# Patient Record
Sex: Female | Born: 1965 | Race: White | Hispanic: No | Smoking: Never smoker
Health system: Southern US, Community
[De-identification: ages and names within clinical notes are randomized; demographics above are authoritative.]

## PROBLEM LIST (undated history)

## (undated) DIAGNOSIS — I10 Essential (primary) hypertension: Secondary | ICD-10-CM

## (undated) DIAGNOSIS — K5010 Crohn's disease of large intestine without complications: Secondary | ICD-10-CM

## (undated) DIAGNOSIS — E66813 Obesity, class 3: Secondary | ICD-10-CM

## (undated) DIAGNOSIS — K5000 Crohn's disease of small intestine without complications: Secondary | ICD-10-CM

## (undated) DIAGNOSIS — E785 Hyperlipidemia, unspecified: Secondary | ICD-10-CM

## (undated) DIAGNOSIS — R0600 Dyspnea, unspecified: Secondary | ICD-10-CM

## (undated) DIAGNOSIS — K5790 Diverticulosis of intestine, part unspecified, without perforation or abscess without bleeding: Secondary | ICD-10-CM

## (undated) DIAGNOSIS — E119 Type 2 diabetes mellitus without complications: Secondary | ICD-10-CM

## (undated) HISTORY — DX: Essential (primary) hypertension: I10

## (undated) HISTORY — PX: COLONOSCOPY: SHX174

## (undated) HISTORY — DX: Hyperlipidemia, unspecified: E78.5

## (undated) HISTORY — PX: LAPAROSCOPIC GASTRIC BANDING: SHX1100

## (undated) HISTORY — DX: Crohn's disease of small intestine without complications: K50.00

## (undated) HISTORY — PX: UMBILICAL HERNIA REPAIR: SUR1181

## (undated) HISTORY — PX: ABDOMINAL EXPLORATION SURGERY: SHX538

## (undated) HISTORY — PX: UPPER GASTROINTESTINAL ENDOSCOPY: SHX188

## (undated) HISTORY — DX: Morbid (severe) obesity due to excess calories: E66.01

## (undated) HISTORY — DX: Type 2 diabetes mellitus without complications: E11.9

## (undated) HISTORY — DX: Obesity, class 3: E66.813

## (undated) HISTORY — PX: CHOLECYSTECTOMY: SHX55

## (undated) HISTORY — DX: Diverticulosis of intestine, part unspecified, without perforation or abscess without bleeding: K57.90

---

## 2007-05-17 ENCOUNTER — Ambulatory Visit: Payer: Self-pay | Admitting: Cardiology

## 2008-06-05 ENCOUNTER — Ambulatory Visit (HOSPITAL_COMMUNITY): Admission: RE | Admit: 2008-06-05 | Discharge: 2008-06-05 | Payer: Self-pay | Admitting: *Deleted

## 2008-06-26 ENCOUNTER — Encounter: Admission: RE | Admit: 2008-06-26 | Discharge: 2008-06-26 | Payer: Self-pay | Admitting: *Deleted

## 2008-06-26 ENCOUNTER — Ambulatory Visit (HOSPITAL_COMMUNITY): Admission: RE | Admit: 2008-06-26 | Discharge: 2008-06-26 | Payer: Self-pay | Admitting: *Deleted

## 2009-04-09 ENCOUNTER — Encounter: Admission: RE | Admit: 2009-04-09 | Discharge: 2009-07-08 | Payer: Self-pay | Admitting: Surgery

## 2009-04-26 ENCOUNTER — Emergency Department (HOSPITAL_COMMUNITY): Admission: EM | Admit: 2009-04-26 | Discharge: 2009-04-26 | Payer: Self-pay | Admitting: Emergency Medicine

## 2009-04-28 ENCOUNTER — Ambulatory Visit (HOSPITAL_COMMUNITY): Admission: RE | Admit: 2009-04-28 | Discharge: 2009-04-29 | Payer: Self-pay | Admitting: Surgery

## 2009-05-05 ENCOUNTER — Inpatient Hospital Stay (HOSPITAL_COMMUNITY): Admission: EM | Admit: 2009-05-05 | Discharge: 2009-05-08 | Payer: Self-pay | Admitting: Emergency Medicine

## 2009-06-19 ENCOUNTER — Emergency Department (HOSPITAL_COMMUNITY): Admission: EM | Admit: 2009-06-19 | Discharge: 2009-06-19 | Payer: Self-pay | Admitting: Emergency Medicine

## 2009-07-28 ENCOUNTER — Encounter: Admission: RE | Admit: 2009-07-28 | Discharge: 2009-09-16 | Payer: Self-pay | Admitting: Surgery

## 2009-08-05 ENCOUNTER — Encounter: Admission: RE | Admit: 2009-08-05 | Discharge: 2009-08-05 | Payer: Self-pay | Admitting: Gastroenterology

## 2010-03-28 ENCOUNTER — Emergency Department (HOSPITAL_COMMUNITY): Admission: EM | Admit: 2010-03-28 | Discharge: 2010-03-29 | Payer: Self-pay | Admitting: Emergency Medicine

## 2010-06-19 DIAGNOSIS — K5790 Diverticulosis of intestine, part unspecified, without perforation or abscess without bleeding: Secondary | ICD-10-CM

## 2010-06-19 HISTORY — DX: Diverticulosis of intestine, part unspecified, without perforation or abscess without bleeding: K57.90

## 2010-06-19 HISTORY — PX: COLONOSCOPY: SHX174

## 2010-06-23 ENCOUNTER — Inpatient Hospital Stay (HOSPITAL_COMMUNITY): Admission: EM | Admit: 2010-06-23 | Discharge: 2010-06-26 | Payer: Self-pay | Admitting: Emergency Medicine

## 2010-06-24 ENCOUNTER — Ambulatory Visit: Payer: Self-pay | Admitting: Gastroenterology

## 2010-06-24 ENCOUNTER — Encounter (INDEPENDENT_AMBULATORY_CARE_PROVIDER_SITE_OTHER): Payer: Self-pay

## 2010-06-24 ENCOUNTER — Ambulatory Visit: Payer: Self-pay | Admitting: Internal Medicine

## 2010-07-26 ENCOUNTER — Encounter (INDEPENDENT_AMBULATORY_CARE_PROVIDER_SITE_OTHER): Payer: Self-pay | Admitting: *Deleted

## 2010-10-19 NOTE — Miscellaneous (Signed)
Summary: CONSULTATION  Clinical Lists Changes  NAME:  [name] [name]                ACCOUNT NO.:  [id number]      MEDICAL RECORD NO.:  [id number]          PATIENT TYPE:  INP      LOCATION:  A334                          FACILITY:  [facility]      PHYSICIAN:  R. Roetta Sessions, M.D. DATE OF BIRTH:  17-Mar-1966      DATE OF CONSULTATION:  06/23/2010   DATE OF DISCHARGE:                                    CONSULTATION         REASON FOR CONSULTATION:  Ileitis.      PHYSICIAN REQUESTING CONSULTATION:  Elliot Cousin with [facility]   1 team.      PRIMARY CARE PHYSICIAN:  Dr. Sherryll Burger.      HISTORY OF PRESENT ILLNESS:  The patient is a 45 year old Caucasian   female with past medical history significant for laparoscopic gastric   band placement for weight loss back on April 28, 2009, status post   removal of incarcerated omentum stuck to the umbilical hernia on May 05, 2009, type 2 diabetes, hypertension, who presents the emergency   department with complaints of recurrent right-sided abdominal pain,   nausea and dry heaves, diarrhea.  The patient has had intermittent   episodes of this for over 1 year.  She tells me actually before her   gastric band placement that she had an exploratory laparoscopy by Dr.   Luretha Murphy for similar symptoms.  I do not have that report.  The   patient tells me that he found some inflamed of bowel which was felt to   be due to infection an at the time.  She has also had a total of four   CTs since August 2010 now, which demonstrate ileitis.  She had a   colonoscopy and EGD by Dr. Willis Modena back in December 2010 due to   these abnormal findings on the CT.  She found have diverticulosis of the   entire colon.  The report is somewhat hard to read but the distal   terminal ileum was evaluated appear to be normal.  Not sure for into the   distal terminal ileum was seen.  There was no findings to correlate with   the CT findings, however.  She had a  normal EGD to the third portion of   the duodenum.  The patient states that and 20 episode she does very well   without any abdominal pain or bowel issues.  She had normal.  She had   another episode back in July and she presented to the ED and an AP and   had another CT and again showed ileitis.  She believes she was treated   at one point with antibiotic therapy.      Basically she develops a right lower quadrant abdominal pain followed by   nausea and vomiting as well as diarrhea.  This time she has had dry   heaves has not been able to keep anything down or even attempt to take   p.o.'s.  She has had 2 to 3 loose stools daily.  Denies any blood in the   stool or melena.  On evaluation she had a CT of the head and pelvis with   contrast that showed inflammatory process at the terminal ileum with   bowel wall thickening, mesenteric edema, free intraperitoneal fluid, and   similar to prior study back in July.  Her white count slightly elevated   at 12,300.  Her hemoglobin was normal and lactic acid was 2.6 slightly   elevated.  Her glucose is 280.  Her LFTs were normal except albumin of   3.1, lipase normal.  HCG urine pregnancy test was negative.      MEDICATIONS AT HOME:   1. Lantus 26 units daily   2. Lisinopril 20 mg daily   3. Vitamin D 2000 units daily   4. Oral contraceptive pill daily.   5. Denies any NSAID or aspirin use.      ALLERGIES:  NO KNOWN DRUG ALLERGIES.      PAST MEDICAL HISTORY:   1. History of distal ileitis as outlined above.   2. Diabetes mellitus.   3. Hypertension.   4. Hyperlipidemia.   5. Vitamin D deficiency.   6. Again she reports an exploratory laparoscopy around April 26, 2009,       and reports having inflamed intestines at that time.  She had a lap       band placement on April 28, 2009.  On May 05, 2009, she had       open repair of an incarcerated umbilical hernia.  She had a remote       cholecystectomy in 1992.  EGD and colonoscopy in  December 2010 by       Dr. Dulce Sellar as outlined above.      FAMILY HISTORY:  Mother 66 and has a history diabetes, thyroid disease,   hypertension, hyperlipidemia and GERD.  Father is 50 and has history of   MI and hypertension.  No family history of colon cancer, IBD or liver   disease.      SOCIAL HISTORY:  She is married.  No children.  Denies tobacco, alcohol   or drug use.  She is employed with [employer].      REVIEW OF SYSTEMS:  As outlined above.  GENERAL:  She has lost about 85   pounds intentionally since a lap band placement.  CARDIOPULMONARY:   Denies chest pain, shortness of breath, palpitations or cough.   GENITOURINARY:  Denies any dysuria, hematuria.      PHYSICAL EXAM:  VITAL SIGNS:  Temperature 90.3, pulse 80, respirations   13, blood pressure 167/98.   GENERAL:  Pleasant obese Caucasian female in no acute distress.   SKIN:  Warm and dry.  No jaundice.   HEENT:  Sclerae nonicteric.  Oropharyngeal mucosa moist and pink.  No   lesions, erythema or exudate.  No lymphadenopathy, thyromegaly.  CHEST:   Lungs are clear to auscultation.   CARDIAC:  Exam reveals regular rate and rhythm.  No murmurs, rubs or   gallops.   ABDOMEN:  Obese, positive bowel sounds.  Lab band port notable above the   umbilicus and right of the midline.  She has tenderness throughout the   right mid to right lower quadrant.  No rebound or guarding.  No   organomegaly or masses.  No abdominal bruits or hernias.   LOWER EXTREMITIES:  No edema.      LABS:  As outlined above.  CT as outlined above.      IMPRESSION:  The patient is a 45 year old Caucasian female with history   of chronic ileitis seen on four CTs now since August of 2010 which   likely represents evolving Crohn disease.      RECOMMENDATIONS:  Colonoscopy with ileoscopy in the morning:  I like to   thank Dr. Sherrie Mustache for allowing me to take part in the care of this   patient.               Tana Coast, P.A.          ______________________________   R. Roetta Sessions, M.D.            LL/MEDQ  D:  06/23/2010  T:  06/23/2010  Job:  [id number]      cc:   Elliot Cousin, M.D.      Thornton Park Daphine Deutscher, MD   [address]   [place]   [place] [address]      Dr. Sherryll Burger      Electronically Signed by Tana Coast P.A. on 06/30/2010 08:05:58 AM   Electronically Signed by Lorrin Goodell M.D. on 07/26/2010 11:17:13 AM

## 2010-12-02 LAB — URINALYSIS, ROUTINE W REFLEX MICROSCOPIC
Ketones, ur: >80 mg/dL — AB
Nitrite: NEGATIVE
Protein, ur: NEGATIVE mg/dL
Urobilinogen, UA: 0.2 mg/dL (ref 0.0–1.0)

## 2010-12-02 LAB — CBC
Hemoglobin: 11 g/dL — ABNORMAL LOW (ref 12.0–15.0)
MCH: 28.5 pg (ref 26.0–34.0)
MCHC: 33.5 g/dL (ref 30.0–36.0)
MCHC: 33.7 g/dL (ref 30.0–36.0)
MCV: 84.4 fL (ref 78.0–100.0)
Platelets: 192 10*3/uL (ref 150–400)
RDW: 12.7 % (ref 11.5–15.5)
WBC: 8.9 10*3/uL (ref 4.0–10.5)

## 2010-12-02 LAB — GIARDIA/CRYPTOSPORIDIUM SCREEN(EIA)
Cryptosporidium Screen (EIA): NEGATIVE
Giardia Screen - EIA: NEGATIVE

## 2010-12-02 LAB — GLUCOSE, CAPILLARY
Glucose-Capillary: 122 mg/dL — ABNORMAL HIGH (ref 70–99)
Glucose-Capillary: 132 mg/dL — ABNORMAL HIGH (ref 70–99)
Glucose-Capillary: 155 mg/dL — ABNORMAL HIGH (ref 70–99)
Glucose-Capillary: 166 mg/dL — ABNORMAL HIGH (ref 70–99)
Glucose-Capillary: 205 mg/dL — ABNORMAL HIGH (ref 70–99)
Glucose-Capillary: 211 mg/dL — ABNORMAL HIGH (ref 70–99)
Glucose-Capillary: 235 mg/dL — ABNORMAL HIGH (ref 70–99)
Glucose-Capillary: 237 mg/dL — ABNORMAL HIGH (ref 70–99)
Glucose-Capillary: 263 mg/dL — ABNORMAL HIGH (ref 70–99)

## 2010-12-02 LAB — DIFFERENTIAL
Basophils Absolute: 0 10*3/uL (ref 0.0–0.1)
Basophils Relative: 0 % (ref 0–1)
Basophils Relative: 0 % (ref 0–1)
Eosinophils Absolute: 0.2 10*3/uL (ref 0.0–0.7)
Eosinophils Relative: 3 % (ref 0–5)
Lymphocytes Relative: 17 % (ref 12–46)
Lymphocytes Relative: 23 % (ref 12–46)
Monocytes Absolute: 0.4 10*3/uL (ref 0.1–1.0)
Monocytes Relative: 5 % (ref 3–12)
Monocytes Relative: 6 % (ref 3–12)
Neutro Abs: 6.2 10*3/uL (ref 1.7–7.7)
Neutro Abs: 9.4 10*3/uL — ABNORMAL HIGH (ref 1.7–7.7)
Neutrophils Relative %: 63 % (ref 43–77)
Neutrophils Relative %: 69 % (ref 43–77)
Neutrophils Relative %: 76 % (ref 43–77)

## 2010-12-02 LAB — BASIC METABOLIC PANEL
BUN: 10 mg/dL (ref 6–23)
BUN: 4 mg/dL — ABNORMAL LOW (ref 6–23)
BUN: 4 mg/dL — ABNORMAL LOW (ref 6–23)
CO2: 20 mEq/L (ref 19–32)
CO2: 25 mEq/L (ref 19–32)
Calcium: 8.2 mg/dL — ABNORMAL LOW (ref 8.4–10.5)
Calcium: 8.4 mg/dL (ref 8.4–10.5)
Chloride: 104 mEq/L (ref 96–112)
Creatinine, Ser: 0.63 mg/dL (ref 0.4–1.2)
Creatinine, Ser: 0.64 mg/dL (ref 0.4–1.2)
Creatinine, Ser: 0.75 mg/dL (ref 0.4–1.2)
GFR calc Af Amer: >60 mL/min (ref >60)
GFR calc non Af Amer: >60 mL/min (ref >60)
Glucose, Bld: 131 mg/dL — ABNORMAL HIGH (ref 70–99)
Potassium: 3.6 mEq/L (ref 3.5–5.1)
Sodium: 140 mEq/L (ref 135–145)

## 2010-12-02 LAB — HEPATIC FUNCTION PANEL
ALT: 10 U/L (ref 0–35)
AST: 17 U/L (ref 0–37)
Albumin: 3.1 g/dL — ABNORMAL LOW (ref 3.5–5.2)
Total Bilirubin: 0.7 mg/dL (ref 0.3–1.2)

## 2010-12-02 LAB — HEMOGLOBIN A1C: Mean Plasma Glucose: 209 mg/dL — ABNORMAL HIGH (ref <117)

## 2010-12-02 LAB — MAGNESIUM
Magnesium: 1.6 mg/dL (ref 1.5–2.5)
Magnesium: 1.9 mg/dL (ref 1.5–2.5)

## 2010-12-02 LAB — T4, FREE: Free T4: 1.33 ng/dL (ref 0.80–1.80)

## 2010-12-02 LAB — PREGNANCY, URINE: Preg Test, Ur: NEGATIVE

## 2010-12-05 LAB — CBC
HCT: 37.2 % (ref 36.0–46.0)
MCV: 83.5 fL (ref 78.0–100.0)
Platelets: 226 10*3/uL (ref 150–400)
RBC: 4.46 MIL/uL (ref 3.87–5.11)
WBC: 13.4 10*3/uL — ABNORMAL HIGH (ref 4.0–10.5)

## 2010-12-05 LAB — URINE MICROSCOPIC-ADD ON

## 2010-12-05 LAB — URINALYSIS, ROUTINE W REFLEX MICROSCOPIC
Bilirubin Urine: NEGATIVE
Glucose, UA: >1000 mg/dL — AB
Hgb urine dipstick: NEGATIVE
Protein, ur: NEGATIVE mg/dL
Urobilinogen, UA: 0.2 mg/dL (ref 0.0–1.0)

## 2010-12-05 LAB — COMPREHENSIVE METABOLIC PANEL
ALT: 9 U/L (ref 0–35)
Albumin: 2.8 g/dL — ABNORMAL LOW (ref 3.5–5.2)
Alkaline Phosphatase: 63 U/L (ref 39–117)
BUN: 8 mg/dL (ref 6–23)
Chloride: 101 mEq/L (ref 96–112)
Potassium: 3.8 mEq/L (ref 3.5–5.1)
Sodium: 132 mEq/L — ABNORMAL LOW (ref 135–145)
Total Bilirubin: 0.8 mg/dL (ref 0.3–1.2)
Total Protein: 6 g/dL (ref 6.0–8.3)

## 2010-12-05 LAB — DIFFERENTIAL
Basophils Absolute: 0 10*3/uL (ref 0.0–0.1)
Basophils Relative: 0 % (ref 0–1)
Eosinophils Absolute: 0 10*3/uL (ref 0.0–0.7)
Eosinophils Relative: 0 % (ref 0–5)
Monocytes Absolute: 0.4 10*3/uL (ref 0.1–1.0)
Monocytes Relative: 3 % (ref 3–12)
Neutro Abs: 11.6 10*3/uL — ABNORMAL HIGH (ref 1.7–7.7)

## 2010-12-23 LAB — CBC
HCT: 39.7 % (ref 36.0–46.0)
MCHC: 33.5 g/dL (ref 30.0–36.0)
MCV: 87.3 fL (ref 78.0–100.0)
Platelets: 231 10*3/uL (ref 150–400)
RDW: 12.8 % (ref 11.5–15.5)
WBC: 14.8 10*3/uL — ABNORMAL HIGH (ref 4.0–10.5)

## 2010-12-23 LAB — URINALYSIS, ROUTINE W REFLEX MICROSCOPIC
Bilirubin Urine: NEGATIVE
Hgb urine dipstick: NEGATIVE
Nitrite: NEGATIVE
Protein, ur: NEGATIVE mg/dL
Urobilinogen, UA: 0.2 mg/dL (ref 0.0–1.0)

## 2010-12-23 LAB — GLUCOSE, CAPILLARY: Glucose-Capillary: 229 mg/dL — ABNORMAL HIGH (ref 70–99)

## 2010-12-23 LAB — COMPREHENSIVE METABOLIC PANEL
AST: 18 U/L (ref 0–37)
Albumin: 3 g/dL — ABNORMAL LOW (ref 3.5–5.2)
BUN: 9 mg/dL (ref 6–23)
Calcium: 8.6 mg/dL (ref 8.4–10.5)
Chloride: 106 mEq/L (ref 96–112)
Creatinine, Ser: 0.67 mg/dL (ref 0.4–1.2)
GFR calc Af Amer: >60 mL/min (ref >60)
Total Bilirubin: 0.6 mg/dL (ref 0.3–1.2)

## 2010-12-23 LAB — DIFFERENTIAL
Basophils Absolute: 0 10*3/uL (ref 0.0–0.1)
Eosinophils Relative: 0 % (ref 0–5)
Lymphocytes Relative: 9 % — ABNORMAL LOW (ref 12–46)
Lymphs Abs: 1.3 10*3/uL (ref 0.7–4.0)
Monocytes Absolute: 0.3 10*3/uL (ref 0.1–1.0)
Neutro Abs: 13.2 10*3/uL — ABNORMAL HIGH (ref 1.7–7.7)

## 2010-12-23 LAB — LIPASE, BLOOD: Lipase: 16 U/L (ref 11–59)

## 2010-12-23 LAB — PREGNANCY, URINE: Preg Test, Ur: NEGATIVE

## 2010-12-25 LAB — GLUCOSE, CAPILLARY
Glucose-Capillary: 143 mg/dL — ABNORMAL HIGH (ref 70–99)
Glucose-Capillary: 153 mg/dL — ABNORMAL HIGH (ref 70–99)
Glucose-Capillary: 156 mg/dL — ABNORMAL HIGH (ref 70–99)
Glucose-Capillary: 157 mg/dL — ABNORMAL HIGH (ref 70–99)
Glucose-Capillary: 160 mg/dL — ABNORMAL HIGH (ref 70–99)
Glucose-Capillary: 162 mg/dL — ABNORMAL HIGH (ref 70–99)
Glucose-Capillary: 163 mg/dL — ABNORMAL HIGH (ref 70–99)
Glucose-Capillary: 172 mg/dL — ABNORMAL HIGH (ref 70–99)
Glucose-Capillary: 174 mg/dL — ABNORMAL HIGH (ref 70–99)
Glucose-Capillary: 180 mg/dL — ABNORMAL HIGH (ref 70–99)
Glucose-Capillary: 214 mg/dL — ABNORMAL HIGH (ref 70–99)
Glucose-Capillary: 142 mg/dL — ABNORMAL HIGH (ref 70–99)
Glucose-Capillary: 172 mg/dL — ABNORMAL HIGH (ref 70–99)
Glucose-Capillary: 178 mg/dL — ABNORMAL HIGH (ref 70–99)
Glucose-Capillary: 199 mg/dL — ABNORMAL HIGH (ref 70–99)
Glucose-Capillary: 233 mg/dL — ABNORMAL HIGH (ref 70–99)

## 2010-12-25 LAB — CBC
HCT: 31 % — ABNORMAL LOW (ref 36.0–46.0)
HCT: 32.8 % — ABNORMAL LOW (ref 36.0–46.0)
HCT: 31.5 % — ABNORMAL LOW (ref 36.0–46.0)
HCT: 36.2 % (ref 36.0–46.0)
Hemoglobin: 10.6 g/dL — ABNORMAL LOW (ref 12.0–15.0)
Hemoglobin: 11.1 g/dL — ABNORMAL LOW (ref 12.0–15.0)
Hemoglobin: 11.1 g/dL — ABNORMAL LOW (ref 12.0–15.0)
Hemoglobin: 15 g/dL (ref 12.0–15.0)
Hemoglobin: 10.7 g/dL — ABNORMAL LOW (ref 12.0–15.0)
Hemoglobin: 12.1 g/dL (ref 12.0–15.0)
MCHC: 33.7 g/dL (ref 30.0–36.0)
MCHC: 33.9 g/dL (ref 30.0–36.0)
MCHC: 33.9 g/dL (ref 30.0–36.0)
MCV: 87.6 fL (ref 78.0–100.0)
Platelets: 206 10*3/uL (ref 150–400)
Platelets: 213 10*3/uL (ref 150–400)
Platelets: 209 10*3/uL (ref 150–400)
RBC: 3.48 MIL/uL — ABNORMAL LOW (ref 3.87–5.11)
RBC: 5.24 MIL/uL — ABNORMAL HIGH (ref 3.87–5.11)
RDW: 13.7 % (ref 11.5–15.5)
RDW: 14.3 % (ref 11.5–15.5)
RDW: 13.8 % (ref 11.5–15.5)
WBC: 19.1 10*3/uL — ABNORMAL HIGH (ref 4.0–10.5)
WBC: 11.4 10*3/uL — ABNORMAL HIGH (ref 4.0–10.5)
WBC: 9.6 10*3/uL (ref 4.0–10.5)

## 2010-12-25 LAB — URINALYSIS, ROUTINE W REFLEX MICROSCOPIC
Ketones, ur: >80 mg/dL — AB
Nitrite: NEGATIVE
pH: 5.5 (ref 5.0–8.0)

## 2010-12-25 LAB — DIFFERENTIAL
Basophils Absolute: 0 10*3/uL (ref 0.0–0.1)
Basophils Absolute: 0 10*3/uL (ref 0.0–0.1)
Basophils Absolute: 0 10*3/uL (ref 0.0–0.1)
Basophils Relative: 0 % (ref 0–1)
Basophils Relative: 0 % (ref 0–1)
Eosinophils Absolute: 0.1 10*3/uL (ref 0.0–0.7)
Eosinophils Relative: 0 % (ref 0–5)
Eosinophils Relative: 0 % (ref 0–5)
Eosinophils Relative: 1 % (ref 0–5)
Eosinophils Relative: 1 % (ref 0–5)
Lymphocytes Relative: 15 % (ref 12–46)
Lymphocytes Relative: 21 % (ref 12–46)
Lymphocytes Relative: 26 % (ref 12–46)
Lymphs Abs: 1.3 10*3/uL (ref 0.7–4.0)
Lymphs Abs: 2.4 10*3/uL (ref 0.7–4.0)
Lymphs Abs: 2.5 10*3/uL (ref 0.7–4.0)
Monocytes Absolute: 0.6 10*3/uL (ref 0.1–1.0)
Monocytes Absolute: 0.6 10*3/uL (ref 0.1–1.0)
Monocytes Absolute: 0.6 10*3/uL (ref 0.1–1.0)
Monocytes Relative: 5 % (ref 3–12)
Monocytes Relative: 6 % (ref 3–12)
Neutro Abs: 3.8 10*3/uL (ref 1.7–7.7)
Neutrophils Relative %: 58 % (ref 43–77)

## 2010-12-25 LAB — COMPREHENSIVE METABOLIC PANEL
ALT: 34 U/L (ref 0–35)
AST: 24 U/L (ref 0–37)
AST: 38 U/L — ABNORMAL HIGH (ref 0–37)
Alkaline Phosphatase: 86 U/L (ref 39–117)
CO2: 17 mEq/L — ABNORMAL LOW (ref 19–32)
CO2: 24 mEq/L (ref 19–32)
Calcium: 8.8 mg/dL (ref 8.4–10.5)
Chloride: 101 mEq/L (ref 96–112)
Creatinine, Ser: 0.59 mg/dL (ref 0.4–1.2)
GFR calc Af Amer: >60 mL/min (ref >60)
GFR calc Af Amer: >60 mL/min (ref >60)
GFR calc non Af Amer: >60 mL/min (ref >60)
GFR calc non Af Amer: >60 mL/min (ref >60)
Potassium: 3.9 mEq/L (ref 3.5–5.1)
Sodium: 138 mEq/L (ref 135–145)
Sodium: 136 mEq/L (ref 135–145)
Total Bilirubin: 1.4 mg/dL — ABNORMAL HIGH (ref 0.3–1.2)
Total Protein: 6.4 g/dL (ref 6.0–8.3)

## 2010-12-25 LAB — BASIC METABOLIC PANEL
BUN: 2 mg/dL — ABNORMAL LOW (ref 6–23)
BUN: <1 mg/dL — ABNORMAL LOW (ref 6–23)
CO2: 26 mEq/L (ref 19–32)
CO2: 26 mEq/L (ref 19–32)
Calcium: 7.6 mg/dL — ABNORMAL LOW (ref 8.4–10.5)
Calcium: 7.8 mg/dL — ABNORMAL LOW (ref 8.4–10.5)
Calcium: 7.8 mg/dL — ABNORMAL LOW (ref 8.4–10.5)
Chloride: 105 mEq/L (ref 96–112)
Creatinine, Ser: 0.52 mg/dL (ref 0.4–1.2)
GFR calc Af Amer: >60 mL/min (ref >60)
GFR calc non Af Amer: >60 mL/min (ref >60)
GFR calc non Af Amer: >60 mL/min (ref >60)
GFR calc non Af Amer: >60 mL/min (ref >60)
Glucose, Bld: 154 mg/dL — ABNORMAL HIGH (ref 70–99)
Glucose, Bld: 178 mg/dL — ABNORMAL HIGH (ref 70–99)
Potassium: 3.5 mEq/L (ref 3.5–5.1)
Potassium: 3.2 mEq/L — ABNORMAL LOW (ref 3.5–5.1)
Sodium: 138 mEq/L (ref 135–145)
Sodium: 142 mEq/L (ref 135–145)
Sodium: 136 mEq/L (ref 135–145)

## 2010-12-25 LAB — LIPASE, BLOOD: Lipase: 11 U/L (ref 11–59)

## 2010-12-25 LAB — URINE MICROSCOPIC-ADD ON

## 2010-12-25 LAB — PREGNANCY, URINE: Preg Test, Ur: NEGATIVE

## 2011-02-01 NOTE — Discharge Summary (Signed)
NAME:  [name], [name]                ACCOUNT NO.:  [id number]   MEDICAL RECORD NO.:  [id number]          PATIENT TYPE:  INP   LOCATION:  1529                         FACILITY:  [facility]   PHYSICIAN:  Thornton Park. Daphine Deutscher, MD  DATE OF BIRTH:  01-Oct-1965   DATE OF ADMISSION:  05/05/2009  DATE OF DISCHARGE:  05/08/2009                               DISCHARGE SUMMARY   ADMITTING DIAGNOSIS:  Distal ileitis.   DISCHARGE DIAGNOSIS:  Probable gastroenteritis involving the terminal  ileum.   HISTORY OF PRESENT ILLNESS:  [name] [name] is a 45 year old lady who is  one week status post a lap band APL and had uneventful surgery.  She  came back to the emergency room with nausea, vomiting, diarrhea similar  to events that she had 4 days prior to her surgery.  CT scan showed  thickened small bowel loops in the right lower quadrant with probable  enteritis, but there was some question there might be an internal  hernia.  She was seen in the ED, and we felt that we would go ahead and  proceed to the OR for laparoscopy.   COURSE IN THE HOSPITAL:  She went to the OR, and we performed  laparoscopy, found these dilated loops and  thickened areas of the  terminal ileum consistent with gastroenteritis.  In addition, I repaired  an incarcerated umbilical hernia that was incarcerated with omentum.  Postoperatively, she did well and was kept on liquids.  She was ready  for discharge on May 08, 2009, to take Cipro and Flagyl at least for  a week and follow up in the office.   CONDITION:  Improved.      Thornton Park Daphine Deutscher, MD  Electronically Signed     MBM/MEDQ  D:  05/19/2009  T:  05/19/2009  Job:  [id number]

## 2011-02-01 NOTE — Op Note (Signed)
NAME:  [name], [name]                ACCOUNT NO.:  [id number]   MEDICAL RECORD NO.:  [id number]          PATIENT TYPE:  INP   LOCATION:  1529                         FACILITY:  [facility]   PHYSICIAN:  Thornton Park. Daphine Deutscher, MD  DATE OF BIRTH:  April 13, 1966   DATE OF PROCEDURE:  05/05/2009  DATE OF DISCHARGE:                               OPERATIVE REPORT   PREOPERATIVE DIAGNOSIS:  This is a 45 year old white female who is about  a week out from laparoscopic adjustable gastric banding APL system.  Before her original surgery, she had some episodes of lower abdominal  pain on the right with some diarrhea that was seen in the ED and worked  up and with a negative workup and it apparently had gotten better.  When  I saw her preoperatively she did not have any complaints that I was made  aware of, but was ready for surgery.  __________ was done without  difficulty.  At the time of the surgery I noted she had an incarcerated  umbilical hernia containing only omentum and it was not obstructing.  I  therefore left it in place at that time.   She said when she came in today to the emergency room she had a  recurrence of this lower abdominal pain with diarrhea and some nausea  and vomiting that occurred and said it was very much like the pain she  had preop.  CT scan showed some thickened loops of small bowel in the  terminal ileum and there was a question of whether she had an internal  hernia with obstruction.  The other thought was this was some kind of a  gastroenteritis.  Informed consent was obtained regarding laparoscopy,  laparotomy, possible repair of hernia.   SURGEON:  Luretha Murphy, MD.   ANESTHESIA:  General endotracheal.   The patient was taken to room 1, given general anesthesia.  The abdomen  was prepped with a Techni-Care equivalent and draped sterilely.  I  entered the abdomen using an Optiview through the right upper quadrant  without difficulty and insufflated.  I then placed 2  trocars over on the  right side.  I moved the patient into the head down position and also  rotated her to the left.  She did shift a bit on the table, which we had  to reposition intraoperatively without difficulty.  I was able to get to  the terminal ileum and saw the inflamed loops of bowel.  This did not  appear to be Crohn disease.  In order to make sure I had complete  visualization, I went ahead and used a Harmonic scalpel and took down  the incarcerated omentum stuck up in this umbilical hernia.  When that  was taken down, I rotated the omentum in a cephalad position over the  liver and then looked at the terminal ileum and ran the ileum from the  ileocecal valve back through the area of inflammation, taking some  pictures of that, and to an area of just normal decompressed-appearing  bowel.  The bowel itself appeared to be inflamed with a  serositis and  there was fluid in that area that was consistent with serositis fluid  but no focal areas suggesting a bowel obstruction; only possibly  gastroenteritis.  After evacuating  this fluid, I went ahead and  irrigated and then surveyed the abdomen, looking up her band.   Because of the umbilical failed hernia from her previous cholecystectomy  site and because one of her symptoms had been nausea and vomiting, I  elected to go and repair the hernia at this time.  I made an  infraumbilical incision, cut down on it and dissected out the sac.  Once  entered, I brought out a wad of omentum.  It was about the size of a  handball, and I removed that completely.  The actual defect was only  about the size of a half dollar to a quarter, and I repaired it  primarily with 5 sutures of #1 Novofil, tying these down.  I then put  the scope back in, reinflated,  looked around the abdomen.  The hernia repair appeared intact and  everything appeared as it had been before.  I injected the wounds with  0.5% Marcaine and closed them with 4-0 Vicryl with  benzoin and Steri-  Strips.  The patient tolerated the procedure well and was taken to the  recovery room in satisfactory condition.      Thornton Park Daphine Deutscher, MD  Electronically Signed     MBM/MEDQ  D:  05/05/2009  T:  05/05/2009  Job:  [id number]

## 2011-02-01 NOTE — Discharge Summary (Signed)
NAME:  [name], [name]                ACCOUNT NO.:  [id number]   MEDICAL RECORD NO.:  [id number]          PATIENT TYPE:  INP   LOCATION:  1529                         FACILITY:  [facility]   PHYSICIAN:  Thornton Park. Daphine Deutscher, MD  DATE OF BIRTH:  February 28, 1966   DATE OF ADMISSION:  05/05/2009  DATE OF DISCHARGE:  05/08/2009                               DISCHARGE SUMMARY   ADMISSION DIAGNOSIS:  Ileal gastroenteritis, rule out internal hernia on  CT scan for abdominal pain, nausea, vomiting.   DISCHARGE DIAGNOSIS:  Ileal enteritis, etiology uncertain.   PROCEDURE:  Laparoscopy, open repair of incarcerated umbilical hernia.   SURGEON:  Thornton Park. Daphine Deutscher, MD   COURSE IN THE HOSPITAL:  [name] came back 1 week after her lap  band.  Prior to her lap band, she had actually had some intermittent  episodes of nausea, vomiting, diarrhea.  She stated that this was  something that had come back and was worse than it was preoperative,  although very similar.  CT scan showed some thickened loops of small  bowel on the right lower quadrant and some question of an internal  hernia.   She was taken to the operating room where thickened loops of bowel were  found, but  there was no evidence of obstruction.  It looked like a  primary inflammatory condition with serositis.  We previously had  omentum incarcerated up into an umbilical hernia which had been repaired  in the past and had recurred.  We had elected to leave the omentum as a  plug, but at this point, I had resected the omentum, divided it and left  the plug in there, but I decided because of her condition, that it would  be best to go ahead and at least do a primary repair at this time.  I  went ahead and did an infraumbilical incision, cut down on this, removed  the sack and removed a large incarcerated wad of omentum.  I then  repaired this transverse with multiple number 1 Novofil sutures.   Postoperatively, she felt better, but still with a  little nausea  initially.  I put her on Cipro 400 b.i.d. IV.  Then as she was able to  take liquids, I offered her Flagyl 250 t.i.d. orally.  She was taking  liquids.  I noted her K was down at the time of discharge.  We are going  to give her 10 mEq of potassium chloride to take twice a day and to take  her Cipro and Flagyl daily.  I will see her back in the office in 1  week.  She seemed to be much more comfortable and doing well.  Vital  signs had remained stable.  No evidence of fevers.  Her blood sugars ran  a little bit high, but were acceptable.   PLAN:  Return to the office in 1 week.      Thornton Park Daphine Deutscher, MD  Electronically Signed     MBM/MEDQ  D:  05/08/2009  T:  05/08/2009  Job:  [id number]

## 2011-02-01 NOTE — Op Note (Signed)
NAME:  [name], [name]                ACCOUNT NO.:  [id number]   MEDICAL RECORD NO.:  [id number]          PATIENT TYPE:  AMB   LOCATION:  DAY                          FACILITY:  [facility]   PHYSICIAN:  Thornton Park. Daphine Deutscher, MD  DATE OF BIRTH:  [birth year]   DATE OF PROCEDURE:  DATE OF DISCHARGE:                               OPERATIVE REPORT   PREOPERATIVE DIAGNOSIS:  Morbid obesity BMI of 50 with no  gastroesophageal reflux disease, insulin dependent diabetes,  hypertension, depression.   PROCEDURE:  Laparoscopic adjustable gastric band (Allergan APL).   POSTOPERATIVE DIAGNOSES:  Morbid obesity BMI 50, status post a lap band  APL with incarcerated umbilical hernia.   ASSISTANT:  Ovidio Kin, MD   ANESTHESIA:  General endotracheal.   DESCRIPTION OF PROCEDURE:  [name] is a 45 year old white female  taken to room 1 on April 28, 2009, given general anesthesia.  The  abdomen was prepped with a Techni-Care equivalent and draped sterilely.  Access to the abdomen was achieved through the left upper quadrant using  a 0-degree 12-mm Optiview without difficulty.  The abdomen was  insufflated and then standard trocar placements were done.  Following  insufflation I noticed she had a ventral incisional hernia, probably  from her previous lap cholecystectomy, around the umbilicus.  It had  incarcerated omentum within it that was completely occluding this  defect.  I elected not to take that down at the present time.  I was  able to easily work around this.   The Nathanson retractor was placed and liver was retracted.  The EG  junction was identified and dissected.  She had no hiatal hernia and  there was no evidence of one.  I went ahead and found a spot on the  right crus, brought the band passer around without difficulty.  Because  of her size and the foregut fat we put in an APL band.  This was snapped  in place and plicated with three sutures of Surgidac held in place with  tie knots.   The tubing was then brought out through the right lower  incision and placed in the subcutaneous pocket with Marlex mesh on the  back.  The wounds  were irrigated, closed 4-0 Vicryl, with Benzoin Steri-Strips.  The  patient tolerated procedure well, was taken to recovery room in  satisfactory condition.   FINAL DIAGNOSIS:  Status post laparoscopic APL band placement.      Thornton Park Daphine Deutscher, MD  Electronically Signed     MBM/MEDQ  D:  04/28/2009  T:  04/28/2009  Job:  [id number]   cc:   Kirstie Peri, MD  Fax: [phone]

## 2011-02-06 ENCOUNTER — Emergency Department (HOSPITAL_COMMUNITY): Payer: BC Managed Care – PPO

## 2011-02-06 ENCOUNTER — Inpatient Hospital Stay (HOSPITAL_COMMUNITY)
Admission: AD | Admit: 2011-02-06 | Discharge: 2011-02-09 | DRG: 813 | Disposition: A | Discharge status: Home / Self Care | Payer: BC Managed Care – PPO | Source: Ambulatory Visit | Attending: Internal Medicine | Admitting: Internal Medicine

## 2011-02-06 DIAGNOSIS — R3129 Other microscopic hematuria: Secondary | ICD-10-CM | POA: Diagnosis present

## 2011-02-06 DIAGNOSIS — K5289 Other specified noninfective gastroenteritis and colitis: Principal | ICD-10-CM | POA: Diagnosis present

## 2011-02-06 DIAGNOSIS — D500 Iron deficiency anemia secondary to blood loss (chronic): Secondary | ICD-10-CM | POA: Diagnosis present

## 2011-02-06 DIAGNOSIS — E871 Hypo-osmolality and hyponatremia: Secondary | ICD-10-CM | POA: Diagnosis present

## 2011-02-06 DIAGNOSIS — I10 Essential (primary) hypertension: Secondary | ICD-10-CM | POA: Diagnosis present

## 2011-02-06 LAB — COMPREHENSIVE METABOLIC PANEL
BUN: 8 mg/dL (ref 6–23)
Calcium: 10.4 mg/dL (ref 8.4–10.5)
Creatinine, Ser: 0.69 mg/dL (ref 0.4–1.2)
Glucose, Bld: 262 mg/dL — ABNORMAL HIGH (ref 70–99)
Total Protein: 7.4 g/dL (ref 6.0–8.3)

## 2011-02-06 LAB — DIFFERENTIAL
Basophils Absolute: 0 10*3/uL (ref 0.0–0.1)
Eosinophils Relative: 1 % (ref 0–5)
Lymphocytes Relative: 17 % (ref 12–46)
Monocytes Absolute: 0.8 10*3/uL (ref 0.1–1.0)

## 2011-02-06 LAB — CBC
HCT: 41.3 % (ref 36.0–46.0)
MCH: 28.5 pg (ref 26.0–34.0)
MCHC: 34.4 g/dL (ref 30.0–36.0)
MCV: 82.9 fL (ref 78.0–100.0)
RDW: 12.7 % (ref 11.5–15.5)

## 2011-02-07 ENCOUNTER — Emergency Department (HOSPITAL_COMMUNITY): Payer: BC Managed Care – PPO

## 2011-02-07 LAB — GLUCOSE, CAPILLARY
Glucose-Capillary: 277 mg/dL — ABNORMAL HIGH (ref 70–99)
Glucose-Capillary: 112 mg/dL — ABNORMAL HIGH (ref 70–99)

## 2011-02-07 LAB — URINALYSIS, ROUTINE W REFLEX MICROSCOPIC
Glucose, UA: >1000 mg/dL — AB
Ketones, ur: >80 mg/dL — AB
Leukocytes, UA: NEGATIVE
Nitrite: POSITIVE — AB
Protein, ur: 100 mg/dL — AB
pH: 5 (ref 5.0–8.0)

## 2011-02-07 LAB — PREGNANCY, URINE: Preg Test, Ur: NEGATIVE

## 2011-02-07 LAB — BASIC METABOLIC PANEL
BUN: 9 mg/dL (ref 6–23)
CO2: 23 mEq/L (ref 19–32)
Chloride: 97 mEq/L (ref 96–112)
GFR calc non Af Amer: >60 mL/min (ref >60)
Glucose, Bld: 363 mg/dL — ABNORMAL HIGH (ref 70–99)
Potassium: 4.2 mEq/L (ref 3.5–5.1)
Sodium: 131 mEq/L — ABNORMAL LOW (ref 135–145)

## 2011-02-07 LAB — HEMOGLOBIN A1C
Hgb A1c MFr Bld: 10.1 % — ABNORMAL HIGH (ref <5.7)
Mean Plasma Glucose: 243 mg/dL — ABNORMAL HIGH (ref <117)

## 2011-02-07 LAB — DIFFERENTIAL
Basophils Absolute: 0 10*3/uL (ref 0.0–0.1)
Eosinophils Relative: 0 % (ref 0–5)
Lymphocytes Relative: 12 % (ref 12–46)
Lymphs Abs: 1.3 10*3/uL (ref 0.7–4.0)
Neutro Abs: 9.3 10*3/uL — ABNORMAL HIGH (ref 1.7–7.7)

## 2011-02-07 LAB — URINE MICROSCOPIC-ADD ON

## 2011-02-07 LAB — LIPID PANEL
HDL: 42 mg/dL (ref >39)
LDL Cholesterol: 84 mg/dL (ref 0–99)

## 2011-02-07 LAB — LIPASE, BLOOD: Lipase: 24 U/L (ref 11–59)

## 2011-02-07 LAB — CBC
HCT: 39.3 % (ref 36.0–46.0)
Hemoglobin: 12.7 g/dL (ref 12.0–15.0)
MCV: 84.9 fL (ref 78.0–100.0)
WBC: 11.1 10*3/uL — ABNORMAL HIGH (ref 4.0–10.5)

## 2011-02-07 MED ORDER — IOHEXOL 300 MG/ML  SOLN
100.0000 mL | Freq: Once | INTRAMUSCULAR | Status: AC | PRN
  Administered 2011-02-07: 100 mL via INTRAVENOUS

## 2011-02-08 LAB — GLUCOSE, CAPILLARY: Glucose-Capillary: 238 mg/dL — ABNORMAL HIGH (ref 70–99)

## 2011-02-09 LAB — DIFFERENTIAL
Basophils Absolute: 0 10*3/uL (ref 0.0–0.1)
Basophils Relative: 0 % (ref 0–1)
Eosinophils Relative: 4 % (ref 0–5)
Monocytes Absolute: 0.5 10*3/uL (ref 0.1–1.0)
Neutro Abs: 3 10*3/uL (ref 1.7–7.7)

## 2011-02-09 LAB — COMPREHENSIVE METABOLIC PANEL
AST: 10 U/L (ref 0–37)
Albumin: 2.4 g/dL — ABNORMAL LOW (ref 3.5–5.2)
Alkaline Phosphatase: 53 U/L (ref 39–117)
BUN: 5 mg/dL — ABNORMAL LOW (ref 6–23)
CO2: 28 mEq/L (ref 19–32)
Chloride: 109 mEq/L (ref 96–112)
GFR calc non Af Amer: >60 mL/min (ref >60)
Potassium: 3.7 mEq/L (ref 3.5–5.1)
Total Bilirubin: 0.1 mg/dL — ABNORMAL LOW (ref 0.3–1.2)

## 2011-02-09 LAB — CBC
MCHC: 32.1 g/dL (ref 30.0–36.0)
RDW: 12.9 % (ref 11.5–15.5)
WBC: 6 10*3/uL (ref 4.0–10.5)

## 2011-02-09 LAB — GLUCOSE, CAPILLARY
Glucose-Capillary: 149 mg/dL — ABNORMAL HIGH (ref 70–99)
Glucose-Capillary: 203 mg/dL — ABNORMAL HIGH (ref 70–99)

## 2011-02-09 NOTE — H&P (Signed)
NAME:  [name] [name], [name] [name]                ACCOUNT NO.:  [id number]  MEDICAL RECORD NO.:  [id number]           PATIENT TYPE:  I  LOCATION:  A305                          FACILITY:  [facility]  PHYSICIAN:  Vania Rea, M.D. DATE OF BIRTH:  1966/02/18  DATE OF ADMISSION:  02/07/2011 DATE OF DISCHARGE:  LH                             HISTORY & PHYSICAL   PRIMARY CARE PHYSICIAN:  Kirstie Peri, MD, in [place].  BARIATRIC SURGEON:  Thornton Park. Daphine Deutscher, MD  CHIEF COMPLAINT:  Abdominal pain for the past 12 hours.  HISTORY OF PRESENT ILLNESS:  This is a morbidly obese Caucasian lady status post lap band surgery in August 2010 who has been having intermittent episodes of abdominal pain and nausea which had onset a little more than onset about 2-3 years ago.  The patient has been repeatedly investigated both at [facility] as well as in [place] with Dr. Dulce Sellar to rule out Crohn disease and she reports that Crohn disease has been ruled out.  However, she continues to have these episodes.  The patient started having colicky abdominal pain about 4 p.m. yesterday, came to our emergency room, arrived about 10:30 and began to have episodes of vomiting.  Vomitus contains no blood.  She has been having no diarrhea nor black stool.  She reports that her abdomen did feel full, but it feels better now.  In the emergency room, the patient initially had x-ray which suggested small bowel obstruction.  She subsequently had a CT scan of the abdomen done, had no problem keeping down the contrast and the CT scan showed resolution of the small bowel obstruction.  However, CT scan also showed fecalization of the ileum and a region of the enteritis.  Hospitalist Service was called to assist with management.  The patient denies fever, cough, or cold.  She denies chest pain, dizziness, or syncope.  PAST MEDICAL HISTORY: 1. Hypertension. 2. Diabetes. 3. Hiatal hernia. 4. Kidney stones. 5. Migraine headaches. 6.  Obesity. 7. Diverticulosis. 8. Recurrent enteritis.  MEDICATIONS: 1. Levemir by FlexPen 50 units each morning. 2. Recently started a new diabetic tablet, does not know the name. 3. Lisinopril 40 mg daily which she has been taking many, many years. 4. She takes an over-the-counter contraceptive pills daily.  ALLERGIES:  No known drug allergies.  SOCIAL HISTORY:  Last normal menstrual period was June 2011.  She denies tobacco, alcohol, or illicit drug use.  She works as a Corporate treasurer at [employer].  FAMILY HISTORY:  Positive for coronary artery disease, diabetes, and hypertension.  REVIEW OF SYSTEMS:  Other than noted above unremarkable.  PHYSICAL EXAMINATION:  GENERAL:  Obese, but very pleasant and comfortable middle-aged Caucasian lady reclining in the stretcher VITAL SIGNS:  Temperature is 97.5, respirations 20, pulse 105, and blood pressure 158/84.  She is saturating at 96% on 2 liters. HEENT:  Pupils are round and equal.  Mucous membranes are pink. Moderately dehydrated.  She is anicteric.  No cervical lymphadenopathy, no thyromegaly, no carotid bruit. CHEST:  Clear to auscultation bilaterally. CARDIOVASCULAR:  Regular rhythm.  No murmur. ABDOMEN:  Obese and soft.  She has a moderate-sized pannus.  There is no intertriginous candida.  She has squirting bowel sounds in the right upper region of the abdomen and in the left diagonal half bowel sounds are more low pitched and intermittent. EXTREMITIES:  Without edema.  She has 2+ dorsalis pedis pulses bilaterally. SKIN:  Warm and dry.  No ulcerations. CENTRAL NERVOUS SYSTEM:  Cranial nerves II-XII are grossly intact.  She has no focal neurologic deficit.  LABORATORY DATA:  Her white count is elevated at 14.7, hemoglobin 14.2, platelets 204, and absolute neutrophil count 11.2.  Her sodium is 131, potassium 3.7, chloride 94, CO2 of 25, glucose elevated at 262, BUN is 8, and creatinine 0.69.  Her albumin is 3.3, but her  liver functions are otherwise unremarkable.  Her calcium is elevated at 10.0.  Her lipase is normal at 24.  Her pregnancy test is negative.  Urinalysis shows hazy urine, greater than 1000 glucose, greater than 80 ketones, large amount of blood, 100 protein, nitrite positive, leukocyte esterase negative. Urine microscopy shows 0-2 white cells with red cells too numerous to count.  CT scan of abdomen and pelvis compared to the x-ray done 3 hours prior to the CT, small bowel dilation resolved.  No evidence of obstruction.  Fecalization of the segment of mid ileum with a long segment of wall thickening due to fecalization and small to moderate amount of free fluid tracking into the pelvis.  Findings compatible with infectious or inflammatory process.  Trace fluid around the liver and spleen.  A 6-mm stone near the lower pole of left kidney.  ASSESSMENT: 1. Acute flare of regional enteritis. 2. Hypertension, uncontrolled. 3. Diabetes type 2, uncontrolled. 4. Hematuria, likely secondary to kidney stones. 5. History of migraine headache. 6. Morbid obesity. 7. Hyponatremia due to dehydration.  PLAN:  We will admit this lady for hydration.  We will keep her n.p.o. She has already had some NG tube suction.  We will attempt to start clear liquids later this morning.  We will treat empirically with Cipro and Flagyl.  Other plans as per orders.     Vania Rea, M.D.     LC/MEDQ  D:  02/07/2011  T:  02/07/2011  Job:  [id number]  cc:   Kirstie Peri, MD Fax: [phone]  Thornton Park. Daphine Deutscher, MD [address] [place] [address]  Electronically Signed by Vania Rea M.D. on 02/09/2011 10:37:42 PM

## 2011-02-13 NOTE — Discharge Summary (Signed)
NAME:  [name], [name]                ACCOUNT NO.:  [id number]  MEDICAL RECORD NO.:  [id number]           PATIENT TYPE:  I  LOCATION:  A305                          FACILITY:  [facility]  PHYSICIAN:  Elliot Cousin, M.D.    DATE OF BIRTH:  1966-07-31  DATE OF ADMISSION:  02/06/2011 DATE OF DISCHARGE:  05/23/2012LH                              DISCHARGE SUMMARY   DISCHARGE DIAGNOSES: 1. Recurrent abdominal pain, nausea, and vomiting likely secondary to     recurrent distal ileitis. 2. Type 2 diabetes mellitus.  The patient's hemoglobin A1c was 10.1. 3. Hypertension, controlled. 4. Microhematuria secondary to menstrual bleeding. 5. Anemia, secondary to menstrual bleeding.  The patient's hemoglobin     was 10.6 prior to discharge. 6. Hyponatremia, secondary to volume depletion, resolved.  DISCHARGE MEDICATIONS: 1. Cipro 500 mg b.i.d. for 10 more days. 2. Glipizide 5 mg b.i.d. (new medication). 3. Flagyl 500 mg every 8 hours for 10 more days.4. Levemir FlexPen.  The dose was increased from 50-60 subcu each     morning. 5. Lisinopril 40 mg daily. 6. Sprintec birth control tablet once daily. 7. Tylenol 500 mg 1 capsule every 6 hours as needed for pain. 8. Vitamin D 2000 international units once daily.  DISCHARGE DISPOSITION:  The patient was discharged to home in improved and stable condition on Feb 09, 2011.  She will follow up with Dr. Sherryll Burger as scheduled in June.  An appointment will be made for her to follow up with gastroenterologist Dr. Jonette Eva in 2 to 4 weeks.  CONSULTATIONS:  None.  PROCEDURES PERFORMED:  CT scan of the abdomen and pelvis with contrast on Feb 07, 2011.  The results revealed small bowel dilatation has resolved.  No evidence of bowel obstruction.  Fecalization of a segment of mid ileum with a long segment wall thickening distal to the fecalization and a small to moderate amount of free fluid tracking into the pelvis.  Findings compatible with an infectious or  inflammatory process.  Trace of free fluid surrounding the liver and spleen.  A 6-mm stone near the lower pole of the left kidney.  HISTORY OF PRESENT ILLNESS:  The patient is a 45 year old woman with a past medical history significant for recurrent ileitis, type 2 diabetes mellitus, nephrolithiasis, and hypertension.  She presented to the emergency department on Feb 06, 2011, with a chief complaint of abdominal pain, nausea, and vomiting.  In the emergency department, she was noted to be hemodynamically stable although hypertensive and afebrile.  Her acute abdominal series revealed distention of small bowel loops 4-6 cm in maximal diameter with a relative paucity of bowel gas in the colon, gastric band remains in the expected alignment, and no acute cardiopulmonary process seen.  Her WBC was elevated at 14.7.  Her serum sodium was low at 131.  Her venous glucose was 262.  Her blood lipase was 24.  Her urine HCG was negative.  Her urinalysis revealed greater than 1000 glucose, small bilirubin, greater than 80 ketones, large blood, positive nitrites, negative leukocytes.  Her micro urine revealed 0 to 2 wbc's and too numerous to count  rbc's.  She was admitted for further evaluation and management.  HOSPITAL COURSE:  The patient was started empirically on Cipro and Flagyl intravenously.  Because of the question of small bowel obstruction an NG tube was placed in the emergency department. Subsequently, a CT scan of her abdomen and pelvis was ordered.  The results were dictated above.  Most notably, there was no evidence of bowel obstruction on the CT.  There was fecalization of the segment of mid ileum with a long segment of wall thickening distal to the fecalization.  The patient has a history of recurrent ileitis.  Dr. Darrick Penna performed an ileocolonoscopy in October 2011 and it revealed distal terminal ileitis.  Biopsies were taken at that time.  The pathology report revealed benign  small bowel mucosa and benign colonic mucosa with no active inflammation, microscopic colitis, or granulomas.  The patient's abdominal pain, nausea, and vomiting resolved. The NG tube was discontinued.  Her diet was advanced from clear liquids to full liquids and then to a carbohydrate modified diet.  She tolerated the advancement well.  Cipro and Flagyl were changed to p.o.  She was discharged to home on 10 more days of both.  The patient will follow up with Dr. Darrick Penna for further evaluation and management in the outpatient setting.  The patient's other medical conditions remained stable more or less. Her diabetes has been historically uncontrolled.  Her hemoglobin A1c was consistent with poor outpatient control.  It was 10.1.  She was treated with sliding scale NovoLog and Lantus.  Upon discharge, she was advised to increase the Levemir insulin to 60 units daily.  She was also started on glipizide 5 mg b.i.d. upon discharge.  She had tried metformin in the past but she could not tolerate it.  Her primary care physician had recently prescribed Tradjenta, however because of cost, she could not afford it.  I informed her that she may require short-acting insulin, but it will be reasonable to try glipizide first.  She was in agreement. Her fasting lipid profile was assessed.  Her total cholesterol was 155, triglycerides 145, HDL 42, and LDL 84.  The patient was noted to have a large amount of blood on the micro urinalysis.  She was also noted to be anemic.  She reported that she was on her menstrual period.  This would account for both.  Also, the patient was hyponatremic on admission secondary to volume depletion.  Following IV fluids, her serum sodium improved to 141.     Elliot Cousin, M.D.     DF/MEDQ  D:  02/09/2011  T:  02/10/2011  Job:  [id number]  cc:   Kirstie Peri, MD Fax: [phone]  Jonette Eva, M.D. [address] [place] , [place] [address]  Electronically Signed by  Elliot Cousin M.D. on 02/13/2011 05:10:04 PM

## 2011-02-23 ENCOUNTER — Encounter: Payer: Self-pay | Admitting: Gastroenterology

## 2011-02-23 ENCOUNTER — Ambulatory Visit (INDEPENDENT_AMBULATORY_CARE_PROVIDER_SITE_OTHER): Payer: BC Managed Care – PPO | Admitting: Gastroenterology

## 2011-02-23 VITALS — BP 166/95 | HR 77 | Temp 97.6°F | Ht 66.0 in | Wt 263.8 lb

## 2011-02-23 DIAGNOSIS — K5000 Crohn's disease of small intestine without complications: Secondary | ICD-10-CM

## 2011-02-23 NOTE — Progress Notes (Signed)
  Subjective:    Patient ID: [name] [name], female    DOB: 05/20/1966, 45 y.o.   MRN: [id number]  PCP: [facility]  1o [facility]  HPI Pt having recurrent episode of distal ileitis. 2 episode in 2010, 1 episode in 2011, 1 episode in 2012. Doing fine and then band like pain in lower abd pain. SO sever she will start vomtiing. Usu. Has diarrhea, no blood, or blk tarry stools. Had ex lap in 2010 which showed edematous erythematous bowel. TCS 2010: nl, Ileo TCS/Bx TI/Colon-nl. Last CT MAY 2012-THICKENED TI. Lost 75 lbs since 2010 2o to lap band surgery.  Past Medical History  Diagnosis Date  . Ileitis, terminal    History reviewed. No pertinent past surgical history.  No Known Allergies  Current Outpatient Prescriptions  Medication Sig Dispense Refill  . Cholecalciferol (VITAMIN D) 2000 UNITS tablet Take 2,000 Units by mouth daily.        . ciprofloxacin (CIPRO) 500 MG tablet       . insulin detemir (LEVEMIR) 100 UNIT/ML injection Inject 60 Units into the skin daily after breakfast.        . lisinopril (PRINIVIL,ZESTRIL) 40 MG tablet       . metroNIDAZOLE (FLAGYL) 500 MG tablet       . MONONESSA 0.25-35 MG-MCG per tablet         No family history on file.   Review of Systems  All other systems reviewed and are negative.       Objective:   Physical Exam  Vitals reviewed. Constitutional: She is oriented to person, place, and time. She appears well-developed and well-nourished. No distress.  HENT:  Head: Normocephalic and atraumatic.  Mouth/Throat: Oropharynx is clear and moist. No oropharyngeal exudate.  Eyes: Pupils are equal, round, and reactive to light.  Neck: Normal range of motion.  Cardiovascular: Normal rate.   Pulmonary/Chest: Effort normal.  Abdominal: Soft. Bowel sounds are normal. She exhibits no distension. There is no tenderness.  Musculoskeletal: She exhibits no edema.  Lymphadenopathy:    She has no cervical adenopathy.  Neurological: She is alert and oriented to  person, place, and time.  Psychiatric: She has a normal mood and affect.          Assessment & Plan:

## 2011-02-23 NOTE — Assessment & Plan Note (Addendum)
Persistent finding on CT since APR 2010-? Crohn's disease. I personally reviewed CT with Dr. Tyron Russell.  Agile capsule study and if capsule can pass, then CE. If lesion in the TI, needs IBD panel & will start Entocort x8 weeks & mesalamine. Consider DBE. OPV in 3 mos.

## 2011-02-24 ENCOUNTER — Encounter: Payer: Self-pay | Admitting: Gastroenterology

## 2011-02-24 NOTE — Progress Notes (Signed)
Cc to PCP 

## 2011-02-24 NOTE — Progress Notes (Signed)
Spoke w/ Crystal.  Pt scheduled for AGILE Tues 6/12 earliest available due to her work schedule.

## 2011-02-24 NOTE — Progress Notes (Signed)
Pt is aware of OV on 9/5 @ 330 with SF. SF is the primary GI doctor not RMR per [facility]

## 2011-03-01 ENCOUNTER — Ambulatory Visit (HOSPITAL_COMMUNITY): Payer: BC Managed Care – PPO

## 2011-03-01 ENCOUNTER — Ambulatory Visit (HOSPITAL_COMMUNITY)
Admission: RE | Admit: 2011-03-01 | Discharge: 2011-03-01 | Disposition: A | Discharge status: Home / Self Care | Payer: BC Managed Care – PPO | Source: Ambulatory Visit | Attending: Gastroenterology | Admitting: Gastroenterology

## 2011-03-01 DIAGNOSIS — Z8719 Personal history of other diseases of the digestive system: Secondary | ICD-10-CM | POA: Insufficient documentation

## 2011-03-01 DIAGNOSIS — Z9884 Bariatric surgery status: Secondary | ICD-10-CM | POA: Insufficient documentation

## 2011-03-01 DIAGNOSIS — E119 Type 2 diabetes mellitus without complications: Secondary | ICD-10-CM | POA: Insufficient documentation

## 2011-03-01 DIAGNOSIS — R109 Unspecified abdominal pain: Secondary | ICD-10-CM | POA: Insufficient documentation

## 2011-03-01 DIAGNOSIS — I10 Essential (primary) hypertension: Secondary | ICD-10-CM | POA: Insufficient documentation

## 2011-03-02 ENCOUNTER — Encounter: Payer: BC Managed Care – PPO | Admitting: Gastroenterology

## 2011-03-02 ENCOUNTER — Ambulatory Visit (HOSPITAL_COMMUNITY): Payer: BC Managed Care – PPO

## 2011-03-04 ENCOUNTER — Telehealth: Payer: Self-pay | Admitting: Gastroenterology

## 2011-03-04 NOTE — Telephone Encounter (Signed)
Called pt's cell to discuss Agile study. LVM-call [phone] to discuss.

## 2011-03-08 ENCOUNTER — Telehealth: Payer: Self-pay

## 2011-03-08 NOTE — Telephone Encounter (Signed)
Pt called and said she missed Dr. Evelina Dun call on Friday. She would like a call today. Work [phone] or cell [phone].

## 2011-03-08 NOTE — Telephone Encounter (Signed)
PT is scheduled for GIVENS on 03/15/11- ENDO aware if pt unable to swallow we will proceed with EGD w/ Capsule deployment. LMOVM

## 2011-03-08 NOTE — Telephone Encounter (Signed)
I personally reviewed Agile study with Dr. Tyron Russell. Capsule passes to w/i 13 cm of the IC valave. Called pt at work-LVM-call me in endo [phone] or [phone]. Called pt's cell-not having any severe pain since last visit. Still has mild , intermittent pain.  Please call pt at work-[phone] to schedule CE,  Tuesday, June 26, Dx: terminal ileitis, evaluate for Crohn's. She had difficulty swallowing the Agile pill. Pt will try to swallow capsule on June 26. If she cannot she will need an EGD for capsule placement. PRECERT FOR EGD W/ CAPSULE PLACEMENT, dx: dysphagia.

## 2011-03-15 ENCOUNTER — Ambulatory Visit (HOSPITAL_COMMUNITY)
Admission: RE | Admit: 2011-03-15 | Discharge: 2011-03-15 | Disposition: A | Discharge status: Home / Self Care | Payer: BC Managed Care – PPO | Source: Ambulatory Visit | Attending: Gastroenterology | Admitting: Gastroenterology

## 2011-03-15 ENCOUNTER — Encounter: Payer: BC Managed Care – PPO | Admitting: Gastroenterology

## 2011-03-15 DIAGNOSIS — E119 Type 2 diabetes mellitus without complications: Secondary | ICD-10-CM | POA: Insufficient documentation

## 2011-03-15 DIAGNOSIS — Z79899 Other long term (current) drug therapy: Secondary | ICD-10-CM | POA: Insufficient documentation

## 2011-03-15 DIAGNOSIS — K5289 Other specified noninfective gastroenteritis and colitis: Secondary | ICD-10-CM | POA: Insufficient documentation

## 2011-03-15 DIAGNOSIS — Z794 Long term (current) use of insulin: Secondary | ICD-10-CM | POA: Insufficient documentation

## 2011-03-15 NOTE — Telephone Encounter (Signed)
Spoke with pt. Capsule 6/26.

## 2011-03-16 ENCOUNTER — Telehealth: Payer: Self-pay | Admitting: Gastroenterology

## 2011-03-16 DIAGNOSIS — R1110 Vomiting, unspecified: Secondary | ICD-10-CM

## 2011-03-16 NOTE — Telephone Encounter (Signed)
Called [name] [name] to discuss capsule. Capsule remained in her gastric pouch for the entire exam. [name] [name] stated she vomited 8 times yesterday but did not see the capsule. Last BM was today-nl. She did not see the capsule. [name] [name] needs plain film of the abd to assess location of the capsule. [name] [name] will need capsule placed via endoscopy. [name] [name] had successful EGD in 2010. [name] [name] asked to follow a soft diet today for lunch. She will go to radiology ~0800 on 6/28 due to work schedule. [name] [name] asked to call me if vomiting develops after eating and we will perform EGD today and capsule placement.

## 2011-03-17 ENCOUNTER — Other Ambulatory Visit: Payer: Self-pay | Admitting: Gastroenterology

## 2011-03-17 ENCOUNTER — Encounter: Payer: Self-pay | Admitting: General Practice

## 2011-03-17 ENCOUNTER — Ambulatory Visit (HOSPITAL_COMMUNITY)
Admission: RE | Admit: 2011-03-17 | Discharge: 2011-03-17 | Disposition: A | Discharge status: Home / Self Care | Payer: BC Managed Care – PPO | Source: Ambulatory Visit | Attending: Gastroenterology | Admitting: Gastroenterology

## 2011-03-17 DIAGNOSIS — R1110 Vomiting, unspecified: Secondary | ICD-10-CM | POA: Insufficient documentation

## 2011-03-17 DIAGNOSIS — N200 Calculus of kidney: Secondary | ICD-10-CM | POA: Insufficient documentation

## 2011-03-17 DIAGNOSIS — Z9884 Bariatric surgery status: Secondary | ICD-10-CM | POA: Insufficient documentation

## 2011-03-24 ENCOUNTER — Ambulatory Visit (HOSPITAL_COMMUNITY)
Admission: RE | Admit: 2011-03-24 | Discharge: 2011-03-24 | Disposition: A | Discharge status: Home / Self Care | Payer: BC Managed Care – PPO | Source: Ambulatory Visit | Attending: Gastroenterology | Admitting: Gastroenterology

## 2011-03-24 ENCOUNTER — Ambulatory Visit (HOSPITAL_COMMUNITY): Payer: BC Managed Care – PPO | Admitting: Gastroenterology

## 2011-03-24 ENCOUNTER — Encounter: Payer: BC Managed Care – PPO | Admitting: Gastroenterology

## 2011-03-24 ENCOUNTER — Other Ambulatory Visit: Payer: Self-pay | Admitting: Gastroenterology

## 2011-03-24 DIAGNOSIS — I10 Essential (primary) hypertension: Secondary | ICD-10-CM | POA: Insufficient documentation

## 2011-03-24 DIAGNOSIS — K2970 Gastritis, unspecified, without bleeding: Secondary | ICD-10-CM

## 2011-03-24 DIAGNOSIS — K209 Esophagitis, unspecified: Secondary | ICD-10-CM

## 2011-03-24 DIAGNOSIS — K2940 Chronic atrophic gastritis without bleeding: Secondary | ICD-10-CM | POA: Insufficient documentation

## 2011-03-24 DIAGNOSIS — K2980 Duodenitis without bleeding: Secondary | ICD-10-CM | POA: Insufficient documentation

## 2011-03-24 DIAGNOSIS — Z79899 Other long term (current) drug therapy: Secondary | ICD-10-CM | POA: Insufficient documentation

## 2011-03-24 DIAGNOSIS — E119 Type 2 diabetes mellitus without complications: Secondary | ICD-10-CM | POA: Insufficient documentation

## 2011-03-24 DIAGNOSIS — K208 Other esophagitis without bleeding: Secondary | ICD-10-CM | POA: Insufficient documentation

## 2011-03-24 DIAGNOSIS — K2990 Gastroduodenitis, unspecified, without bleeding: Secondary | ICD-10-CM

## 2011-03-24 DIAGNOSIS — K633 Ulcer of intestine: Secondary | ICD-10-CM

## 2011-03-25 ENCOUNTER — Telehealth: Payer: Self-pay | Admitting: Gastroenterology

## 2011-03-25 DIAGNOSIS — K5000 Crohn's disease of small intestine without complications: Secondary | ICD-10-CM

## 2011-03-25 NOTE — Telephone Encounter (Signed)
Called pt at work to discuss CE. Placed on hold & no response. LVM on cell-call [phone].

## 2011-03-28 MED ORDER — MESALAMINE ER 500 MG PO CPCR: 1000.0000 mg | ORAL_CAPSULE | Freq: Three times a day (TID) | ORAL | Status: DC

## 2011-03-28 NOTE — Telephone Encounter (Signed)
Pt called. She is coming by tomorrow for the lab order. At front for pt.

## 2011-03-28 NOTE — Telephone Encounter (Addendum)
Spoke with pt. Explained Givens results and discussed management options. Pt would like to take Pentasa and if she has a flare agreed to go to [facility] for DBE. She needs [facility] IBD panel. FAX ORDER TO LAB.OPV IN 3 MOS W/ SLF, Dx: ileitis.

## 2011-03-28 NOTE — Progress Notes (Signed)
Cc path report to Dr. Sherryll Burger

## 2011-04-13 NOTE — Op Note (Signed)
NAME:  [name], [name]                ACCOUNT NO.:  [id number]  MEDICAL RECORD NO.:  [id number]  LOCATION:  DAYP                          FACILITY:  [facility]  PHYSICIAN:  Jonette Eva, M.D.     DATE OF BIRTH:  1966/08/23  DATE OF PROCEDURE:  03/24/2011 DATE OF DISCHARGE:  03/24/2011                              OPERATIVE REPORT   REFERRING PHYSICIAN:  Kirstie Peri, MD  PROCEDURE:  Givens capsule endoscopy.  INDICATION FOR EXAM:  Ms. [name] [name] is a 45 year old female who presented with acute onset of abdominal pain in August 2010 prior to her gastric band placement.  She had a CT scan done of the abdomen and pelvis with IV contrast for nausea, vomiting and diarrhea.  It revealed distal bowel wall thickening involving the terminal ileum and extending into the ileocecal junction.  The differential diagnosis include inflammatory bowel disease, infection or ischemia.  Nine days after the CT scan and her gastric bypass surgery, she presented with lower abdominal pain and diarrhea.  She had a known history of an incarcerated umbilical hernia which was not intervened on during her gastric bypass.  She had an exploratory laparotomy and her umbilical hernia was repaired.  The terminal ileum was inflamed "it did not appear to be Crohn's disease." The bowel appeared to be inflamed with a serositis, but she had no focal area suggestion of bowel obstruction so she WAS DIAGNOSED WITH infectious gastroileitis.    [facility] 10[facility]10 abdominal pain similar to her episode in August 2010.  She had previously undergone a colonoscopy and EGD by Dr. Dulce Sellar the year before.  She had a repeat ileocolonoscopy in October 2011 with biopsies of the terminal ileum.  Biopsy showed benign small bowel and no acute inflammation or granulomas.  Biopsies of her colon showed benign colonic mucosa.    She did well until May 2012 when she presented again with abdominal pain.  CT scan  suggested small bowel obstruction.  Again a CT scan of the abdomen and pelvis was obtained with IV contrast and she had no evidence of bowel obstruction, but had a long segment of wall thickening in the distal TERMINAL ILEUM.  The findings were compatible with an infectious or inflammatory process.  She also was noted to have a 6 mm kidney stone on the left.  She was discharged to home after 3 days in the hospital on Cipro and Flagyl.  She has not had any recurrent episodes.  She had Givens capsule placement today to evaluate her terminal ileum.  PATIENT DATA:  Weight 259 pounds, height 66 inches, waist 45 inches, build stocky.  Gastric passage time 0.  Small bowel passage time 2 hours and 29 minutes.  FINDINGS:  At 1 hour and 34 minutes a small ulcer was noted.  The ulcerations continued until 1 hour and 46 minutes.  The findings occurred approximately in the last third of the small intestines.  The first ulcer was noted at 68% of the small bowel viewing time.  No old blood or fresh blood was seen in the small intestines.  Limited view of the gastric mucosa and colonic mucosa.  No old blood or fresh blood was seen in the colon.  DIAGNOSES:  Terminal ileal ulcers, etiology unclear.  The patient does not use antiinflammatory drugs.  RECOMMENDATIONS: 1. We will call [name] to discuss her options.  Her options include     a second opinion at [facility] and/or double-     balloon enteroscopy for a tissue diagnosis or an other option would     be empiric treatment with Pentasa in an attempt to treat presumed     Crohn disease and a PROMETHEUS IBD panel. 2. We will wait phone call for [name].  ADDENDUM 72512-PT ELECTED TO USE PENTASA AND HER IBD PANEL: ASCA 4.3( nl < 8.5), pANCA 7.4 (NL < 19.8)   Jonette Eva, M.D.     SF/MEDQ  D:  03/25/2011  T:  03/26/2011  Job:  [id number]  cc:   Kirstie Peri, MD Fax: [phone]  Electronically Signed by Jonette Eva M.D. on  04/13/2011 01:25:49 PM

## 2011-04-13 NOTE — Telephone Encounter (Signed)
Pt is aware of OV in Sept

## 2011-04-13 NOTE — Op Note (Signed)
  NAME:  [name] [name]                ACCOUNT NO.:  [id number]  MEDICAL RECORD NO.:  [id number]  LOCATION:  DAYP                          FACILITY:  [facility]  PHYSICIAN:  Jonette Eva, M.D.     DATE OF BIRTH:  June 22, 1966  DATE OF PROCEDURE:  03/24/2011 DATE OF DISCHARGE:                              OPERATIVE REPORT   REFERRING PROVIDER:  Kirstie Peri, MD  PROCEDURE:  Esophagogastroduodenoscopy with Givens capsule placement and cold forceps biopsy gastric mucosa.  INDICATION FOR EXAM:  [name] [name] is a 45 year old female who has persistent ileitis.  She had AGILE studY which showed the capsule will pass through her small bowel and through her gastric pouch. However, when she swallowed the Givens capsule, it remained in her gastric pouch.  The upper endoscopy is being performed to place the Givens capsule into the small bowel.  FINDINGS: 1. Small gastric pouch approximately 2-3 cm.  The diagnostic     gastroscope passed through easily.  Patchy erythema in the antrum     without erosion or ulceration.  Biopsies obtained via cold forceps     to evaluate for H. pylori gastritis.  The Givens capsule was     released into the duodenal bulb. 2. Normal esophagus without evidence of small linear erosions at the     GE junction.  Otherwise no evidence of Barrett's mass or     ulceration. 3. Mild patchy erythema in the duodenum.  Normal ampulla and second     portion of the duodenum with moderate bile staining.  DIAGNOSES: 1. Mild esophagitis. 2. Mild gastritis. 3. Mild duodenitis.  RECOMMENDATIONS: 1. We will await biopsies. 2. We will call with results of her Givens capsule placement. 3. She was given a handout on gastritis. 4. She should continue to follow up post gastric bypass diet. 5. We will schedule followup after the Givens capsule was read.  MEDICATIONS: 1. Demerol 100 mg IV. 2. Versed 5 mg IV.  PROCEDURE TECHNIQUE:  Physical exam was performed.  Informed consent  was obtained from the patient after explaining the benefits, risks and alternatives to the procedure.  The patient was connected to the monitor and placed in left lateral position.  Continuous oxygen was provided by nasal cannula and IV medicine administered through an indwelling cannula.  After administration of sedation, the patient's esophagus was intubated and the scope was advanced under direct visualization to the second portion of the duodenum.  The scope was removed slowly by carefully examining the color, texture, anatomy and integrity of the mucosa on the way out.  The Givens capsule was attached, and the capsule was advanced under limited visualization to the duodenal bulb.  The capsule was released.  The scope was removed.  The patient was recovered in endoscopy and discharged home in satisfactory condition.  PATH: MILD GASTRITIS   Jonette Eva, M.D.     SF/MEDQ  D:  03/24/2011  T:  03/25/2011  Job:  [id number]  Electronically Signed by Jonette Eva M.D. on 04/13/2011 01:18:11 PM

## 2011-04-27 ENCOUNTER — Telehealth: Payer: Self-pay | Admitting: Gastroenterology

## 2011-04-27 NOTE — Telephone Encounter (Addendum)
Pt clincally stable. Discussed NEG IBD panel. eXPLAINED SOME PEOPLE HAVE NL ibd PANEL BUT STILL HAVE CROHN'S.  Pt taking Pentasa for approximately one month. Repeat CT Scan the first week in SEP and if wall thickening persist, pt needs DBE at [facility]. OPV in [facility] 2012.

## 2011-05-06 ENCOUNTER — Encounter: Payer: Self-pay | Admitting: Gastroenterology

## 2011-05-25 ENCOUNTER — Ambulatory Visit: Payer: BC Managed Care – PPO | Admitting: Gastroenterology

## 2011-05-25 NOTE — Telephone Encounter (Signed)
Pt has OV on 05/31/11 @ 3pm with SF

## 2011-05-31 ENCOUNTER — Ambulatory Visit (INDEPENDENT_AMBULATORY_CARE_PROVIDER_SITE_OTHER): Payer: BC Managed Care – PPO | Admitting: Gastroenterology

## 2011-05-31 ENCOUNTER — Encounter: Payer: Self-pay | Admitting: Gastroenterology

## 2011-05-31 VITALS — BP 140/100 | HR 75 | Temp 97.4°F | Ht 66.0 in | Wt 264.6 lb

## 2011-05-31 DIAGNOSIS — K5000 Crohn's disease of small intestine without complications: Secondary | ICD-10-CM

## 2011-05-31 NOTE — Progress Notes (Signed)
  Subjective:    Patient ID: [name] [name], female    DOB: 08/21/1966, 45 y.o.   MRN: [id number]  PCP: [facility]  HPI NO EPISODES OF ABD PAIN. APPETITE: OK. BMS: GOOD. Taking 2 TID. No pain and no diarrhea. At first a little nausea, now in system no problems. LOTS OF JOB STRESS.  Past Medical History  Diagnosis Date  . 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 DM (diabetes mellitus)   . HTN (hypertension)   . Hyperlipemia   . Vitamin D deficiency   . Diverticulosis OCT 2011     PAN-COLONIC  . Obesity, Class III, BMI 40-49.9 (morbid obesity) AUG 2010 305 LBS    MAY 2012 267   Past Surgical History  Procedure Date  . Upper gastrointestinal endoscopy MAY 2012 GIVENS CAPSULE PLACEMENT    GASTRITIS  . Laparoscopic gastric banding AUG 2010 BMI 50    GASTRIC POUCH 2-3 CM  . Umbilical hernia repair AUG 2010 MATTHEW MARTIN    INCARCERATED OMENTUM  . Cholecystectomy   . Abdominal exploration surgery May 05, 2009 MM    INFLAMMED TI (SEROSITIS), "DIDN'T LOOK LIKE CROHN'S"  . Colonoscopy DEC 2010 WO  . Colonoscopy OCT 2011    MILD ERYTHEMA IN TI (30-40 CM), NL SB/COLON Bx    No Known Allergies  Current Outpatient Prescriptions  Medication Sig Dispense Refill  . Cholecalciferol (VITAMIN D) 2000 UNITS tablet Take 2,000 Units by mouth daily.        . insulin detemir (LEVEMIR) 100 UNIT/ML injection Inject 60 Units into the skin daily after breakfast.        . lisinopril (PRINIVIL,ZESTRIL) 40 MG tablet       . mesalamine (PENTASA) 500 MG CR capsule Take 2 capsules (1,000 mg total) by mouth 3 (three) times daily.    Marland Kitchen MONONESSA 0.25-35 MG-MCG per tablet             Review of Systems     Objective:   Physical Exam  Constitutional: She is oriented to person, place, and time. She appears well-nourished. No distress.  HENT:  Head: Normocephalic and atraumatic.  Mouth/Throat: Oropharynx is clear and moist. No oropharyngeal exudate.  Eyes: Pupils are equal,  round, and reactive to light. No scleral icterus.  Neck: Normal range of motion. Neck supple.  Cardiovascular: Normal rate and regular rhythm.   Pulmonary/Chest: Effort normal and breath sounds normal.  Abdominal: Soft. Bowel sounds are normal. She exhibits no distension. There is no tenderness.       OBESE  Neurological: She is alert and oriented to person, place, and time.       NO FOCAL DEFICITS          Assessment & Plan:

## 2011-06-09 ENCOUNTER — Encounter: Payer: Self-pay | Admitting: Gastroenterology

## 2011-06-09 NOTE — Assessment & Plan Note (Signed)
Pt currently Asx.  Continue Pentasa. Will need DBE if Sx return. OPV in 6 mos. Consider repeat imaging.

## 2011-06-09 NOTE — Progress Notes (Signed)
Cc to PCP 

## 2011-06-09 NOTE — Progress Notes (Signed)
Reminder in epic to follow up in 6 months °

## 2011-09-15 ENCOUNTER — Other Ambulatory Visit: Payer: Self-pay

## 2011-09-15 DIAGNOSIS — K5000 Crohn's disease of small intestine without complications: Secondary | ICD-10-CM

## 2011-09-15 MED ORDER — MESALAMINE ER 500 MG PO CPCR: 1000.0000 mg | ORAL_CAPSULE | Freq: Three times a day (TID) | ORAL | Status: DC

## 2011-09-15 NOTE — Telephone Encounter (Signed)
Pentasa 500 mg capsules Take two by mouth 3 times daily.

## 2011-09-15 NOTE — Telephone Encounter (Signed)
Please clarify dose. Dose entered is not typical.

## 2011-12-08 ENCOUNTER — Encounter: Payer: Self-pay | Admitting: Gastroenterology

## 2012-11-15 ENCOUNTER — Telehealth (INDEPENDENT_AMBULATORY_CARE_PROVIDER_SITE_OTHER): Payer: Self-pay | Admitting: Surgery

## 2012-11-15 NOTE — Telephone Encounter (Signed)
11/15/12 lm and mailed recall letter for pt to call and schedule a bariatric follow-up appt. Pt had lap band surgery 05/05/09. (lss)

## 2012-12-06 IMAGING — CR DG ABDOMEN ACUTE W/ 1V CHEST
4 series · 4 of 4 positions shown · non-contrast
Comparison: CT of the abdomen and pelvis performed 06/23/2010

CLINICAL DATA: Lower periumbilical abdominal pain, nausea and
vomiting.  Diarrhea.  History of diabetes and hypertension.

ACUTE ABDOMEN SERIES (ABDOMEN 2 VIEW & CHEST 1 VIEW)

[view not recorded (1 of 4)]
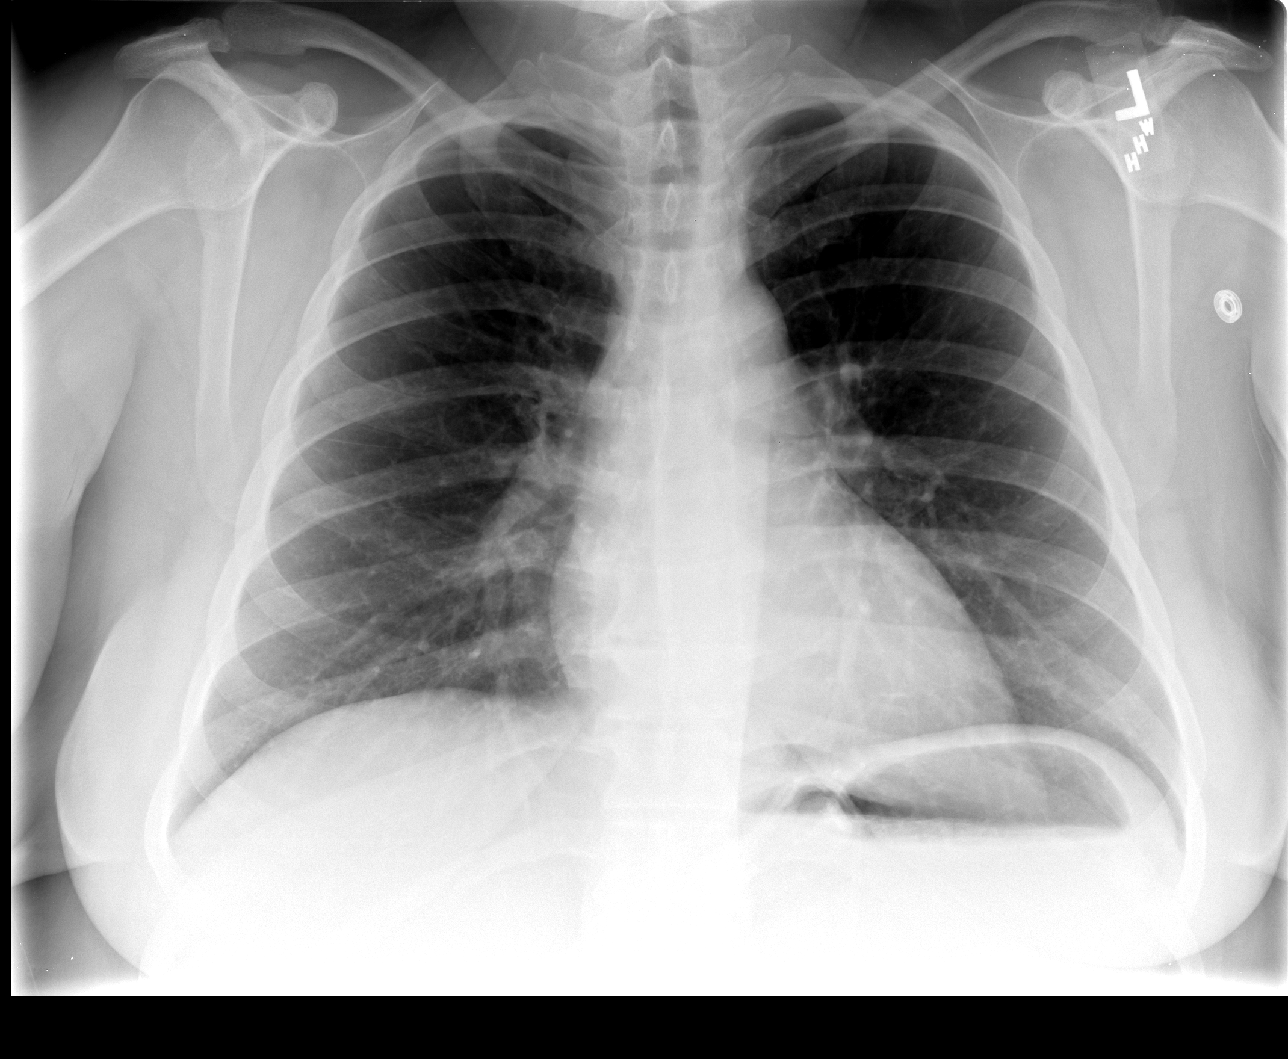

[view not recorded (2 of 4)]
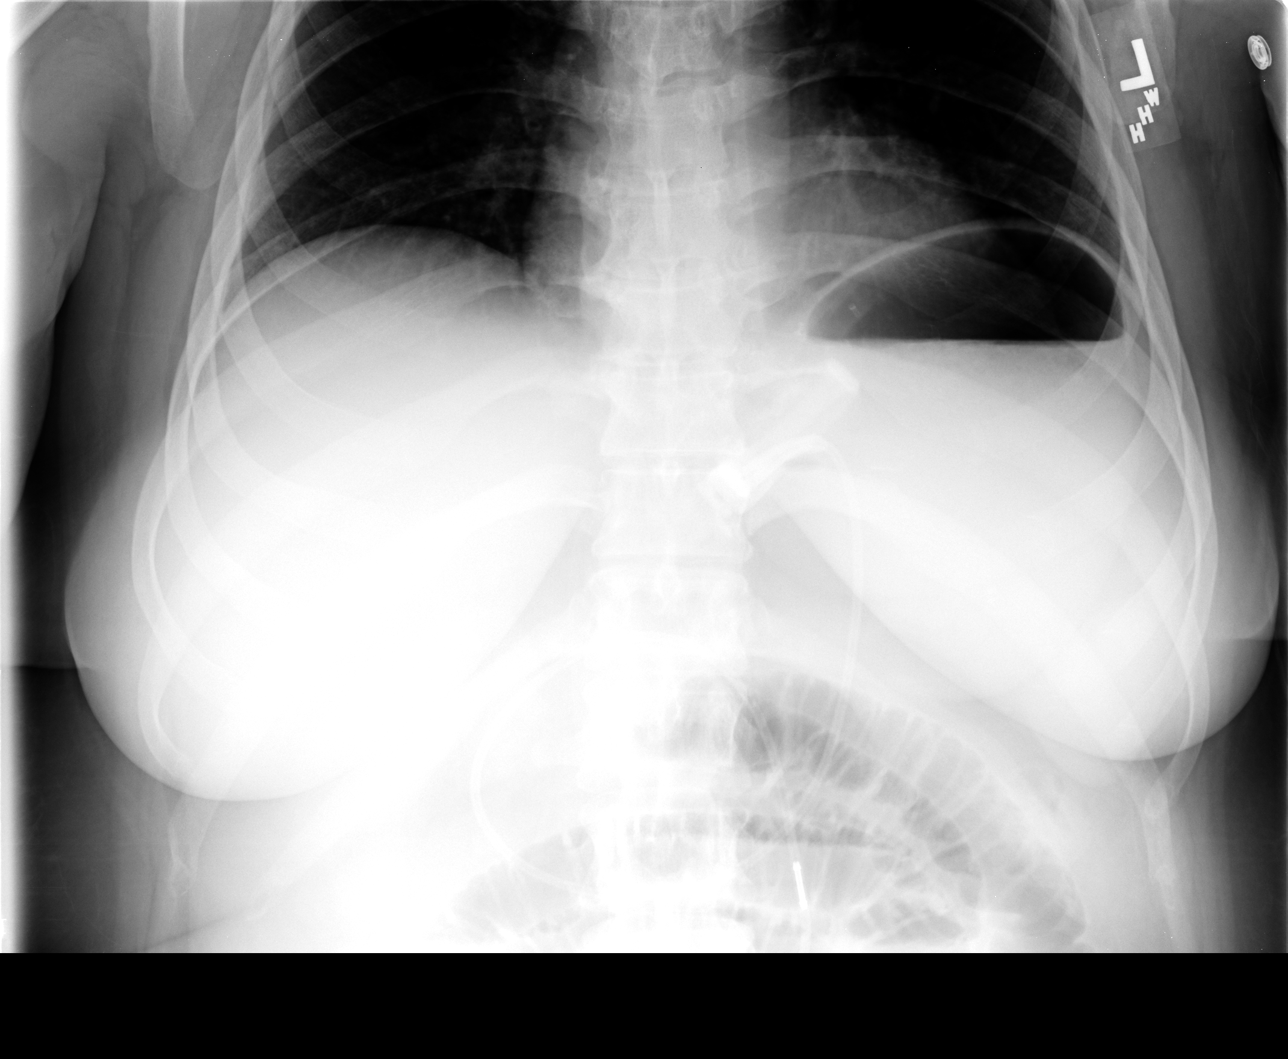

[view not recorded (3 of 4)]
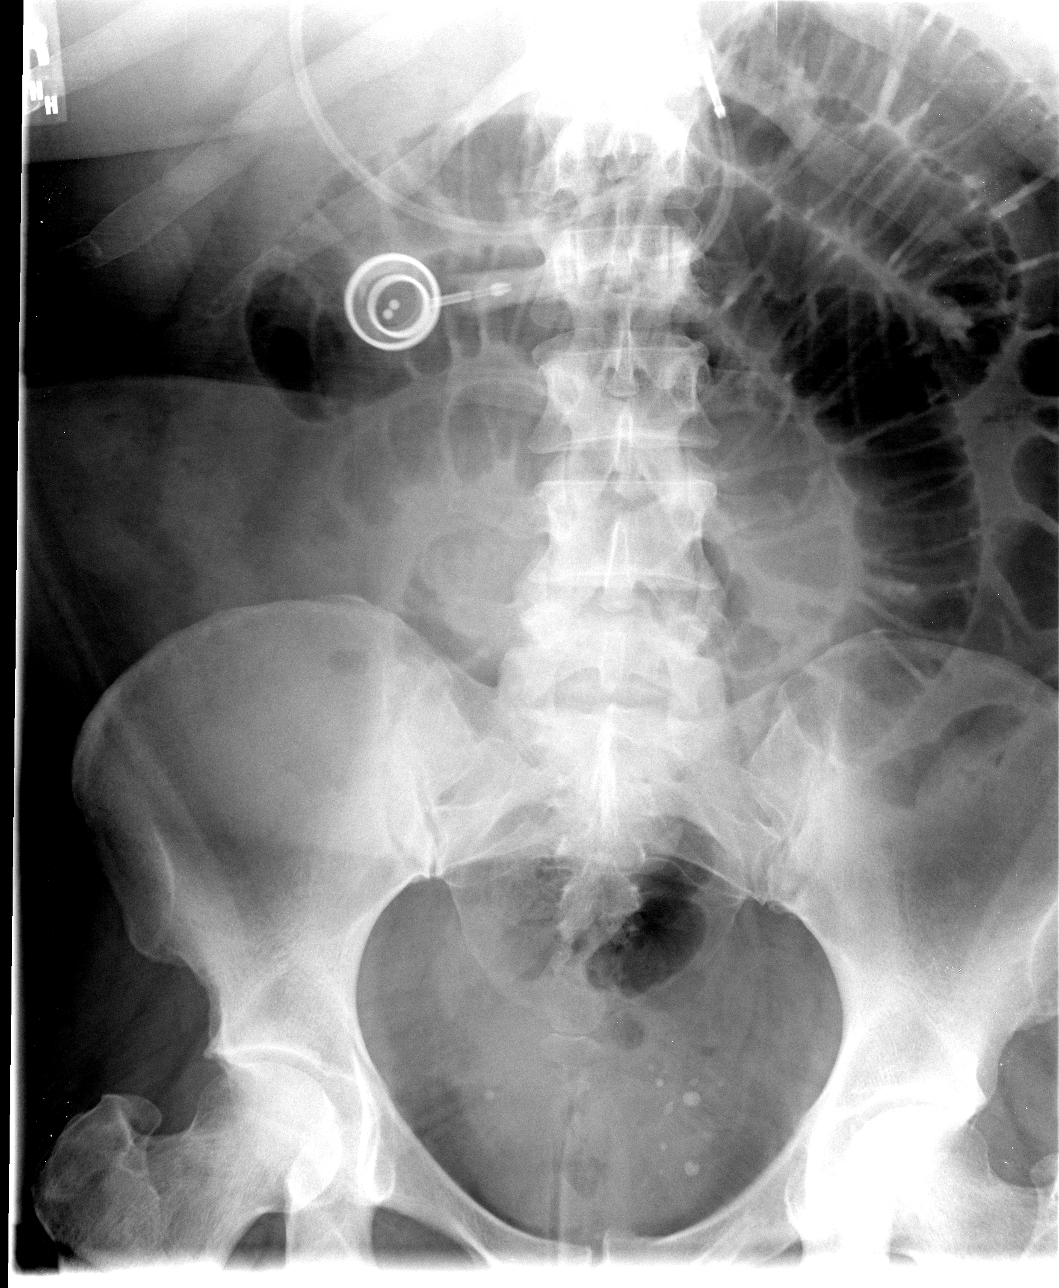

[view not recorded (4 of 4)]
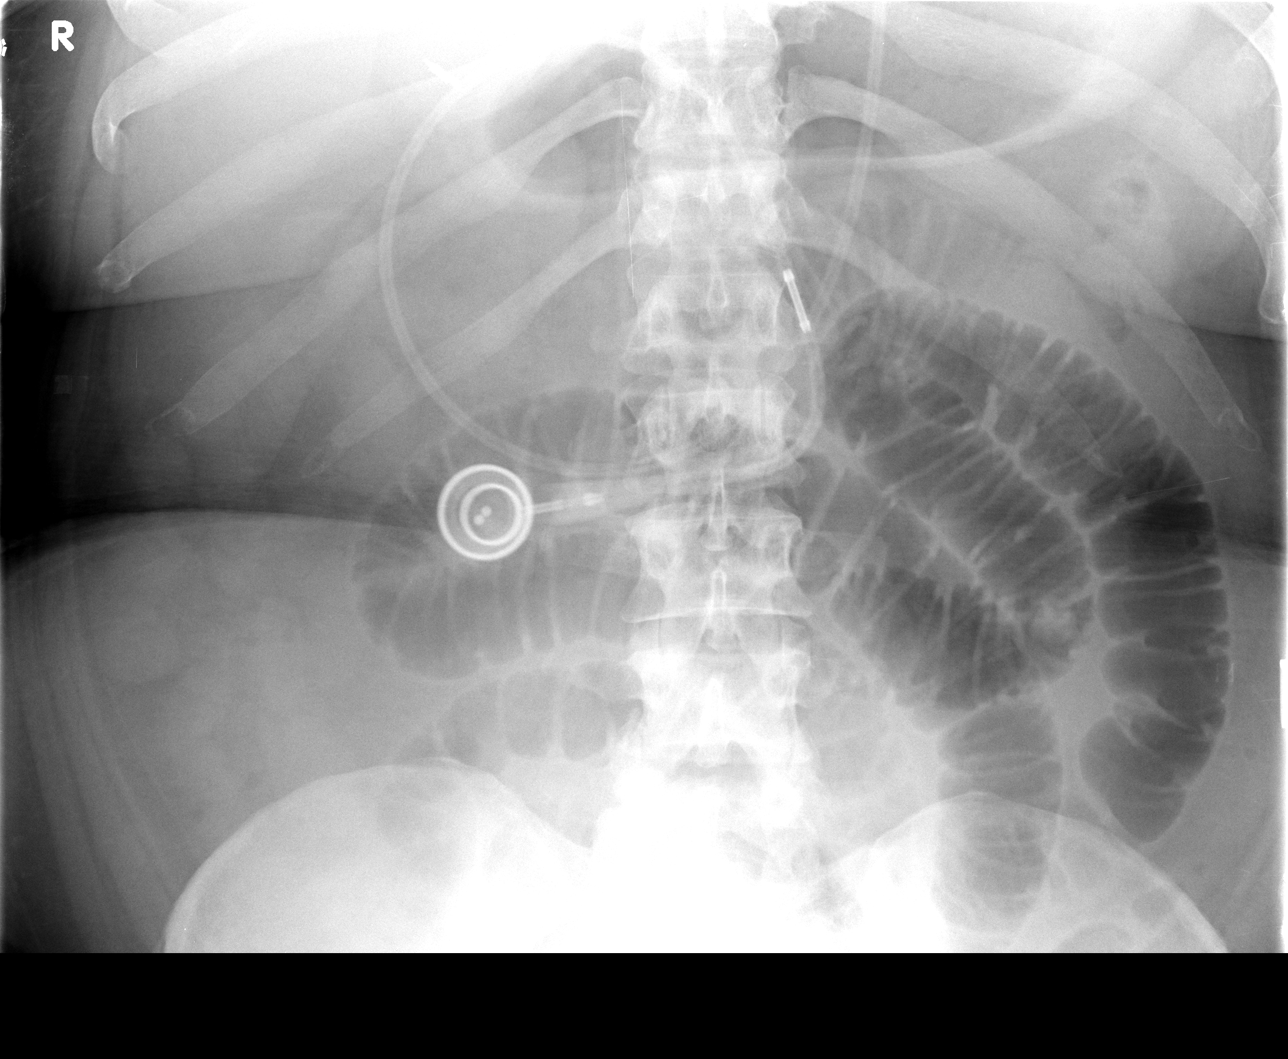

[4 of 4 positions shown; findings below may reference images not displayed]

FINDINGS: The lungs are well-aerated and clear.  There is no
evidence of focal opacification, pleural effusion or pneumothorax.
The cardiomediastinal silhouette is within normal limits.

There is distension of small bowel loops to 4.6 cm in maximal
diameter, with a relative paucity of bowel gas in the colon; this
raises question for high-grade small bowel obstruction, possibly
due to adhesions given the patient's surgical history. Stool and
air are noted throughout the colon; there is no evidence of small
bowel dilatation to suggest obstruction.  No free intra-abdominal
air is identified on the provided upright view.

The patient's gastric band remains in expected alignment.  Clips
are noted within the right upper quadrant, reflecting prior
cholecystectomy.

No acute osseous abnormalities are seen; the sacroiliac joints are
unremarkable in appearance.
IMPRESSION: Distension of small bowel loops to 4.6 cm in maximal diameter, with
a relative paucity of bowel gas in the colon; this raises question
for high-grade small bowel obstruction, possibly due to adhesions.
2.  Gastric band remains in expected alignment.
3.  No acute cardiopulmonary process seen.

Findings were discussed with Dr. Inmer Mc at [DATE] a.m. on
02/06/2011.

## 2013-07-17 ENCOUNTER — Emergency Department (HOSPITAL_COMMUNITY): Payer: BC Managed Care – PPO

## 2013-07-17 ENCOUNTER — Inpatient Hospital Stay (HOSPITAL_COMMUNITY)
Admission: EM | Admit: 2013-07-17 | Discharge: 2013-07-18 | DRG: 392 | Disposition: A | Discharge status: Home / Self Care | Payer: BC Managed Care – PPO | Attending: Family Medicine | Admitting: Family Medicine

## 2013-07-17 ENCOUNTER — Encounter (HOSPITAL_COMMUNITY): Payer: Self-pay | Admitting: Emergency Medicine

## 2013-07-17 DIAGNOSIS — Z9884 Bariatric surgery status: Secondary | ICD-10-CM

## 2013-07-17 DIAGNOSIS — R739 Hyperglycemia, unspecified: Secondary | ICD-10-CM | POA: Diagnosis present

## 2013-07-17 DIAGNOSIS — D72829 Elevated white blood cell count, unspecified: Secondary | ICD-10-CM | POA: Diagnosis present

## 2013-07-17 DIAGNOSIS — R109 Unspecified abdominal pain: Secondary | ICD-10-CM

## 2013-07-17 DIAGNOSIS — R000 Tachycardia, unspecified: Secondary | ICD-10-CM | POA: Diagnosis present

## 2013-07-17 DIAGNOSIS — Z8249 Family history of ischemic heart disease and other diseases of the circulatory system: Secondary | ICD-10-CM

## 2013-07-17 DIAGNOSIS — K7689 Other specified diseases of liver: Secondary | ICD-10-CM | POA: Diagnosis present

## 2013-07-17 DIAGNOSIS — K529 Noninfective gastroenteritis and colitis, unspecified: Secondary | ICD-10-CM | POA: Diagnosis present

## 2013-07-17 DIAGNOSIS — K5289 Other specified noninfective gastroenteritis and colitis: Principal | ICD-10-CM | POA: Diagnosis present

## 2013-07-17 DIAGNOSIS — Z833 Family history of diabetes mellitus: Secondary | ICD-10-CM

## 2013-07-17 DIAGNOSIS — E559 Vitamin D deficiency, unspecified: Secondary | ICD-10-CM | POA: Diagnosis present

## 2013-07-17 DIAGNOSIS — E785 Hyperlipidemia, unspecified: Secondary | ICD-10-CM | POA: Diagnosis present

## 2013-07-17 DIAGNOSIS — I10 Essential (primary) hypertension: Secondary | ICD-10-CM | POA: Diagnosis present

## 2013-07-17 DIAGNOSIS — E871 Hypo-osmolality and hyponatremia: Secondary | ICD-10-CM | POA: Diagnosis present

## 2013-07-17 DIAGNOSIS — Z6837 Body mass index (BMI) 37.0-37.9, adult: Secondary | ICD-10-CM

## 2013-07-17 DIAGNOSIS — R630 Anorexia: Secondary | ICD-10-CM | POA: Diagnosis present

## 2013-07-17 DIAGNOSIS — R112 Nausea with vomiting, unspecified: Secondary | ICD-10-CM

## 2013-07-17 DIAGNOSIS — E876 Hypokalemia: Secondary | ICD-10-CM | POA: Diagnosis not present

## 2013-07-17 DIAGNOSIS — Z794 Long term (current) use of insulin: Secondary | ICD-10-CM

## 2013-07-17 DIAGNOSIS — K5000 Crohn's disease of small intestine without complications: Secondary | ICD-10-CM

## 2013-07-17 DIAGNOSIS — E119 Type 2 diabetes mellitus without complications: Secondary | ICD-10-CM | POA: Diagnosis present

## 2013-07-17 DIAGNOSIS — R197 Diarrhea, unspecified: Secondary | ICD-10-CM

## 2013-07-17 DIAGNOSIS — E669 Obesity, unspecified: Secondary | ICD-10-CM | POA: Diagnosis present

## 2013-07-17 LAB — CBC WITH DIFFERENTIAL/PLATELET
Basophils Relative: 0 % (ref 0–1)
Eosinophils Absolute: 0.1 10*3/uL (ref 0.0–0.7)
Eosinophils Relative: 1 % (ref 0–5)
Lymphs Abs: 1.7 10*3/uL (ref 0.7–4.0)
MCH: 28.3 pg (ref 26.0–34.0)
MCHC: 33.3 g/dL (ref 30.0–36.0)
MCV: 85 fL (ref 78.0–100.0)
Neutrophils Relative %: 83 % — ABNORMAL HIGH (ref 43–77)
Platelets: 259 10*3/uL (ref 150–400)
RBC: 5.01 MIL/uL (ref 3.87–5.11)

## 2013-07-17 LAB — COMPREHENSIVE METABOLIC PANEL
ALT: 8 U/L (ref 0–35)
Albumin: 3.1 g/dL — ABNORMAL LOW (ref 3.5–5.2)
BUN: 8 mg/dL (ref 6–23)
Calcium: 9 mg/dL (ref 8.4–10.5)
GFR calc Af Amer: >90 mL/min (ref >90)
Glucose, Bld: 341 mg/dL — ABNORMAL HIGH (ref 70–99)
Potassium: 3.7 mEq/L (ref 3.5–5.1)
Sodium: 134 mEq/L — ABNORMAL LOW (ref 135–145)
Total Protein: 7.1 g/dL (ref 6.0–8.3)

## 2013-07-17 LAB — URINALYSIS, ROUTINE W REFLEX MICROSCOPIC
Nitrite: NEGATIVE
Specific Gravity, Urine: 1.02 (ref 1.005–1.030)
Urobilinogen, UA: 0.2 mg/dL (ref 0.0–1.0)
pH: 5.5 (ref 5.0–8.0)

## 2013-07-17 LAB — LIPID PANEL
Cholesterol: 158 mg/dL (ref 0–200)
HDL: 46 mg/dL (ref >39)
LDL Cholesterol: 68 mg/dL (ref 0–99)
Total CHOL/HDL Ratio: 3.4 RATIO

## 2013-07-17 LAB — LACTIC ACID, PLASMA: Lactic Acid, Venous: 3.1 mmol/L — ABNORMAL HIGH (ref 0.5–2.2)

## 2013-07-17 LAB — POCT PREGNANCY, URINE: Preg Test, Ur: NEGATIVE

## 2013-07-17 LAB — GLUCOSE, CAPILLARY: Glucose-Capillary: 319 mg/dL — ABNORMAL HIGH (ref 70–99)

## 2013-07-17 LAB — LIPASE, BLOOD: Lipase: 24 U/L (ref 11–59)

## 2013-07-17 LAB — SEDIMENTATION RATE: Sed Rate: 10 mm/hr (ref 0–22)

## 2013-07-17 MED ORDER — IOHEXOL 300 MG/ML  SOLN
100.0000 mL | Freq: Once | INTRAMUSCULAR | Status: AC | PRN
  Administered 2013-07-17: 100 mL via INTRAVENOUS

## 2013-07-17 MED ORDER — IOHEXOL 300 MG/ML  SOLN
50.0000 mL | Freq: Once | INTRAMUSCULAR | Status: AC | PRN
  Administered 2013-07-17: 50 mL via ORAL

## 2013-07-17 MED ORDER — CIPROFLOXACIN IN D5W 400 MG/200ML IV SOLN
400.0000 mg | Freq: Once | INTRAVENOUS | Status: AC
  Administered 2013-07-17: 400 mg via INTRAVENOUS
  Filled 2013-07-17: qty 200

## 2013-07-17 MED ORDER — SODIUM CHLORIDE 0.9 % IV BOLUS (SEPSIS)
1000.0000 mL | Freq: Once | INTRAVENOUS | Status: AC
  Administered 2013-07-17: 1000 mL via INTRAVENOUS

## 2013-07-17 MED ORDER — AMLODIPINE BESYLATE 5 MG PO TABS
5.0000 mg | ORAL_TABLET | Freq: Every day | ORAL | Status: DC
  Administered 2013-07-17 – 2013-07-18 (×2): 5 mg via ORAL
  Filled 2013-07-17 (×2): qty 1

## 2013-07-17 MED ORDER — ONDANSETRON HCL 4 MG/2ML IJ SOLN
4.0000 mg | Freq: Four times a day (QID) | INTRAMUSCULAR | Status: DC | PRN
  Administered 2013-07-17: 4 mg via INTRAVENOUS
  Filled 2013-07-17: qty 2

## 2013-07-17 MED ORDER — ONDANSETRON HCL 4 MG/2ML IJ SOLN
4.0000 mg | Freq: Once | INTRAMUSCULAR | Status: AC
  Administered 2013-07-17: 4 mg via INTRAVENOUS
  Filled 2013-07-17: qty 2

## 2013-07-17 MED ORDER — INSULIN DETEMIR 100 UNIT/ML ~~LOC~~ SOLN
30.0000 [IU] | Freq: Every day | SUBCUTANEOUS | Status: DC
  Administered 2013-07-17 – 2013-07-18 (×2): 30 [IU] via SUBCUTANEOUS
  Filled 2013-07-17 (×3): qty 0.3

## 2013-07-17 MED ORDER — SODIUM CHLORIDE 0.9 % IV SOLN
Freq: Once | INTRAVENOUS | Status: AC
  Administered 2013-07-17: 07:00:00 via INTRAVENOUS

## 2013-07-17 MED ORDER — METRONIDAZOLE IN NACL 5-0.79 MG/ML-% IV SOLN
500.0000 mg | Freq: Three times a day (TID) | INTRAVENOUS | Status: DC
  Administered 2013-07-17 – 2013-07-18 (×2): 500 mg via INTRAVENOUS
  Filled 2013-07-17 (×3): qty 100

## 2013-07-17 MED ORDER — ENOXAPARIN SODIUM 40 MG/0.4ML ~~LOC~~ SOLN
40.0000 mg | SUBCUTANEOUS | Status: DC
  Administered 2013-07-17 – 2013-07-18 (×2): 40 mg via SUBCUTANEOUS
  Filled 2013-07-17 (×2): qty 0.4

## 2013-07-17 MED ORDER — ACETAMINOPHEN 650 MG RE SUPP: 650.0000 mg | Freq: Four times a day (QID) | RECTAL | Status: DC | PRN

## 2013-07-17 MED ORDER — INSULIN ASPART 100 UNIT/ML ~~LOC~~ SOLN: 0.0000 [IU] | Freq: Every day | SUBCUTANEOUS | Status: DC

## 2013-07-17 MED ORDER — METOPROLOL TARTRATE 1 MG/ML IV SOLN
2.5000 mg | Freq: Three times a day (TID) | INTRAVENOUS | Status: DC
  Administered 2013-07-17: 2.5 mg via INTRAVENOUS
  Filled 2013-07-17: qty 5

## 2013-07-17 MED ORDER — ACETAMINOPHEN 325 MG PO TABS
650.0000 mg | ORAL_TABLET | Freq: Four times a day (QID) | ORAL | Status: DC | PRN
  Administered 2013-07-18: 650 mg via ORAL
  Filled 2013-07-17: qty 2

## 2013-07-17 MED ORDER — INSULIN ASPART 100 UNIT/ML ~~LOC~~ SOLN
0.0000 [IU] | Freq: Three times a day (TID) | SUBCUTANEOUS | Status: DC
  Administered 2013-07-17: 5 [IU] via SUBCUTANEOUS
  Administered 2013-07-18 (×2): 3 [IU] via SUBCUTANEOUS

## 2013-07-17 MED ORDER — METRONIDAZOLE IN NACL 5-0.79 MG/ML-% IV SOLN
500.0000 mg | Freq: Once | INTRAVENOUS | Status: DC
  Administered 2013-07-17: 500 mg via INTRAVENOUS
  Filled 2013-07-17: qty 100

## 2013-07-17 MED ORDER — LISINOPRIL 10 MG PO TABS
40.0000 mg | ORAL_TABLET | Freq: Every day | ORAL | Status: DC
  Administered 2013-07-17 – 2013-07-18 (×2): 40 mg via ORAL
  Filled 2013-07-17 (×2): qty 4

## 2013-07-17 MED ORDER — SODIUM CHLORIDE 0.9 % IV SOLN
INTRAVENOUS | Status: DC
  Administered 2013-07-17 – 2013-07-18 (×4): via INTRAVENOUS

## 2013-07-17 MED ORDER — AMITRIPTYLINE HCL 10 MG PO TABS
10.0000 mg | ORAL_TABLET | Freq: Every day | ORAL | Status: DC
  Administered 2013-07-17 – 2013-07-18 (×2): 10 mg via ORAL
  Filled 2013-07-17 (×2): qty 1

## 2013-07-17 MED ORDER — CIPROFLOXACIN IN D5W 400 MG/200ML IV SOLN
400.0000 mg | Freq: Two times a day (BID) | INTRAVENOUS | Status: DC
  Administered 2013-07-18: 400 mg via INTRAVENOUS
  Filled 2013-07-17 (×2): qty 200

## 2013-07-17 MED ORDER — INSULIN ASPART 100 UNIT/ML ~~LOC~~ SOLN: 0.0000 [IU] | Freq: Three times a day (TID) | SUBCUTANEOUS | Status: DC

## 2013-07-17 MED ORDER — HYDROMORPHONE HCL PF 1 MG/ML IJ SOLN
0.5000 mg | INTRAMUSCULAR | Status: DC | PRN
  Administered 2013-07-17 – 2013-07-18 (×3): 0.5 mg via INTRAVENOUS
  Filled 2013-07-17 (×3): qty 1

## 2013-07-17 MED ORDER — ONDANSETRON HCL 4 MG PO TABS: 4.0000 mg | ORAL_TABLET | Freq: Four times a day (QID) | ORAL | Status: DC | PRN

## 2013-07-17 MED ORDER — KETOROLAC TROMETHAMINE 30 MG/ML IJ SOLN
30.0000 mg | Freq: Once | INTRAMUSCULAR | Status: AC
  Administered 2013-07-17: 30 mg via INTRAVENOUS
  Filled 2013-07-17: qty 1

## 2013-07-17 NOTE — ED Notes (Signed)
Pt states these symptoms are the same as when she was diagnosed with an ulcer in her lower colon by Dr. Darrick Penna.  States pain/nausea/diarrhea started 2 days ago. Pt also stated she has not taken her insulin today and she was expecting a high number on her CBG, pt's CBG was 319.

## 2013-07-17 NOTE — ED Notes (Signed)
Beeped Dr. Darrick Penna through Office.

## 2013-07-17 NOTE — ED Provider Notes (Signed)
CSN: [id number]     Arrival date & time 07/17/13  6295 History  This chart was scribed for Benny Lennert, MD by Quintella Reichert, ED scribe.  This patient was seen in room APA04/APA04 and the patient's care was started at 7:08 AM.   Chief Complaint  Patient presents with  . Nausea  . Emesis  . Abdominal Pain    Patient is a 47 y.o. female presenting with abdominal pain. The history is provided by the patient. No language interpreter was used.  Abdominal Pain Pain location:  Generalized Pain severity:  Severe Duration: 2 nights ago. Progression:  Worsening Chronicity:  Recurrent (pt states similar pain in association with prior diagnosis of ulcers) Associated symptoms: diarrhea, nausea and vomiting   Associated symptoms: no chest pain, no cough, no fatigue and no hematuria   Risk factors: multiple surgeries     HPI Comments: Otila Starn Stump is a 47 y.o. female who presents to the Emergency Department complaining of one day of gradual-onset, gradually-worsening nausea, vomiting, diarrhea and abdominal pain.  Nausea began 2 days ago and her other symptoms began that night.  Stool is described as watery and thin in episodes occurring 4-5 times per day.  Pain is severe.  Pt has h/o ulcer in lower intestine and had similar symptoms prior to being diagnosed with that over a year ago.  She states she was otherwise doing well before  2 days ago.   Past Medical History  Diagnosis Date  . 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 DM (diabetes mellitus)   . HTN (hypertension)   . Hyperlipemia   . Vitamin D deficiency   . Diverticulosis OCT 2011     PAN-COLONIC  . Obesity, Class III, BMI 40-49.9 (morbid obesity) AUG 2010 305 LBS    MAY 2012 267    Past Surgical History  Procedure Laterality Date  . Upper gastrointestinal endoscopy  MAY 2012 GIVENS CAPSULE PLACEMENT    GASTRITIS  . Laparoscopic gastric banding  AUG 2010 BMI 50    GASTRIC POUCH 2-3 CM  .  Umbilical hernia repair  AUG 2010 MATTHEW MARTIN    INCARCERATED OMENTUM  . Cholecystectomy    . Abdominal exploration surgery  May 05, 2009 MM    INFLAMMED TI (SEROSITIS), "DIDN'T LOOK LIKE CROHN'S"  . Colonoscopy  DEC 2010 WO  . Colonoscopy  OCT 2011    MILD ERYTHEMA IN TI (30-40 CM), NL SB/COLON Bx    Family History  Problem Relation Age of Onset  . Colon cancer Neg Hx   . Colon polyps Neg Hx     History  Substance Use Topics  . Smoking status: Never Smoker   . Smokeless tobacco: Not on file  . Alcohol Use: No    OB History   Grav Para Term Preterm Abortions TAB SAB Ect Mult Living                  Review of Systems  Constitutional: Negative for appetite change and fatigue.  HENT: Negative for congestion, ear discharge and sinus pressure.   Eyes: Negative for discharge.  Respiratory: Negative for cough.   Cardiovascular: Negative for chest pain.  Gastrointestinal: Positive for nausea, vomiting, abdominal pain and diarrhea.  Genitourinary: Negative for frequency and hematuria.  Musculoskeletal: Negative for back pain.  Skin: Negative for rash.  Neurological: Negative for seizures and headaches.  Psychiatric/Behavioral: Negative for hallucinations.     Allergies  Review of patient's allergies indicates no known allergies.  Home Medications   Current Outpatient Rx  Name  Route  Sig  Dispense  Refill  . amLODipine (NORVASC) 5 MG tablet   Oral   Take 5 mg by mouth daily.         . insulin detemir (LEVEMIR) 100 UNIT/ML injection   Subcutaneous   Inject 60 Units into the skin daily after breakfast.           . lisinopril (PRINIVIL,ZESTRIL) 40 MG tablet               . Cholecalciferol (VITAMIN D) 2000 UNITS tablet   Oral   Take 2,000 Units by mouth daily.           Marland Kitchen MONONESSA 0.25-35 MG-MCG per tablet                BP 193/92  Pulse 95  Temp(Src) 97.9 F (36.6 C) (Oral)  Resp 40  Ht 5\' 6"  (1.676 m)  Wt 230 lb (104.327 kg)  BMI 37.14  kg/m2  SpO2 98%  LMP 06/16/2013  Physical Exam  Constitutional: She is oriented to person, place, and time. She appears well-developed.  HENT:  Head: Normocephalic.  Mouth/Throat: Mucous membranes are dry.  MMS dry  Eyes: Conjunctivae and EOM are normal. No scleral icterus.  Neck: Neck supple. No thyromegaly present.  Cardiovascular: Normal rate and regular rhythm.  Exam reveals no gallop and no friction rub.   No murmur heard. Pulmonary/Chest: No stridor. She has no wheezes. She has no rales. She exhibits no tenderness.  Abdominal: She exhibits distension. There is tenderness. There is no rebound.  Mildly distended abdomen. Moderately tender throughout.  Musculoskeletal: Normal range of motion. She exhibits no edema.  Lymphadenopathy:    She has no cervical adenopathy.  Neurological: She is oriented to person, place, and time. She exhibits normal muscle tone. Coordination normal.  Skin: No rash noted. No erythema.  Psychiatric: She has a normal mood and affect. Her behavior is normal.    ED Course  Procedures (including critical care time)  DIAGNOSTIC STUDIES: Oxygen Saturation is 98% on room air, normal by my interpretation.    COORDINATION OF CARE: 7:12 AM-Discussed treatment plan which includes pain medication, anti-emetics and labs with pt at bedside and pt agreed to plan.    Labs Review Labs Reviewed  CBC WITH DIFFERENTIAL - Abnormal; Notable for the following:    WBC 13.9 (*)    Neutrophils Relative % 83 (*)    Neutro Abs 11.6 (*)    All other components within normal limits  COMPREHENSIVE METABOLIC PANEL  LIPASE, BLOOD  URINALYSIS, ROUTINE W REFLEX MICROSCOPIC  LACTIC ACID, PLASMA   Imaging Review No results found.  EKG Interpretation   None       MDM  No diagnosis found.  The chart was scribed for me under my direct supervision.  I personally performed the history, physical, and medical decision making and all procedures in the evaluation of this  patient.Benny Lennert, MD 07/17/13 [phone]

## 2013-07-17 NOTE — Consult Note (Signed)
Referring Provider: No ref. provider found Primary Care Physician:  Kirstie Peri, MD Primary Gastroenterologist:  Dr. Darrick Penna.  Reason for Consultation:  Recurrent ileitis.  HPI:  Patient is 47 year old Caucasian female who has history of ileitis reviewed under past medical history was in usual state of health until 2 days ago when she developed nausea and bloating after lunch. She did not experience fever or chills. Later that evening she had 3 loose watery stools and developed severe vomiting across her mid abdomen. With the onset of this pain she began to heave but did not vomit. Yesterday she stayed on clear liquids and felt much better. However she woke up 3 AM this morning with excruciating cramps across her upper abdomen and heaving. Once again she did not experience fever chills sweating or skin rash. She came to the emergency room for evaluation. Abdominal pelvic CT with contrast was obtained and showed wall thickening 2 segments of ileum with skip lesion pattern. She also had scant amount of free fluid in her abdomen and pelvis as well as fatty liver and evidence of gastric lap banding. Patient was admitted to Dr. Rene Kocher service and begun on IV fluids antibiotics analgesics and antibiotics. This afternoon she feels much better. She now complains of sore abdomen and soreness in walls lower half of the abdomen. Review of the systems is negative for rectal bleeding, skin rash or joint pains as well as anorexia or weight loss. She does not take OTC NSAIDs and rarely takes Tylenol for headache. There is no history of recent antibiotic use. She states she has been on lisinopril for at least 10 years. She states that she has not experienced similar symptoms for about 2 years. She was on Pentasa for about 10 months which she discontinued because of cost. She states since stopping Pentasa she had no symptoms until now(or 2 years). Patient states that she lost 120 pounds following laparoscopic lap band  placement He does not smoke cigarettes or drink alcohol. She is married and does not have any children. She works at [employer]. She has one brother with hypertension and hyperlipidemia. Prior GI history is as follows; Patient began to have more or less similar symptoms in 2009. She underwent laparoscopic LAP-BAND in 04/28/2009 and one week later she had diagnostic laparoscopy when she presented with abdominal pain and thickening small bowel. Dr. Luretha Murphy noted she had inflammatory changes to her distal small bowel but not typical of Crohn's disease. She was subsequently evaluated by Dr. Willis Modena and had following studies.   --Normal upper GI and small bowel follow-through in      November 2010.   --Normal esophagogastroduodenoscopy and colonoscopy     revealing pancolonic diverticulosis. Terminal ileum was     reportedly normal.    She was admitted to [facility] in October 2011 was seen by Dr. Jena Gauss and Dr. Darrick Penna and had following workup. --- Colonoscopy revealed pancolonic diverticulosis. She was able to examine TI for 30-40 cm and noted mild ileitis. However ileal and colonic biopsies did not reveal any abnormality. She underwent small bowel given capsule study in July 2012 but given capsule stayed in gastric pouch. She therefore had endoscopic placement of given capsule. EGD did show esophagitis and gastritis but biopsies are negative for H. pylori infection. Given capsule study reveals ulcers involving terminal ileum IBD panel in July 2012 was negative. Patient was begun on Pentasa in July 2012 with resolution of her abdominal pain. She stayed on Pentasa for 10 months and had  no symptoms whatsoever. As noted above she stopped Pentasa because of cost. She also did not return for followup visit with Dr. Darrick Penna   Past Medical History  Diagnosis Date  . 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 DM (diabetes mellitus)   . HTN (hypertension)   . Hyperlipemia    . Vitamin D deficiency   . Diverticulosis OCT 2011     PAN-COLONIC  . Obesity, Class III, BMI 40-49.9 (morbid obesity) AUG 2010 305 LBS    MAY 2012 267    Past Surgical History  Procedure Laterality Date  . Upper gastrointestinal endoscopy  MAY 2012 GIVENS CAPSULE PLACEMENT    GASTRITIS  . Laparoscopic gastric banding  AUG 2010 BMI 50    GASTRIC POUCH 2-3 CM  . Umbilical hernia repair  AUG 2010 MATTHEW MARTIN    INCARCERATED OMENTUM  . Cholecystectomy    . Abdominal exploration surgery  May 05, 2009 MM    INFLAMMED TI (SEROSITIS), "DIDN'T LOOK LIKE CROHN'S"  . Colonoscopy  DEC 2010 WO  . Colonoscopy  OCT 2011    MILD ERYTHEMA IN TI (30-40 CM), NL SB/COLON Bx    Prior to Admission medications   Medication Sig Start Date End Date Taking? Authorizing Provider  amitriptyline (ELAVIL) 10 MG tablet Take 1 tablet by mouth daily. 06/02/13  Yes Historical Provider, MD  amLODipine (NORVASC) 5 MG tablet Take 5 mg by mouth daily.   Yes Historical Provider, MD  insulin aspart (NOVOLOG) 100 UNIT/ML injection Inject 2-10 Units into the skin See admin instructions. Sliding Scale: 0-200 = 2 units, 201-300 = 4 units, 301-350 = 6 units, 351-400 = 8 units, >401=10 units.   Yes Historical Provider, MD  insulin detemir (LEVEMIR) 100 UNIT/ML injection Inject 60 Units into the skin daily after breakfast.     Yes Historical Provider, MD  lisinopril (PRINIVIL,ZESTRIL) 40 MG tablet Take 40 mg by mouth daily.  02/12/11  Yes Historical Provider, MD  norgestimate-ethinyl estradiol (SPRINTEC 28) 0.25-35 MG-MCG tablet Take 1 tablet by mouth daily.   Yes Historical Provider, MD    Current Facility-Administered Medications  Medication Dose Route Frequency Provider Last Rate Last Dose  . 0.9 %  sodium chloride infusion   Intravenous Continuous Gwenyth Bender, NP 125 mL/hr at 07/17/13 1451    . acetaminophen (TYLENOL) tablet 650 mg  650 mg Oral Q6H PRN Gwenyth Bender, NP       Or  . acetaminophen (TYLENOL)  suppository 650 mg  650 mg Rectal Q6H PRN Gwenyth Bender, NP      . Melene Muller ON 07/18/2013] ciprofloxacin (CIPRO) IVPB 400 mg  400 mg Intravenous Q12H Lesle Chris Black, NP      . enoxaparin (LOVENOX) injection 40 mg  40 mg Subcutaneous Q24H Gwenyth Bender, NP   40 mg at 07/17/13 1602  . HYDROmorphone (DILAUDID) injection 0.5 mg  0.5 mg Intravenous Q2H PRN Lesle Chris Black, NP      . insulin aspart (novoLOG) injection 0-15 Units  0-15 Units Subcutaneous TID WC Standley Brooking, MD      . insulin aspart (novoLOG) injection 0-5 Units  0-5 Units Subcutaneous QHS Lesle Chris Black, NP      . insulin detemir (LEVEMIR) injection 30 Units  30 Units Subcutaneous Q breakfast Lesle Chris Black, NP      . metoprolol (LOPRESSOR) injection 2.5 mg  2.5 mg Intravenous Q8H Gwenyth Bender, NP  2.5 mg at 07/17/13 1557  . metroNIDAZOLE (FLAGYL) IVPB 500 mg  500 mg Intravenous Q8H Lesle Chris Black, NP      . ondansetron [facility]) tablet 4 mg  4 mg Oral Q6H PRN Gwenyth Bender, NP       Or  . ondansetron [facility]) injection 4 mg  4 mg Intravenous Q6H PRN Gwenyth Bender, NP        Allergies as of 07/17/2013  . (No Known Allergies)    Family History  Problem Relation Age of Onset  . Colon cancer Neg Hx   . Colon polyps Neg Hx     History   Social History  . Marital Status: Married    Spouse Name: N/A    Number of Children: N/A  . Years of Education: N/A   Occupational History  . Not on file.   Social History Main Topics  . Smoking status: Never Smoker   . Smokeless tobacco: Not on file  . Alcohol Use: No  . Drug Use: No  . Sexual Activity: Not on file   Other Topics Concern  . Not on file   Social History Narrative   MARRIED, NO KIDS    Review of Systems: See HPI, otherwise normal ROS  Physical Exam: Temp:  [97.9 F (36.6 C)-98.2 F (36.8 C)] 98.2 F (36.8 C) (10/29 1438) Pulse Rate:  [67-95] 67 (10/29 1556) Resp:  [20-40] 20 (10/29 1438) BP: (161-193)/(82-101) 161/101 mmHg (10/29 1438) SpO2:  [98 %-100  %] 100 % (10/29 1438) Weight:  [230 lb (104.327 kg)] 230 lb (104.327 kg) (10/29 2956) Pleasant well-developed obese Caucasian female who is in no acute distress. Conjunctiva is pink. Sclerae nonicteric. Oropharyngeal mucosa is normal. No neck masses or thyromegaly noted. Cardiac exam with a regular rhythm normal S1 and S2. No murmur or gallop noted. Lungs are clear to auscultation. Abdomen is full. She has ecchymosis in pigmentation to skin of lower abdomen from insulin injections. Bowel sounds are normal. No bruits noted. Abdomen is soft with mild tenderness involving the lower half of the abdomen. No guarding or rebound noted. No organomegaly or masses. No LE edema or skin rash noted.  Lab Results:  Recent Labs  07/17/13 0701  WBC 13.9*  HGB 14.2  HCT 42.6  PLT 259   BMET  Recent Labs  07/17/13 0701  NA 134*  K 3.7  CL 98  CO2 21  GLUCOSE 341*  BUN 8  CREATININE 0.67  CALCIUM 9.0   LFT  Recent Labs  07/17/13 0701  PROT 7.1  ALBUMIN 3.1*  AST 12  ALT 8  ALKPHOS 74  BILITOT 0.4   PT/INR No results found for this basename: LABPROT, INR,  in the last 72 hours Hepatitis Panel No results found for this basename: HEPBSAG, HCVAB, HEPAIGM, HEPBIGM,  in the last 72 hours  Studies/Results: Ct Abdomen Pelvis W Contrast  07/17/2013   CLINICAL DATA:  Abdominal pain. History of gastric lap band.  EXAM: CT ABDOMEN AND PELVIS WITH CONTRAST  TECHNIQUE: Multidetector CT imaging of the abdomen and pelvis was performed using the standard protocol following bolus administration of intravenous contrast.  CONTRAST:  OMNIPAQUE IOHEXOL 300 MG/ML  SOLN  COMPARISON:  02/07/2011.  FINDINGS: The lung bases are clear.  There is diffuse fatty infiltration of the liver but no focal hepatic lesions or intrahepatic biliary dilatation. The gallbladder is surgically absent. No common bile duct dilatation. The pancreas is normal except for mild atrophy. The spleen is  normal in size. No focal  lesions. Adrenal glands and kidneys are unremarkable.  The gastric lap band is well positioned and in normal orientation. No complicating features. The stomach, duodenum bulb and C-loop are normal. The proximal loops of small bowel are unremarkable. There are thickened loops of small bowel in the right abdomen extending all the way to the distal ileum. The actual terminal ileum is unremarkable. Findings most consistent with a severe enteritis. Crohn's disease is possible. There is associated free abdominal and pelvic fluid but no discrete rim enhancing abscess. No small bowel obstruction. There is mild inflammation of the cecum also. The appendix is normal. No mesenteric or retroperitoneal mass or adenopathy. Small scattered lymph nodes are noted. The aorta is normal in caliber. The major branch vessels are patent.  The uterus and ovaries are unremarkable. Small to moderate amount of free pelvic fluid. No pelvic mass or adenopathy. No inguinal mass or adenopathy.  The bony structures are unremarkable.  IMPRESSION: Significant inflammatory or infectious process involving the small bowel. No findings for functional obstruction, perforation or abscess.  Free abdominal and pelvic fluid due to be above.  No abdominal/pelvic mass or adenopathy.  Gastric lap band is well positioned without complicating features.   Electronically Signed   By: Loralie Champagne M.D.   On: 07/17/2013 12:44   I have reviewed current abdominopelvic CT as well as prior CT(she has had 5 CTs prior to this one).  Assessment; Patient is a pleasant 47 year old Caucasian female with history of diabetes mellitus who presents with acute onset of abdominal pain nausea vomiting and diarrhea and noted to have thickening to distal small bowel with skip pattern. She also has small amount of ascites. More or less similar changes are seen on prior CTs dating back to August 2010. Patient has had similar episodes in the past and documented have ulcers  involving terminal ileum and she was to Pentasa for 10 months without relapse of her symptoms. Suspect we are dealing with small bowel Crohn's disease. Since diagnosis has not been well established(negative terminal ileal biopsies and IBD panel) it would be reasonable to consider other possibilities. I doubt that she has a recurrent ischemic small bowel injury. She could have autoimmune ileitis as well as injury secondary to lisinopril. She has been on lisinopril for 10 years and therefore it's less likely. We also need to rule out selective IgA deficiency. Patient with this disorder can have recurrent GI infections.  Recommendations; Agree with empiric antibiotic therapy. Will start patient  on clear liquids. Check sedimentation rate, CRP, ANA, quantitative immunoglobulins. GI pathogen panel.  Dr. Darrick Penna will start seeing the patient as of tomorrow morning.   LOS: 0 days   Milderd Manocchio U  07/17/2013, 5:00 PM

## 2013-07-17 NOTE — H&P (Signed)
Patient seen, independently examined and chart reviewed. I agree with exam, assessment and plan discussed with [name] Smothers, NP.  There is a 47 year old woman presented with relatively acute onset of abdominal pain with nausea vomiting and diarrhea. She has had several episodes of similar symptoms, last reported 2012.  Currently she feels control. Still has some nausea. She is afebrile stable vital signs. She appears comfortable exam is benign.   Admitted for treatment of ileitis: First antibiotics. Pain control. Discussed with Dr. Stephanie Coup of Crohn's is favored based on multiple abnormal CT results from the past. Further recommendations per GI. Although her lactic acid was elevated she has no signs or symptoms to suggest sepsis. I suspect this reflects dehydration   [name] Sacks, MD [facility] [phone]

## 2013-07-17 NOTE — ED Notes (Signed)
Pt reports abdominal pain, n/v/d that started Monday. Had episode 2 years ago & dx w/ ulcers.

## 2013-07-17 NOTE — ED Notes (Signed)
Pt updated on wait status 

## 2013-07-17 NOTE — H&P (Signed)
[facility] History and Physical  [name] [name] ZOX:[id number] DOB: [birth year] DOA: 07/17/2013  Referring physician:  PCP: Kirstie Peri, MD  Specialists:   Chief Complaint: Abdominal pain/nausea/vomiting/diarrhea  HPI: [name] [name] is a very pleasant 47 y.o. female with a past medical history that includes diabetes, hypertension, hyperlipidemia, remote gastric band placement, ileitis, presents to the emergency room today with the chief complaint of persistent abdominal pain/nausea/vomiting/diarrhea. Information is obtained from the patient. She indicates that 3 days ago she developed abdominal pain. She reports the pain is located mid abdomen left and right. She describes the pain as constant with intermittent variation of intensity, sharp and cramp-like. Its worst she rates the pain a 10 out of 10 and describes this is usually when the nausea and vomiting occurs. In addition she developed diarrhea. She describes her stool as watery and slightly yellow. She denies any recent antibiotic use. She indicates that she has been having 3-4 episodes of emesis for the last 3 days and 7 episodes of diarrhea during the same timeframe. She indicates that standing up sometimes gave her relief from the abdominal pain but other than that nothing made the pain better or worse. Associated symptoms include anorexia. She denies any dysuria hematuria frequency or urgency. She denies any melena or bright red blood per rectum. She denies fever or chills headache. She indicates that she usually checks her blood sugar 2-3 times a day but frequently does not have time while at work. Over the last several days her blood sugar has been greater than 300 in spite of the fact that she's been unable to eat. Initial evaluation in the emergency department significant for white count of 13.9, sodium of 134 and glucose of 341 and lactic acid of 3.1 and lipase within the limits of normal. Urinalysis with rated a 1000 glucose 40  ketones few bacteria and few squama cell. CT of the abdomen yields significant inflammatory or infectious process involving the small bowel. Findings for functional obstruction, perforation or abscess. Small amount of free abdominal and pelvic fluid. Gastric lap band is well-positioned without complicating features. Vital signs significant for a blood pressure of 178/88 and a heart rate of 110. In the emergency department patient was given 1 L of normal saline, 400 mg of Cipro and 500 mg of Flagyl. In addition the emergency room physician contacted GI.   Review of Systems: 10 point review of systems completed and all systems are negative except as indicated in the history of present illness  Past Medical History  Diagnosis Date  . 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 DM (diabetes mellitus)   . HTN (hypertension)   . Hyperlipemia   . Vitamin D deficiency   . Diverticulosis OCT 2011     PAN-COLONIC  . Obesity, Class III, BMI 40-49.9 (morbid obesity) AUG 2010 305 LBS    MAY 2012 267   Past Surgical History  Procedure Laterality Date  . Upper gastrointestinal endoscopy  MAY 2012 GIVENS CAPSULE PLACEMENT    GASTRITIS  . Laparoscopic gastric banding  AUG 2010 BMI 50    GASTRIC POUCH 2-3 CM  . Umbilical hernia repair  AUG 2010 MATTHEW MARTIN    INCARCERATED OMENTUM  . Cholecystectomy    . Abdominal exploration surgery  May 05, 2009 MM    INFLAMMED TI (SEROSITIS), "DIDN'T LOOK LIKE CROHN'S"  . Colonoscopy  DEC 2010 WO  . Colonoscopy  OCT 2011    MILD  ERYTHEMA IN TI (30-40 CM), NL SB/COLON Bx   Social History:  reports that she has never smoked. She does not have any smokeless tobacco history on file. She reports that she does not drink alcohol or use illicit drugs. Patient is married and lives with her husband. She is a Haematologist at [employer]. She denies EtOH and tobacco or illicit drug use. She is independent with ADLs No Known Allergies  Family  History  Problem Relation Age of Onset  . Colon cancer Neg Hx   . Colon polyps Neg Hx    father is alive and in his 68s with a history of CAD status post stents, hypertension, diabetes. Mother is alive in her mid 64s medical history positive for hypertension, heart failure. She has one brother who has high blood pressure  Prior to Admission medications   Medication Sig Start Date End Date Taking? Authorizing Provider  amitriptyline (ELAVIL) 10 MG tablet Take 1 tablet by mouth daily. 06/02/13  Yes Historical Provider, MD  amLODipine (NORVASC) 5 MG tablet Take 5 mg by mouth daily.   Yes Historical Provider, MD  insulin aspart (NOVOLOG) 100 UNIT/ML injection Inject 2-10 Units into the skin See admin instructions. Sliding Scale: 0-200 = 2 units, 201-300 = 4 units, 301-350 = 6 units, 351-400 = 8 units, >401=10 units.   Yes Historical Provider, MD  insulin detemir (LEVEMIR) 100 UNIT/ML injection Inject 60 Units into the skin daily after breakfast.     Yes Historical Provider, MD  lisinopril (PRINIVIL,ZESTRIL) 40 MG tablet Take 40 mg by mouth daily.  02/12/11  Yes Historical Provider, MD  norgestimate-ethinyl estradiol (SPRINTEC 28) 0.25-35 MG-MCG tablet Take 1 tablet by mouth daily.   Yes Historical Provider, MD   Physical Exam: Filed Vitals:   07/17/13 1420  BP: 178/88  Pulse: 71  Temp:   Resp:      General:  Obese no acute distress  Eyes: PERRLA, EOMI, no scleral icterus  ENT: Ears clear nose without drainage oropharynx without erythema or exudate. Mucous membranes of her mouth are pink but dry  Neck: Supple no JVD full range of motion no lymphadenopathy  Cardiovascular: Regular rate and rhythm. No murmur no gallop no rub. Trace lower extremity edema  Respiratory: Normal effort breath sounds are clear bilaterally no rhonchi no wheeze  Abdomen: Obese soft very sluggish bowel sounds moderate tenderness to palpation mid abdomen left to right. Nontender in the upper and lower quadrants. No  rebound  Skin: Warm and dry. No rash or lesions  Musculoskeletal: Joints without erythema or swelling. Nontender to palpation. Full range of motion  Psychiatric: Calm cooperative  Neurologic: Cranial nerves II through XII grossly intact. Speech clear facial symmetry  Labs on Admission:  Basic Metabolic Panel:  Recent Labs Lab 07/17/13 0701  NA 134*  K 3.7  CL 98  CO2 21  GLUCOSE 341*  BUN 8  CREATININE 0.67  CALCIUM 9.0   Liver Function Tests:  Recent Labs Lab 07/17/13 0701  AST 12  ALT 8  ALKPHOS 74  BILITOT 0.4  PROT 7.1  ALBUMIN 3.1*    Recent Labs Lab 07/17/13 0701  LIPASE 24   No results found for this basename: AMMONIA,  in the last 168 hours CBC:  Recent Labs Lab 07/17/13 0701  WBC 13.9*  NEUTROABS 11.6*  HGB 14.2  HCT 42.6  MCV 85.0  PLT 259   Cardiac Enzymes: No results found for this basename: CKTOTAL, CKMB, CKMBINDEX, TROPONINI,  in the last  168 hours  BNP (last 3 results) No results found for this basename: PROBNP,  in the last 8760 hours CBG:  Recent Labs Lab 07/17/13 0722  GLUCAP 319*    Radiological Exams on Admission: Ct Abdomen Pelvis W Contrast  07/17/2013   CLINICAL DATA:  Abdominal pain. History of gastric lap band.  EXAM: CT ABDOMEN AND PELVIS WITH CONTRAST  TECHNIQUE: Multidetector CT imaging of the abdomen and pelvis was performed using the standard protocol following bolus administration of intravenous contrast.  CONTRAST:  OMNIPAQUE IOHEXOL 300 MG/ML  SOLN  COMPARISON:  02/07/2011.  FINDINGS: The lung bases are clear.  There is diffuse fatty infiltration of the liver but no focal hepatic lesions or intrahepatic biliary dilatation. The gallbladder is surgically absent. No common bile duct dilatation. The pancreas is normal except for mild atrophy. The spleen is normal in size. No focal lesions. Adrenal glands and kidneys are unremarkable.  The gastric lap band is well positioned and in normal orientation. No  complicating features. The stomach, duodenum bulb and C-loop are normal. The proximal loops of small bowel are unremarkable. There are thickened loops of small bowel in the right abdomen extending all the way to the distal ileum. The actual terminal ileum is unremarkable. Findings most consistent with a severe enteritis. Crohn's disease is possible. There is associated free abdominal and pelvic fluid but no discrete rim enhancing abscess. No small bowel obstruction. There is mild inflammation of the cecum also. The appendix is normal. No mesenteric or retroperitoneal mass or adenopathy. Small scattered lymph nodes are noted. The aorta is normal in caliber. The major branch vessels are patent.  The uterus and ovaries are unremarkable. Small to moderate amount of free pelvic fluid. No pelvic mass or adenopathy. No inguinal mass or adenopathy.  The bony structures are unremarkable.  IMPRESSION: Significant inflammatory or infectious process involving the small bowel. No findings for functional obstruction, perforation or abscess.  Free abdominal and pelvic fluid due to be above.  No abdominal/pelvic mass or adenopathy.  Gastric lap band is well positioned without complicating features.   Electronically Signed   By: Loralie Champagne M.D.   On: 07/17/2013 12:44    EKG:   Assessment/Plan Principal Problem:   Enteritis: Patient with history of same 2 years ago. Will admit to medical floor. Will keep n.p.o. until GI evaluates. Will provide Cipro and Flagyl intravenously. Will hydrate with IV fluids at 125 miles an hour. Provide pain medicine and anti-emetic as needed. Will check stool for C. difficile. Request GI consult  Active Problems:   Nausea vomiting and diarrhea: Secondary to #1. Will provide anti-nausea medicine as well as check her stool for C. difficile. Patient denies any recent antibiotic use. Keep n.p.o. until GI evaluates.    Abdominal pain: Related to #1. Will provide pain medicine as needed.    Hyperglycemia: Patient unclear if she's taken her insulin today. States her blood sugar has been running high over the last couple of days. Not always compliant with CBG checks during the day and use of sliding scale. Glucose greater than 300 on admission. An ion gap 15. No ketoacidosis. Will provide Levemir at half her home dose as well as sliding scale insulin for optimal glycemic control. Will check a hemoglobin A1c. Keep patient n.p.o. for now do to number 1.    Tachycardia: Mild. Likely related to #1 and mild dehydration secondary to number 4. Patient given 1 L of normal saline in the emergency department. Will continue IV  fluids at 125 mils per hour. Will check vital signs every 4x3  Hyponatremia: Mild. Likely related to decreased by mouth intake and GI losses as well as hyperglycemia. We'll provide IV fluids. Will recheck in the a.m.    Leukocytosis: Likely related to #1. Urinalysis unremarkable. Patient is afebrile and nontoxic appearing on my exam. Lactic acid was 3.1. Patient receiving Cipro and Flagyl. Will recheck in the a.m.   HTN (hypertension): Systolic blood pressure range in the emergency department 170-190. Heart rate 70-95. I medications include Norvasc and lisinopril daily. Will provide Lopressor IV q. 8 with parameters. Will also have when necessary hydralazine available while patient n.p.o.     Hyperlipemia: Will check lipid panel.    Obesity, Class III, BMI 40-49.9 (morbid obesity): Current BMI 37.2. Patient is status post lap band surgery in 2012.    DM (diabetes mellitus): See #4.      ED MD paged Dr Darrick Penna. Requested IP consult on admssion  Code Status: full Family Communication: mother and father at bedside Disposition Plan: home when ready  Time spent: 89 minutes  Gwenyth Bender [facility] Pager [phone]  If 7PM-7AM, please contact night-coverage [email or web address] Password [facility] 07/17/2013, 2:31 PM

## 2013-07-18 ENCOUNTER — Encounter (HOSPITAL_COMMUNITY): Payer: Self-pay | Admitting: Emergency Medicine

## 2013-07-18 ENCOUNTER — Emergency Department (HOSPITAL_COMMUNITY): Payer: BC Managed Care – PPO

## 2013-07-18 ENCOUNTER — Emergency Department (HOSPITAL_COMMUNITY)
Admission: EM | Admit: 2013-07-18 | Discharge: 2013-07-19 | Disposition: A | Discharge status: Home / Self Care | Payer: BC Managed Care – PPO | Attending: Emergency Medicine | Admitting: Emergency Medicine

## 2013-07-18 ENCOUNTER — Telehealth: Payer: Self-pay | Admitting: Gastroenterology

## 2013-07-18 DIAGNOSIS — R109 Unspecified abdominal pain: Secondary | ICD-10-CM

## 2013-07-18 DIAGNOSIS — E785 Hyperlipidemia, unspecified: Secondary | ICD-10-CM | POA: Insufficient documentation

## 2013-07-18 DIAGNOSIS — R1013 Epigastric pain: Secondary | ICD-10-CM | POA: Insufficient documentation

## 2013-07-18 DIAGNOSIS — K5090 Crohn's disease, unspecified, without complications: Secondary | ICD-10-CM | POA: Insufficient documentation

## 2013-07-18 DIAGNOSIS — E876 Hypokalemia: Secondary | ICD-10-CM | POA: Diagnosis not present

## 2013-07-18 DIAGNOSIS — K5289 Other specified noninfective gastroenteritis and colitis: Secondary | ICD-10-CM

## 2013-07-18 DIAGNOSIS — Z79899 Other long term (current) drug therapy: Secondary | ICD-10-CM | POA: Insufficient documentation

## 2013-07-18 DIAGNOSIS — R1012 Left upper quadrant pain: Secondary | ICD-10-CM | POA: Insufficient documentation

## 2013-07-18 DIAGNOSIS — R110 Nausea: Secondary | ICD-10-CM

## 2013-07-18 DIAGNOSIS — Z6841 Body Mass Index (BMI) 40.0 and over, adult: Secondary | ICD-10-CM | POA: Insufficient documentation

## 2013-07-18 DIAGNOSIS — Z792 Long term (current) use of antibiotics: Secondary | ICD-10-CM | POA: Insufficient documentation

## 2013-07-18 DIAGNOSIS — E119 Type 2 diabetes mellitus without complications: Secondary | ICD-10-CM | POA: Insufficient documentation

## 2013-07-18 DIAGNOSIS — Z794 Long term (current) use of insulin: Secondary | ICD-10-CM | POA: Insufficient documentation

## 2013-07-18 DIAGNOSIS — R197 Diarrhea, unspecified: Secondary | ICD-10-CM

## 2013-07-18 LAB — CBC WITH DIFFERENTIAL/PLATELET
Basophils Relative: 0 % (ref 0–1)
Eosinophils Absolute: 0.1 10*3/uL (ref 0.0–0.7)
Eosinophils Relative: 1 % (ref 0–5)
Hemoglobin: 13.1 g/dL (ref 12.0–15.0)
MCH: 28.1 pg (ref 26.0–34.0)
MCHC: 33.1 g/dL (ref 30.0–36.0)
MCV: 85 fL (ref 78.0–100.0)
Monocytes Absolute: 0.4 10*3/uL (ref 0.1–1.0)
Monocytes Relative: 4 % (ref 3–12)
Neutrophils Relative %: 83 % — ABNORMAL HIGH (ref 43–77)
RBC: 4.66 MIL/uL (ref 3.87–5.11)

## 2013-07-18 LAB — BASIC METABOLIC PANEL
BUN: 5 mg/dL — ABNORMAL LOW (ref 6–23)
BUN: 3 mg/dL — ABNORMAL LOW (ref 6–23)
CO2: 23 mEq/L (ref 19–32)
CO2: 20 mEq/L (ref 19–32)
Chloride: 102 mEq/L (ref 96–112)
Creatinine, Ser: 0.55 mg/dL (ref 0.50–1.10)
Creatinine, Ser: 0.57 mg/dL (ref 0.50–1.10)
GFR calc Af Amer: >90 mL/min (ref >90)
GFR calc Af Amer: >90 mL/min (ref >90)
GFR calc non Af Amer: >90 mL/min (ref >90)
Glucose, Bld: 180 mg/dL — ABNORMAL HIGH (ref 70–99)
Glucose, Bld: 175 mg/dL — ABNORMAL HIGH (ref 70–99)
Potassium: 3.3 mEq/L — ABNORMAL LOW (ref 3.5–5.1)

## 2013-07-18 LAB — GLUCOSE, CAPILLARY: Glucose-Capillary: 186 mg/dL — ABNORMAL HIGH (ref 70–99)

## 2013-07-18 LAB — GI PATHOGEN PANEL BY PCR, STOOL
E coli (ETEC) LT/ST: NEGATIVE
E coli (STEC): NEGATIVE
E coli 0157 by PCR: NEGATIVE
G lamblia by PCR: NEGATIVE
Norovirus GI/GII: NEGATIVE
Salmonella by PCR: NEGATIVE
Shigella by PCR: NEGATIVE

## 2013-07-18 LAB — CBC
HCT: 36 % (ref 36.0–46.0)
MCH: 27.7 pg (ref 26.0–34.0)
MCHC: 32.2 g/dL (ref 30.0–36.0)
MCV: 85.9 fL (ref 78.0–100.0)
RDW: 13.1 % (ref 11.5–15.5)

## 2013-07-18 LAB — ANA: Anti Nuclear Antibody(ANA): NEGATIVE

## 2013-07-18 LAB — CLOSTRIDIUM DIFFICILE BY PCR: Toxigenic C. Difficile by PCR: NEGATIVE

## 2013-07-18 LAB — C-REACTIVE PROTEIN: CRP: 1 mg/dL — ABNORMAL HIGH (ref <0.60)

## 2013-07-18 MED ORDER — LIVING WELL WITH DIABETES BOOK
Freq: Once | Status: AC
  Administered 2013-07-18: 13:00:00
  Filled 2013-07-18 (×2): qty 1

## 2013-07-18 MED ORDER — ONDANSETRON HCL 4 MG/2ML IJ SOLN
4.0000 mg | Freq: Once | INTRAMUSCULAR | Status: AC
  Administered 2013-07-18: 4 mg via INTRAVENOUS
  Filled 2013-07-18: qty 2

## 2013-07-18 MED ORDER — MESALAMINE ER 250 MG PO CPCR: 1000.0000 mg | ORAL_CAPSULE | Freq: Three times a day (TID) | ORAL | Status: DC

## 2013-07-18 MED ORDER — SODIUM CHLORIDE 0.9 % IV SOLN: 1000.0000 mL | INTRAVENOUS | Status: DC

## 2013-07-18 MED ORDER — POTASSIUM CHLORIDE CRYS ER 20 MEQ PO TBCR
40.0000 meq | EXTENDED_RELEASE_TABLET | ORAL | Status: AC
  Administered 2013-07-18 (×2): 40 meq via ORAL
  Filled 2013-07-18 (×2): qty 2

## 2013-07-18 MED ORDER — METRONIDAZOLE 500 MG PO TABS: 500.0000 mg | ORAL_TABLET | Freq: Three times a day (TID) | ORAL | Status: DC

## 2013-07-18 MED ORDER — MESALAMINE ER 250 MG PO CPCR
1000.0000 mg | ORAL_CAPSULE | Freq: Three times a day (TID) | ORAL | Status: DC
Start: 2013-07-18 — End: 2013-07-18
  Administered 2013-07-18: 1000 mg via ORAL
  Filled 2013-07-18 (×3): qty 4

## 2013-07-18 MED ORDER — CIPROFLOXACIN HCL 250 MG PO TABS
500.0000 mg | ORAL_TABLET | Freq: Two times a day (BID) | ORAL | Status: DC
  Administered 2013-07-18: 500 mg via ORAL
  Filled 2013-07-18: qty 2

## 2013-07-18 MED ORDER — SODIUM CHLORIDE 0.9 % IV SOLN
1000.0000 mL | Freq: Once | INTRAVENOUS | Status: AC
  Administered 2013-07-18: 1000 mL via INTRAVENOUS

## 2013-07-18 MED ORDER — HYDROMORPHONE HCL PF 1 MG/ML IJ SOLN
1.0000 mg | Freq: Once | INTRAMUSCULAR | Status: AC
  Administered 2013-07-18: 1 mg via INTRAVENOUS
  Filled 2013-07-18: qty 1

## 2013-07-18 MED ORDER — LIVING WELL WITH DIABETES BOOK: 1.0000 | Freq: Once | Status: DC

## 2013-07-18 MED ORDER — CIPROFLOXACIN HCL 500 MG PO TABS: 500.0000 mg | ORAL_TABLET | Freq: Two times a day (BID) | ORAL | Status: AC

## 2013-07-18 MED ORDER — METOCLOPRAMIDE HCL 5 MG/ML IJ SOLN
10.0000 mg | Freq: Once | INTRAMUSCULAR | Status: AC
  Administered 2013-07-18: 10 mg via INTRAVENOUS
  Filled 2013-07-18: qty 2

## 2013-07-18 MED ORDER — DIPHENHYDRAMINE HCL 50 MG/ML IJ SOLN
25.0000 mg | Freq: Once | INTRAMUSCULAR | Status: AC
  Administered 2013-07-18: 25 mg via INTRAVENOUS
  Filled 2013-07-18: qty 1

## 2013-07-18 MED ORDER — METRONIDAZOLE 500 MG PO TABS
500.0000 mg | ORAL_TABLET | Freq: Three times a day (TID) | ORAL | Status: DC
  Administered 2013-07-18: 500 mg via ORAL
  Filled 2013-07-18: qty 1

## 2013-07-18 NOTE — Progress Notes (Signed)
Patient seen, independently examined and chart reviewed. I agree with exam, assessment and plan discussed with Toya Smothers, NP.  She feels much better today. She has no abdominal pain. She would like to advance her diet.  Afebrile, vital signs stable. She appears calm and comfortable. Exam is benign.  Continue empiric treatment for enteritis, empiric antibiotics, further recommendations per GI, advance to full liquids diet for now.  Could potentially go home later today versus the morning depending on GI recommendations.  Brendia Sacks, MD [facility] [phone]

## 2013-07-18 NOTE — Telephone Encounter (Signed)
SF next available will be 12/4. Will that be OK?

## 2013-07-18 NOTE — Progress Notes (Addendum)
Subjective:  Patient feels much better. BM 5-6 watery in the last 24 hours. No vomiting. Abdominal pain less. Wants to eat.   Objective: Vital signs in last 24 hours: Temp:  [98 F (36.7 C)-98.4 F (36.9 C)] 98 F (36.7 C) (10/30 0600) Pulse Rate:  [66-73] 73 (10/30 0921) Resp:  [20] 20 (10/30 0600) BP: (110-178)/(57-101) 152/82 mmHg (10/30 0921) SpO2:  [98 %-100 %] 98 % (10/30 0921) Last BM Date: 07/18/13 General:   Alert,  Well-developed, well-nourished, pleasant and cooperative in NAD Head:  Normocephalic and atraumatic. Eyes:  Sclera clear, no icterus.  Abdomen:  Soft, nontender and nondistended.  Normal bowel sounds, without guarding, and without rebound.   Extremities:  Without clubbing, deformity or edema. Neurologic:  Alert and  oriented x4;  grossly normal neurologically. Skin:  Intact without significant lesions or rashes. Psych:  Alert and cooperative. Normal mood and affect.  Intake/Output from previous day: 10/29 0701 - 10/30 0700 In: 2283.3 [I.V.:1583.3; IV Piggyback:700] Out: 152 [Urine:151; Stool:1] Intake/Output this shift:    Lab Results: CBC  Recent Labs  07/17/13 0701 07/18/13 0554  WBC 13.9* 8.9  HGB 14.2 11.6*  HCT 42.6 36.0  MCV 85.0 85.9  PLT 259 218   BMET  Recent Labs  07/17/13 0701 07/18/13 0554  NA 134* 135  K 3.7 3.0*  CL 98 102  CO2 21 23  GLUCOSE 341* 180*  BUN 8 5*  CREATININE 0.67 0.55  CALCIUM 9.0 7.8*   LFTs  Recent Labs  07/17/13 0701  BILITOT 0.4  ALKPHOS 74  AST 12  ALT 8  PROT 7.1  ALBUMIN 3.1*    Recent Labs  07/17/13 0701  LIPASE 24   PT/INR No results found for this basename: LABPROT, INR,  in the last 72 hours    C. difficile PCR negative Sedimentation rate 10. ANA negative.  Imaging Studies: Ct Abdomen Pelvis W Contrast  07/17/2013   CLINICAL DATA:  Abdominal pain. History of gastric lap band.  EXAM: CT ABDOMEN AND PELVIS WITH CONTRAST  TECHNIQUE: Multidetector CT imaging of the abdomen  and pelvis was performed using the standard protocol following bolus administration of intravenous contrast.  CONTRAST:  OMNIPAQUE IOHEXOL 300 MG/ML  SOLN  COMPARISON:  02/07/2011.  FINDINGS: The lung bases are clear.  There is diffuse fatty infiltration of the liver but no focal hepatic lesions or intrahepatic biliary dilatation. The gallbladder is surgically absent. No common bile duct dilatation. The pancreas is normal except for mild atrophy. The spleen is normal in size. No focal lesions. Adrenal glands and kidneys are unremarkable.  The gastric lap band is well positioned and in normal orientation. No complicating features. The stomach, duodenum bulb and C-loop are normal. The proximal loops of small bowel are unremarkable. There are thickened loops of small bowel in the right abdomen extending all the way to the distal ileum. The actual terminal ileum is unremarkable. Findings most consistent with a severe enteritis. Crohn's disease is possible. There is associated free abdominal and pelvic fluid but no discrete rim enhancing abscess. No small bowel obstruction. There is mild inflammation of the cecum also. The appendix is normal. No mesenteric or retroperitoneal mass or adenopathy. Small scattered lymph nodes are noted. The aorta is normal in caliber. The major branch vessels are patent.  The uterus and ovaries are unremarkable. Small to moderate amount of free pelvic fluid. No pelvic mass or adenopathy. No inguinal mass or adenopathy.  The bony structures are unremarkable.  IMPRESSION:  Significant inflammatory or infectious process involving the small bowel. No findings for functional obstruction, perforation or abscess.  Free abdominal and pelvic fluid due to be above.  No abdominal/pelvic mass or adenopathy.  Gastric lap band is well positioned without complicating features.   Electronically Signed   By: Loralie Champagne M.D.   On: 07/17/2013 12:44  [2 weeks]   Assessment: Acute onset abdominal  pain, nausea and vomiting, diarrhea with chronic thickening to the distal small bowel as noted on CT. Previous workup well outlined in Dr. Patty Sermons note from yesterday. Suspect we are dealing with small bowel Crohn's disease although the diagnosis has not been well established given negative biopsies previously and negative IBD panel. She did very well on Pentasa before but stopped after 10 months due to medication expense.  Patient is feeling better today. She continues to have diarrhea but abdominal pain has improved. She is hungry and wants to eat.  Plan: 1. Complete ten-day course of Cipro and Flagyl. 2. Would consider resuming Pentasa or other maintenance medication. 3. Followup pending labs in GI pathogen panel. 4. Full liquid diet. May possibly be able to be discharged later today or tomorrow with close GI follow-up.   LOS: 1 day   Tana Coast  07/18/2013, 12:01 PM   Attending note:  Patient appears to be much better over that seen at the time of admission. I suspect Crohn's disease. A recent superimposed enteric infection not excluded at this time. I agree with above recommendations including resuming Pentasa. Would consider a repeat ileoscopy at some point to obtain additional biopsies to corroborate inflammatory bowel disease. Anticipate discharge soon.

## 2013-07-18 NOTE — ED Provider Notes (Signed)
CSN: [id number]     Arrival date & time 07/18/13  2147 History  This chart was scribed for Dione Booze, MD by Dorothey Baseman, ED Scribe. This patient was seen in room APA04/APA04 and the patient's care was started at 11:04 PM.    No chief complaint on file.  The history is provided by the patient. No language interpreter was used.   HPI Comments: [name] [name] is a 46 y.o. Female with a history of Chron's disease who presents to the Emergency Department complaining of some Chron's exacerbation. Patient was admitted here for similar complaints yesterday and discharged around 4:00 PM today, around 7 hours ago. Patient reports a sharp, cramping pain to the epigastric and LUQ of the abdomen that has been progressively worsening since this afternoon, 9/10 currently, that is mildly and temporarily relieved when standing up, but exacerbated with lying down. She reports associated nausea, chills, and watery diarrhea onset 1-2 days ago. Patient denies emesis, but states that she has been dry heaving, and diaphoresis. Patient reports a history of cholecystectomy. Patient reports a history of diverticulosis, HTN, and DM.   Past Medical History  Diagnosis Date  . 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 DM (diabetes mellitus)   . HTN (hypertension)   . Hyperlipemia   . Vitamin D deficiency   . Diverticulosis OCT 2011     PAN-COLONIC  . Obesity, Class III, BMI 40-49.9 (morbid obesity) AUG 2010 305 LBS    MAY 2012 267   Past Surgical History  Procedure Laterality Date  . Upper gastrointestinal endoscopy  MAY 2012 GIVENS CAPSULE PLACEMENT    GASTRITIS  . Laparoscopic gastric banding  AUG 2010 BMI 50    GASTRIC POUCH 2-3 CM  . Umbilical hernia repair  AUG 2010 MATTHEW MARTIN    INCARCERATED OMENTUM  . Cholecystectomy    . Abdominal exploration surgery  May 05, 2009 MM    INFLAMMED TI (SEROSITIS), "DIDN'T LOOK LIKE CROHN'S"  . Colonoscopy  DEC 2010 WO  . Colonoscopy  OCT  2011    MILD ERYTHEMA IN TI (30-40 CM), NL SB/COLON Bx   Family History  Problem Relation Age of Onset  . Colon cancer Neg Hx   . Colon polyps Neg Hx    History  Substance Use Topics  . Smoking status: Never Smoker   . Smokeless tobacco: Not on file  . Alcohol Use: No   OB History   Grav Para Term Preterm Abortions TAB SAB Ect Mult Living                 Review of Systems  A complete 10 system review of systems was obtained and all systems are negative except as noted in the HPI and PMH.   Allergies  Review of patient's allergies indicates no known allergies.  Home Medications   Current Outpatient Rx  Name  Route  Sig  Dispense  Refill  . amitriptyline (ELAVIL) 10 MG tablet   Oral   Take 1 tablet by mouth daily.         Marland Kitchen amLODipine (NORVASC) 5 MG tablet   Oral   Take 5 mg by mouth daily.         . ciprofloxacin (CIPRO) 500 MG tablet   Oral   Take 1 tablet (500 mg total) by mouth 2 (two) times daily.   18 tablet   0   . insulin aspart (NOVOLOG) 100 UNIT/ML injection  Subcutaneous   Inject 2-10 Units into the skin See admin instructions. Sliding Scale: 0-200 = 2 units, 201-300 = 4 units, 301-350 = 6 units, 351-400 = 8 units, >401=10 units.         . insulin detemir (LEVEMIR) 100 UNIT/ML injection   Subcutaneous   Inject 60 Units into the skin daily after breakfast.           . lisinopril (PRINIVIL,ZESTRIL) 40 MG tablet   Oral   Take 40 mg by mouth daily.          Marland Kitchen living well with diabetes book MISC   Does not apply   1 each by Does not apply route once.   1 each   0   . mesalamine (PENTASA) 250 MG CR capsule   Oral   Take 4 capsules (1,000 mg total) by mouth 3 (three) times daily.   90 capsule   0   . EXPIRED: mesalamine (PENTASA) 500 MG CR capsule   Oral   Take 2 capsules (1,000 mg total) by mouth 3 (three) times daily.   180 capsule   11   . metroNIDAZOLE (FLAGYL) 500 MG tablet   Oral   Take 1 tablet (500 mg total) by mouth  every 8 (eight) hours.   27 tablet   0   . norgestimate-ethinyl estradiol (SPRINTEC 28) 0.25-35 MG-MCG tablet   Oral   Take 1 tablet by mouth daily.          Triage Vitals: BP 204/85  Pulse 94  Temp(Src) 97.8 F (36.6 C) (Oral)  Resp 22  Ht 5\' 6"  (1.676 m)  Wt 230 lb (104.327 kg)  BMI 37.14 kg/m2  SpO2 100%  LMP 06/16/2013  Physical Exam  Nursing note and vitals reviewed. Constitutional: She is oriented to person, place, and time. She appears well-developed and well-nourished.  HENT:  Head: Normocephalic and atraumatic.  Eyes: Conjunctivae and EOM are normal. Pupils are equal, round, and reactive to light.  Neck: Normal range of motion. Neck supple.  Abdominal: Soft. Bowel sounds are normal. She exhibits no distension. There is tenderness. There is no rebound and no guarding.  Moderate tenderness to palpation to the epigastric region and LUQ.   Musculoskeletal: Normal range of motion.  Neurological: She is alert and oriented to person, place, and time.  Skin: Skin is warm and dry.  Psychiatric: She has a normal mood and affect. Her behavior is normal.    ED Course  Procedures (including critical care time)  DIAGNOSTIC STUDIES: Oxygen Saturation is 100% on room air, normal by my interpretation.    COORDINATION OF CARE: 11:08 PM- Will order an additional x-ray of the abdomen, anti-nausea and pain medication, and IV fluids. Discussed treatment plan with patient at bedside and patient verbalized agreement.     Labs Review Results for orders placed during the hospital encounter of 07/18/13  CBC WITH DIFFERENTIAL      Result Value Range   WBC 10.3  4.0 - 10.5 K/uL   RBC 4.66  3.87 - 5.11 MIL/uL   Hemoglobin 13.1  12.0 - 15.0 g/dL   HCT 16.1  09.6 - 04.5 %   MCV 85.0  78.0 - 100.0 fL   MCH 28.1  26.0 - 34.0 pg   MCHC 33.1  30.0 - 36.0 g/dL   RDW 40.9  81.1 - 91.4 %   Platelets 218  150 - 400 K/uL   Neutrophils Relative % 83 (*) 43 - 77 %  Neutro Abs 8.6 (*) 1.7  - 7.7 K/uL   Lymphocytes Relative 12  12 - 46 %   Lymphs Abs 1.2  0.7 - 4.0 K/uL   Monocytes Relative 4  3 - 12 %   Monocytes Absolute 0.4  0.1 - 1.0 K/uL   Eosinophils Relative 1  0 - 5 %   Eosinophils Absolute 0.1  0.0 - 0.7 K/uL   Basophils Relative 0  0 - 1 %   Basophils Absolute 0.0  0.0 - 0.1 K/uL  BASIC METABOLIC PANEL      Result Value Range   Sodium 136  135 - 145 mEq/L   Potassium 3.3 (*) 3.5 - 5.1 mEq/L   Chloride 100  96 - 112 mEq/L   CO2 20  19 - 32 mEq/L   Glucose, Bld 175 (*) 70 - 99 mg/dL   BUN 3 (*) 6 - 23 mg/dL   Creatinine, Ser 1.61  0.50 - 1.10 mg/dL   Calcium 9.1  8.4 - 09.6 mg/dL   GFR calc non Af Amer >90  >90 mL/min   GFR calc Af Amer >90  >90 mL/min  LACTIC ACID, PLASMA      Result Value Range   Lactic Acid, Venous 1.2  0.5 - 2.2 mmol/L   Imaging Review Dg Abd Acute W/chest  07/18/2013   CLINICAL DATA:  Abdominal pain  EXAM: ACUTE ABDOMEN SERIES (ABDOMEN 2 VIEW & CHEST 1 VIEW)  COMPARISON:  CT 07/17/2013 and earlier studies  FINDINGS: Lungs clear. Heart size normal. No effusion.  No free air. Gastric lap band projects in expected location. A few gas distended mid abdominal small bowel loops with fluid levels. Colon is nondilated. Bilateral pelvic phleboliths. Regional bones unremarkable. Marland Kitchen  IMPRESSION: 1. Lap band in expected location. 2. A few gas distended small bowel loops, nonspecific, may represent early/ partial obstruction or early ileus. If symptoms persists, followup films or CT may be useful. 3. No free air. 4. No acute cardiopulmonary disease.   Electronically Signed   By: Oley Balm M.D.   On: 07/18/2013 23:56     MDM   1. Abdominal pain   2. Nausea   3. Crohn's disease    Abdominal pain and nausea in patient with Crohn's disease. Old records are reviewed and she was admitted overnight for abdominal pain, nausea. CT showed a biliary this and she is discharged on ciprofloxacin and metronidazole. She tended to document a prescription for  anything for pain or nausea. She will be given IV hydration, IV hydromorphone, and IV ondansetron and reassessed.  Laboratory workup is unremarkable other than mild hypokalemia. She got good relief of pain and nausea with the above-noted treatment. She'll be given oral fluid challenge.  She has successfully taken oral fluids and not had any recurrence of nausea. Pain is starting to recur it. She was observed in the ED a full 4 hours following the dose of hydromorphone and pain has not gotten any worse. She is discharged with prescriptions for oxycodone and acetaminophen as well as metoclopramide. Patient was advised of the theoretical risk of developing toxic megacolon from narcotic use. She has an appointment with her gastroenterologist in 5 weeks. She is to try and move that appointment sooner. She is to return to the ED if pain and nausea are not being adequately controlled at home.  I personally performed the services described in this documentation, which was scribed in my presence. The recorded information has been reviewed and is accurate.  Dione Booze, MD 07/19/13 [phone]

## 2013-07-18 NOTE — Progress Notes (Signed)
AVS reviewed with patient.  Patient verbalized understanding of discharge instructions and physician follow-up.  Work note provided to patient.  Pt's IV removed.  Site WNL.  Living Well with Diabetes provided to patient by Diabetes Coordinator.  Pt's IV removed.  Site WNL.  Pt reports all belongings intact and in possession at time of discharge.  Pt stable at time of discharge.  Pt transported by NT via w/c to main entrance for discharge.

## 2013-07-18 NOTE — Discharge Summary (Signed)
Physician Discharge Summary  [name] [name] ZOX:[id number] DOB: Oct 22, 1965 DOA: 07/17/2013  PCP: Kirstie Peri, MD  Admit date: 07/17/2013 Discharge date: 07/18/2013  Time spent: 40 minutes  Recommendations for Outpatient Follow-up:  1. PCP 1 week for monitoring of CBG's and evaluation of symptoms. Recommend bmet to track potassium level 2. Dr. Darrick Penna in 3 weeks. Follow GI pathogen and C reactive protien  Discharge Diagnoses:  Principal Problem:   Enteritis Active Problems:   Nausea vomiting and diarrhea   Abdominal pain   DM (diabetes mellitus)   HTN (hypertension)   Hyperlipemia   Obesity, Class III, BMI 40-49.9 (morbid obesity)   Hyponatremia   Leukocytosis   Hyperglycemia   Tachycardia   Hypokalemia   Discharge Condition: stable at discharge  Diet recommendation: full liquid. Advance as tolerated  Filed Weights   07/17/13 0653  Weight: 104.327 kg (230 lb)    History of present illness:  [name] [name] is a very pleasant 47 y.o. female with a past medical history that includes diabetes, hypertension, hyperlipidemia, remote gastric band placement, ileitis, presented to the emergency room 07/17/13 with the chief complaint of persistent abdominal pain/nausea/vomiting/diarrhea. She indicated that 3 days prior she developed abdominal pain. She reported the pain located mid abdomen left and right. She described the pain as constant with intermittent variation of intensity, sharp and cramp-like. Its worst she rated the pain a 10 out of 10 and described this was usually when the nausea and vomiting occured. In addition she developed diarrhea. She described her stool as watery and slightly yellow. She denied any recent antibiotic use. She indicated that she had been having 3-4 episodes of emesis for the last 3 days and 7 episodes of diarrhea during the same timeframe. She indicated that standing up sometimes gave her relief from the abdominal pain but other than that nothing made the  pain better or worse. Associated symptoms include anorexia. She denied any dysuria hematuria frequency or urgency. She denied any melena or bright red blood per rectum. She denied fever or chills headache. She indicated that she usually checks her blood sugar 2-3 times a day but frequently does not have time while at work. Over the last several days her blood sugar had been greater than 300 in spite of the fact that she'd been unable to eat. Initial evaluation in the emergency department significant for white count of 13.9, sodium of 134 and glucose of 341 and lactic acid of 3.1 and lipase within the limits of normal. Urinalysis with  1000 glucose 40 ketones few bacteria and few squama cell. CT of the abdomen yielded significant inflammatory or infectious process involving the small bowel. Findings for functional obstruction, perforation or abscess. Small amount of free abdominal and pelvic fluid. Gastric lap band is well-positioned without complicating features. Vital signs significant for a blood pressure of 178/88 and a heart rate of 110. In the emergency department patient was given 1 L of normal saline, 400 mg of Cipro and 500 mg of Flagyl.   Hospital course:  Enteritis: Patient with history of same 2 years ago. Admitted to medical floor. Started on IV flagyl and cipro and provided clear liquids. Much improved the day of discharge and tolerating full liquid diet.  Evaluated by GI who opined small bowel Crohn's disease although diagnosis has not been well established given negative biopsies previously and negative IBD panel. C-reactive protein, GI pathogen and protein electrophor pending at discharge. Sed rate 10 and ANA negative. Continue Cipro and Flagyl at discharge  to complete 10 day course. GI recommended Pentasa as she did well in past . Will start 1000mg  TID per GI. Follow up with Dr. Darrick Penna 2-3 weeks  Active Problems:  Nausea vomiting and diarrhea: Secondary to #1. Resolved day of discharge. C.  Difficile negative. Tolerating full  liquids at discharge. Recommend slowly advancing diet as tolerated.   Abdominal pain: Related to #1. Resolved at discharge. See #1. Similar episodes in past. Previous GI workup yielded ulcers involving terminal ileum. Was on Pentasa previously but stopped due to expenses. Pentasa resumed per GI. Follow up with Dr. Darrick Penna 2-3 weeks   Hyperglycemia: Not completely compliant at home. Glucose greater than 300 on admission. An ion gap 10 at discharge. No ketoacidosis. CBG range 186-180. HgA1c 10.1 indicating poor control. Continue Levemir at discharge. Recommend close OP follow up for optimal glycemic control.  Tachycardia: resolved at discharge. Likely related to #1 and mild dehydration secondary to number 4. Patient given 1 L of normal saline in the emergency department. Tolerating full liquids without problem.   Hyponatremia: Resolved at discharge.  Likely related to decreased by mouth intake and GI losses as well as hyperglycemia.    Hypokalemia: likely related to GI loses and decreased po intake. Will replete and recheck.   Leukocytosis: Likely related to #1. Resolved at discharge. Urinalysis unremarkable. Patient remains afebrile and nontoxic appearing. Continue Cipro and Flagyl as noted above.    HTN (hypertension): controlled.    Hyperlipemia: Triglyceride 222 and VLDL 44. OP follow up.    Obesity, Class III, BMI 40-49.9 (morbid obesity): Current BMI 37.2. Patient is status post lap band surgery in 2012.   DM (diabetes mellitus): See #4.     Procedures:  none  Consultations:  GI  Discharge Exam: Filed Vitals:   07/18/13 0921  BP: 152/82  Pulse: 73  Temp:   Resp:     General: obese NAD Cardiovascular: RRR No MGR No LE edema Respiratory: normal effort BS clear bilaterally no wheeze Abdomen: soft +BS non-tender to palpation  Discharge Instructions   Future Appointments Provider Department Dept Phone   08/22/2013 11:00 AM [place], MD [facility] [phone]       Medication List         amitriptyline 10 MG tablet  Commonly known as:  ELAVIL  Take 1 tablet by mouth daily.     amLODipine 5 MG tablet  Commonly known as:  NORVASC  Take 5 mg by mouth daily.     ciprofloxacin 500 MG tablet  Commonly known as:  CIPRO  Take 1 tablet (500 mg total) by mouth 2 (two) times daily.     insulin aspart 100 UNIT/ML injection  Commonly known as:  novoLOG  Inject 2-10 Units into the skin See admin instructions. Sliding Scale: 0-200 = 2 units, 201-300 = 4 units, 301-350 = 6 units, 351-400 = 8 units, >401=10 units.     insulin detemir 100 UNIT/ML injection  Commonly known as:  LEVEMIR  Inject 60 Units into the skin daily after breakfast.     lisinopril 40 MG tablet  Commonly known as:  PRINIVIL,ZESTRIL  Take 40 mg by mouth daily.     living well with diabetes book Misc  1 each by Does not apply route once.     mesalamine 250 MG CR capsule  Commonly known as:  PENTASA  Take 4 capsules (1,000 mg total) by mouth 3 (three) times daily.     metroNIDAZOLE 500 MG tablet  Commonly known as:  FLAGYL  Take 1 tablet (500 mg total) by mouth every 8 (eight) hours.     SPRINTEC 28 0.25-35 MG-MCG tablet  Generic drug:  norgestimate-ethinyl estradiol  Take 1 tablet by mouth daily.       No Known Allergies     Follow-up Information   Follow up with [facility], MD. Schedule an appointment as soon as possible for a visit in 1 week. (follow CBG's)    Specialty:  Internal Medicine   Contact information:   [address]  [place] [address] [phone]       Follow up with Jonette Eva, MD. Schedule an appointment as soon as possible for a visit in 3 weeks. (call for follow up appointment. If symptoms do not improve or worsen call GI office. )    Specialty:  Gastroenterology   Contact information:   [address] [place] [address] [phone]         The results of significant diagnostics from this hospitalization (including imaging, microbiology, ancillary and laboratory) are listed below for reference.    Significant Diagnostic Studies: Ct Abdomen Pelvis W Contrast  07/17/2013   CLINICAL DATA:  Abdominal pain. History of gastric lap band.  EXAM: CT ABDOMEN AND PELVIS WITH CONTRAST  TECHNIQUE: Multidetector CT imaging of the abdomen and pelvis was performed using the standard protocol following bolus administration of intravenous contrast.  CONTRAST:  OMNIPAQUE IOHEXOL 300 MG/ML  SOLN  COMPARISON:  02/07/2011.  FINDINGS: The lung bases are clear.  There is diffuse fatty infiltration of the liver but no focal hepatic lesions or intrahepatic biliary dilatation. The gallbladder is surgically absent. No common bile duct dilatation. The pancreas is normal except for mild atrophy. The spleen is normal in size. No focal lesions. Adrenal glands and kidneys are unremarkable.  The gastric lap band is well positioned and in normal orientation. No complicating features. The stomach, duodenum bulb and C-loop are normal. The proximal loops of small bowel are unremarkable. There are thickened loops of small bowel in the right abdomen extending all the way to the distal ileum. The actual terminal ileum is unremarkable. Findings most consistent with a severe enteritis. Crohn's disease is possible. There is associated free abdominal and pelvic fluid but no discrete rim enhancing abscess. No small bowel obstruction. There is mild inflammation of the cecum also. The appendix is normal. No mesenteric or retroperitoneal mass or adenopathy. Small scattered lymph nodes are noted. The aorta is normal in caliber. The major branch vessels are patent.  The uterus and ovaries are unremarkable. Small to moderate amount of free pelvic fluid. No pelvic mass or adenopathy. No inguinal mass or adenopathy.  The bony structures are unremarkable.  IMPRESSION: Significant  inflammatory or infectious process involving the small bowel. No findings for functional obstruction, perforation or abscess.  Free abdominal and pelvic fluid due to be above.  No abdominal/pelvic mass or adenopathy.  Gastric lap band is well positioned without complicating features.   Electronically Signed   By: Loralie Champagne M.D.   On: 07/17/2013 12:44    Microbiology: Recent Results (from the past 240 hour(s))  CLOSTRIDIUM DIFFICILE BY PCR     Status: None   Collection Time    07/17/13  9:30 PM      Result Value Range Status   C difficile by pcr NEGATIVE  NEGATIVE Final     Labs: Basic Metabolic Panel:  Recent Labs Lab 07/17/13 0701 07/18/13  0554  NA 134* 135  K 3.7 3.0*  CL 98 102  CO2 21 23  GLUCOSE 341* 180*  BUN 8 5*  CREATININE 0.67 0.55  CALCIUM 9.0 7.8*   Liver Function Tests:  Recent Labs Lab 07/17/13 0701  AST 12  ALT 8  ALKPHOS 74  BILITOT 0.4  PROT 7.1  ALBUMIN 3.1*    Recent Labs Lab 07/17/13 0701  LIPASE 24   No results found for this basename: AMMONIA,  in the last 168 hours CBC:  Recent Labs Lab 07/17/13 0701 07/18/13 0554  WBC 13.9* 8.9  NEUTROABS 11.6*  --   HGB 14.2 11.6*  HCT 42.6 36.0  MCV 85.0 85.9  PLT 259 218   Cardiac Enzymes: No results found for this basename: CKTOTAL, CKMB, CKMBINDEX, TROPONINI,  in the last 168 hours BNP: BNP (last 3 results) No results found for this basename: PROBNP,  in the last 8760 hours CBG:  Recent Labs Lab 07/17/13 0722 07/17/13 1706 07/17/13 2120 07/18/13 0749 07/18/13 1159  GLUCAP 319* 209* 180* 186* 190*       Signed:  Kennadi Albany M  [facility] 07/18/2013, 3:21 PM

## 2013-07-18 NOTE — Progress Notes (Signed)
Utilization Review Complete  

## 2013-07-18 NOTE — Telephone Encounter (Signed)
E30 hospital follow-up with Dr. Darrick Penna in 2-3 weeks.

## 2013-07-18 NOTE — Telephone Encounter (Signed)
That will be okay. If she has problems and needs to be sooner, then put her on my schedule.

## 2013-07-18 NOTE — Progress Notes (Signed)
Inpatient Diabetes Program Recommendations  AACE/ADA: New Consensus Statement on Inpatient Glycemic Control (2013)  Target Ranges:  Prepandial:   less than 140 mg/dL      Peak postprandial:   less than 180 mg/dL (1-2 hours)      Critically ill patients:  140 - 180 mg/dL   Results for [name] [name], [name] [name] (MRN [id number]) as of 07/18/2013 14:45  Ref. Range 07/17/2013 07:22 07/17/2013 17:06 07/17/2013 21:20 07/18/2013 07:49 07/18/2013 11:59  Glucose-Capillary Latest Range: 70-99 mg/dL 409 (H) 811 (H) 914 (H) 186 (H) 190 (H)    Inpatient Diabetes Program Recommendations Insulin - Basal: Please consider increasing Levemir to 32 units QAM. Correction (SSI): Please consider increasing Novolog correction to resistant scale.  Note: Spoke with pt about diabetes and home regimen for diabetes control. Patient reports that she takes Levemir 60 units QAM and Novolog 2-10 units BID (in am and at bedtime) as an outpatient for glycemic control.  Patient reports that she sees Dr. Sherryll Burger as her PCP and he is specialized in Diabetes Management.  Discussed A1C results (10.1% on 07/17/2013) and explained what an A1C is, basic pathophysiology of DM Type 2, basic home care, importance of checking CBGs and maintaining good CBG control to prevent long-term and short-term complications.  Patient reports that she see Dr. Sherryll Burger every 3 months and her last A1C in July was 8.3%.  Patient reports that she use to check her blood sugar 3 times a day but has decreased to 2 times a day due to cost of strips.  Patient has insurance but she has to pay a percentage of testing supplies and she pays $75 per vial for each insulin per month (a total of $150 per month for Levemir and Novolog).  Expressed concern regarding A1C of 10.1% and stressed the potential complications that can develop with uncontrolled diabetes.  Provided patient with Living Well with Diabetes booklet and reviewed it with her.  In discussing nutrition, patient reports that she  eats whatever she wants in very small portions and she usually drinks sugar free liquids.  Discussed carbohydrate diet and how carbohydrates impact blood glucose.  Patient reports that she had lap band surgery in 2010 and eats several small meals throughout the day.  Asked patient to pay attention to the amount of carbohydrates she eats per day and to stop drinking any drinks with sugar.  When asked about hypoglycemia patient reports that she is rarely low and does not recall having a low "in quite a while".  She is able to verbalize treatment for hypoglycemia.  Discussed impact of stress, sickness, and exercise on blood glucose control.  Patient reports that she does not have time to exercise with the way she works but she does state that she knows she should exercise because it helps her blood sugar.  Encouraged patient to try to incorporate some type of exercise in her daily regimen (such as parking further away from buildings, walking during breaks or lunch, and walking around house or neighborhood).    Asked that patient check her blood sugar at least 3 times a day and to keep a log to take with her to her follow up visits with Dr. Sherryll Burger so adjustments can be made in her medications for glycemic control.  Patient reports that she can do that and that she will check her blood glucose at least 3 times a day and keep a log as asked to do.  Asked patient to also consider food choices and carbohydrates when  deciding what to eat.  Patient verbalized understanding of information discussed and states that she has no further questions at this time related to diabetes.  Will continue to follow as an inpatient.  Thanks, Orlando Penner, RN, MSN, CCRN Diabetes Coordinator Inpatient Diabetes Program [phone] (Team Pager) [phone] ([facility] office) [phone] [facility] ([place]) office)

## 2013-07-18 NOTE — Discharge Summary (Signed)
Patient seen, independently examined and chart reviewed. I agree with exam, assessment and plan discussed with Toya Smothers, NP.  I agree with discharge home.  Brendia Sacks, MD [facility] [phone]

## 2013-07-18 NOTE — Progress Notes (Signed)
[facility] PROGRESS NOTE  [name] [name] ZOX:[id number] DOB: 1966/09/11 DOA: 07/17/2013 PCP: Kirstie Peri, MD  Assessment/Plan: Enteritis: Patient with history of same 2 years ago. Much improved this am. denies pain/nausea. Tolerating clear liquids without problem. Requesting food. Appreciate GI assistance. c-reactive protein, GI pathogen and protein electrophor peniding. Sed rate 10 and ANA negative. Continue Cipro and Flagyl day #2 but transition to po. Will hydrate with IV fluids at slower rate. Provide pain medicine and anti-emetic as needed. Will advance diet to full liquid per GI  Active Problems:  Nausea vomiting and diarrhea: Secondary to #1. Resolved. Continue anti-nausea medicine as needed. C. Difficile negative. Patient denies any recent antibiotic use. Tolerating clear liquids.   Abdominal pain: Related to #1. Resolved. Concern for Chron's. Similar episodes in past. Previous GI workup yielded ulcers involving terminal ileum. Was on Pentasa previously but stopped due to expenses. GI following. Will provide pain medicine as needed.   Hyperglycemia: Not completely compliant at home. Glucose greater than 300 on admission. An ion gap 10. No ketoacidosis. CBG range 186-180. HgA1c 10.1 indicating poor control. Continue Levemir at half her home dose for now. If diet advanced will consider adding meal coverage as well as sliding scale insulin for optimal glycemic control. Will request diabetic consult as well to educate and encourage compliance.    Tachycardia: resolved.  Likely related to #1 and mild dehydration secondary to number 4. Patient given 1 L of normal saline in the emergency department. Will decrease IV fluids 98ml/ hour. Tolerating clear liquids without problem.    Hyponatremia: Resolved. Likely related to decreased by mouth intake and GI losses as well as hyperglycemia. Continue IV fluids at lower rate. Will recheck in the a.m.   Hypokalemia: likely related to GI loses and  decreased po intake. Will replete and recheck.  Leukocytosis: Likely related to #1. Resolved this am. Urinalysis unremarkable. Patient remains afebrile and nontoxic appearing. Continue Cipro and Flagyl day #2. Will recheck in the a.m.   HTN (hypertension): controlled. Home medications resumed once patient given clear liquids   Hyperlipemia: Triglyceride 222 and VLDL 44.   Obesity, Class III, BMI 40-49.9 (morbid obesity): Current BMI 37.2. Patient is status post lap band surgery in 2012.   DM (diabetes mellitus): See #4.   Code Status: full Family Communication: family at bedside Disposition Plan: home hopefully 24 hours pending GI plan   Consultants:  GI  Procedures:  none  Antibiotics:  Cipro 07/17/13>>>  Flagyl 07/17/13>>>>  HPI/Subjective: Sitting up in bed visiting with family. Denies abdominal pain/nause/diarrhe. Requesting food as states "i am hungry". Objective: Filed Vitals:   07/18/13 0921  BP: 152/82  Pulse: 73  Temp:   Resp:     Intake/Output Summary (Last 24 hours) at 07/18/13 1118 Last data filed at 07/18/13 0528  Gross per 24 hour  Intake 2283.33 ml  Output    152 ml  Net 2131.33 ml   Filed Weights   07/17/13 0653  Weight: 104.327 kg (230 lb)    Exam:   General:  Obese smiling NAD  Cardiovascular: RRR No LE edema PPP  Respiratory: normal effort BS clear bilaterally to ausculation. No wheeze   Abdomen: obese soft +BS through out non-tender to palpation  Musculoskeletal: good muscle tone. No clubbing no cyanosis  Data Reviewed: Basic Metabolic Panel:  Recent Labs Lab 07/17/13 0701 07/18/13 0554  NA 134* 135  K 3.7 3.0*  CL 98 102  CO2 21 23  GLUCOSE 341* 180*  BUN 8 5*  CREATININE 0.67 0.55  CALCIUM 9.0 7.8*   Liver Function Tests:  Recent Labs Lab 07/17/13 0701  AST 12  ALT 8  ALKPHOS 74  BILITOT 0.4  PROT 7.1  ALBUMIN 3.1*    Recent Labs Lab 07/17/13 0701  LIPASE 24   No results found for this basename:  AMMONIA,  in the last 168 hours CBC:  Recent Labs Lab 07/17/13 0701 07/18/13 0554  WBC 13.9* 8.9  NEUTROABS 11.6*  --   HGB 14.2 11.6*  HCT 42.6 36.0  MCV 85.0 85.9  PLT 259 218   Cardiac Enzymes: No results found for this basename: CKTOTAL, CKMB, CKMBINDEX, TROPONINI,  in the last 168 hours BNP (last 3 results) No results found for this basename: PROBNP,  in the last 8760 hours CBG:  Recent Labs Lab 07/17/13 0722 07/17/13 1706 07/17/13 2120 07/18/13 0749  GLUCAP 319* 209* 180* 186*    Recent Results (from the past 240 hour(s))  CLOSTRIDIUM DIFFICILE BY PCR     Status: None   Collection Time    07/17/13  9:30 PM      Result Value Range Status   C difficile by pcr NEGATIVE  NEGATIVE Final     Studies: Ct Abdomen Pelvis W Contrast  07/17/2013   CLINICAL DATA:  Abdominal pain. History of gastric lap band.  EXAM: CT ABDOMEN AND PELVIS WITH CONTRAST  TECHNIQUE: Multidetector CT imaging of the abdomen and pelvis was performed using the standard protocol following bolus administration of intravenous contrast.  CONTRAST:  OMNIPAQUE IOHEXOL 300 MG/ML  SOLN  COMPARISON:  02/07/2011.  FINDINGS: The lung bases are clear.  There is diffuse fatty infiltration of the liver but no focal hepatic lesions or intrahepatic biliary dilatation. The gallbladder is surgically absent. No common bile duct dilatation. The pancreas is normal except for mild atrophy. The spleen is normal in size. No focal lesions. Adrenal glands and kidneys are unremarkable.  The gastric lap band is well positioned and in normal orientation. No complicating features. The stomach, duodenum bulb and C-loop are normal. The proximal loops of small bowel are unremarkable. There are thickened loops of small bowel in the right abdomen extending all the way to the distal ileum. The actual terminal ileum is unremarkable. Findings most consistent with a severe enteritis. Crohn's disease is possible. There is associated free  abdominal and pelvic fluid but no discrete rim enhancing abscess. No small bowel obstruction. There is mild inflammation of the cecum also. The appendix is normal. No mesenteric or retroperitoneal mass or adenopathy. Small scattered lymph nodes are noted. The aorta is normal in caliber. The major branch vessels are patent.  The uterus and ovaries are unremarkable. Small to moderate amount of free pelvic fluid. No pelvic mass or adenopathy. No inguinal mass or adenopathy.  The bony structures are unremarkable.  IMPRESSION: Significant inflammatory or infectious process involving the small bowel. No findings for functional obstruction, perforation or abscess.  Free abdominal and pelvic fluid due to be above.  No abdominal/pelvic mass or adenopathy.  Gastric lap band is well positioned without complicating features.   Electronically Signed   By: Loralie Champagne M.D.   On: 07/17/2013 12:44    Scheduled Meds: . amitriptyline  10 mg Oral Daily  . amLODipine  5 mg Oral Daily  . ciprofloxacin  400 mg Intravenous Q12H  . enoxaparin (LOVENOX) injection  40 mg Subcutaneous Q24H  . insulin aspart  0-15 Units Subcutaneous TID WC  . insulin aspart  0-5  Units Subcutaneous QHS  . insulin detemir  30 Units Subcutaneous Q breakfast  . lisinopril  40 mg Oral Daily  . metronidazole  500 mg Intravenous Q8H  . potassium chloride  40 mEq Oral Q4H   Continuous Infusions: . sodium chloride 125 mL/hr at 07/18/13 1007    Principal Problem:   Enteritis Active Problems:   Nausea vomiting and diarrhea   Abdominal pain   DM (diabetes mellitus)   HTN (hypertension)   Hyperlipemia   Obesity, Class III, BMI 40-49.9 (morbid obesity)   Hyponatremia   Leukocytosis   Hyperglycemia   Tachycardia   Hypokalemia    Time spent: 30 minutes    [facility] M  [facility] Pager [phone]. If 7PM-7AM, please contact night-coverage at [email or web address], password [facility] 07/18/2013, 11:18 AM  LOS: 1 day

## 2013-07-18 NOTE — ED Notes (Signed)
Pt reporting she was admitted for Chron's flare up, and discharged about 4pm today.  Reporting increased pain, nausea, vomiting and diarrhea.

## 2013-07-19 LAB — PROTEIN ELECTROPH W RFLX QUANT IMMUNOGLOBULINS
Beta 2: 6.8 % — ABNORMAL HIGH (ref 3.2–6.5)
Beta Globulin: 10 % — ABNORMAL HIGH (ref 4.7–7.2)
M-Spike, %: NOT DETECTED g/dL
Total Protein ELP: 6.4 g/dL (ref 6.0–8.3)

## 2013-07-19 LAB — IGG, IGA, IGM: IgM, Serum: 70 mg/dL (ref 52–322)

## 2013-07-19 MED ORDER — OXYCODONE-ACETAMINOPHEN 5-325 MG PO TABS
1.0000 | ORAL_TABLET | Freq: Once | ORAL | Status: AC
  Administered 2013-07-19: 1 via ORAL
  Filled 2013-07-19: qty 1

## 2013-07-19 MED ORDER — METOCLOPRAMIDE HCL 10 MG PO TABS
10.0000 mg | ORAL_TABLET | Freq: Once | ORAL | Status: AC
  Administered 2013-07-19: 10 mg via ORAL
  Filled 2013-07-19: qty 1

## 2013-07-19 MED ORDER — METOCLOPRAMIDE HCL 10 MG PO TABS: 10.0000 mg | ORAL_TABLET | Freq: Four times a day (QID) | ORAL | Status: DC | PRN

## 2013-07-19 MED ORDER — OXYCODONE-ACETAMINOPHEN 5-325 MG PO TABS: 1.0000 | ORAL_TABLET | ORAL | Status: DC | PRN

## 2013-07-19 NOTE — ED Notes (Signed)
Pt tolerating p.o intake with no nausea or vomiting.  Reports however, that she is beginning to have some more pain.

## 2013-07-22 MED FILL — Oxycodone w/ Acetaminophen Tab 5-325 MG: ORAL | Qty: 6 | Status: AC

## 2013-07-25 ENCOUNTER — Telehealth: Payer: Self-pay

## 2013-07-25 MED ORDER — MESALAMINE ER 250 MG PO CPCR: 1000.0000 mg | ORAL_CAPSULE | Freq: Three times a day (TID) | ORAL | Status: DC

## 2013-07-25 NOTE — Telephone Encounter (Signed)
Pt is aware.  

## 2013-07-25 NOTE — Telephone Encounter (Signed)
Pt left Vm that she has appt with Dr. Darrick Penna on 08/22/2013 but she will be out of Pentasa tomorrow. Can she get prescription sent to pharmacy?

## 2013-07-25 NOTE — Telephone Encounter (Signed)
PLEASE CALL PT. RX SENT. OPV DEC 2014.

## 2013-08-22 ENCOUNTER — Ambulatory Visit (INDEPENDENT_AMBULATORY_CARE_PROVIDER_SITE_OTHER): Payer: BC Managed Care – PPO | Admitting: Gastroenterology

## 2013-08-22 ENCOUNTER — Encounter (INDEPENDENT_AMBULATORY_CARE_PROVIDER_SITE_OTHER): Payer: Self-pay

## 2013-08-22 ENCOUNTER — Encounter: Payer: Self-pay | Admitting: Gastroenterology

## 2013-08-22 VITALS — BP 190/100 | HR 69 | Temp 98.8°F | Ht 66.0 in | Wt 262.8 lb

## 2013-08-22 DIAGNOSIS — K5000 Crohn's disease of small intestine without complications: Secondary | ICD-10-CM

## 2013-08-22 DIAGNOSIS — K5090 Crohn's disease, unspecified, without complications: Secondary | ICD-10-CM

## 2013-08-22 MED ORDER — MESALAMINE ER 500 MG PO CPCR: ORAL_CAPSULE | ORAL | Status: DC

## 2013-08-22 NOTE — Patient Instructions (Addendum)
COMPLETE PENTASA 250 MG TABLETS 4 THREE TIMES DAILY THEN START PENTASA 500 MG TABLETS 2 THREE TIMES A DAY.  FOLLOW UP IN 6 MOS. MERRY CHRISTMAS AND HAPPY NEW YEAR!  YOU NEED A  REPEAT CT SCAN OF THE ABDOMEN/PELVIS 1 WEEK PRIOR TO VISIT.  CALL WITH QUESTIONS OR CONCERNS.

## 2013-08-22 NOTE — Progress Notes (Signed)
cc'd to pcp 

## 2013-08-22 NOTE — Assessment & Plan Note (Signed)
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 SX APPEARED CONTROLLED ON PENTASA BUT PT STOPPED MEDS DUE TO EXPENSE.  DISCUSSED MANAGMENT OPTIONS-1. GI REFERRAL TO [facility]/? DOUBLE BALLOON ENTEROSCOPY OR 2. 6 MO TRIAL OF PENTASA AND REPEAT CT IN 6 MOS.  PT ELECTED TO TRY PENTASA FOR 6 MOS. OPV IN 6 MOS.

## 2013-08-22 NOTE — Progress Notes (Signed)
Subjective:    Patient ID: [name] [name], female    DOB: Jun 20, 1966, 47 y.o.   MRN: [id number]  HPI   HAD ANOTHER EPISODE OCT 2014-NVD, ABD PAIN, BUT IT HAD BEEN BEEN 2 YEARS SINCE A FLARE. WENT TO ED AND WAS ADMITTED. NO ASPIRIN, BC/GOODY POWDERS, OR NAPROXEN/ALEVE. NAUSEA/A LITTLE DIARRHEA/CRAMPY PAIN-"GAS PAIN" OVER THE WEEKEND: ALL DAY SAT WOKE UP SUN SHE WAS FINE. STOP PENTASA ONE YEAR AGO AND NOW TAKING IT AGAIN BECAUSE IT WAS VERY EXPENSIVE. STILL COST $75 FOR 30 DAY SUPPLY. WAS PAYING THAT FOR 7 DAYS SUPPLE. A LITTLE PAIN OVER THE WEEKEND. HAS BEEN TAKING IBUPROFEN(TWO OTC): AFTER OCT 2014. NO ETOH. PT DENIES FEVER, CHILLS, BRBPR, vomiting, melena, constipation, problems swallowing, problems with sedation, OR heartburn or indigestion. BMs: GOOD-2 NL SAUSAGE LIKE. ON PENTASA NOW SINCE BEGINNING OF NOV. FEELS BETTER, BUT OCCASIONAL ABD PAIN. WEIGHT: 259 2012 AND NOW 262. BP NL AT GYN OFC.  Past Medical History  Diagnosis Date  . 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 DM (diabetes mellitus)   . HTN (hypertension)   . Hyperlipemia   . Vitamin D deficiency   . Diverticulosis OCT 2011     PAN-COLONIC  . Obesity, Class III, BMI 40-49.9 (morbid obesity) AUG 2010 305 LBS    MAY 2012 267   Past Surgical History  Procedure Laterality Date  . Upper gastrointestinal endoscopy  MAY 2012 GIVENS CAPSULE PLACEMENT    GASTRITIS  . Laparoscopic gastric banding  AUG 2010 BMI 50    GASTRIC POUCH 2-3 CM  . Umbilical hernia repair  AUG 2010 MATTHEW MARTIN    INCARCERATED OMENTUM  . Cholecystectomy    . Abdominal exploration surgery  May 05, 2009 MM    INFLAMMED TI (SEROSITIS), "DIDN'T LOOK LIKE CROHN'S"  . Colonoscopy  DEC 2010 WO  . Colonoscopy  OCT 2011    MILD ERYTHEMA IN TI (30-40 CM), NL SB/COLON Bx   No Known Allergies  Current Outpatient Prescriptions  Medication Sig Dispense Refill  . amLODipine (NORVASC) 5 MG tablet Take 5 mg by mouth daily.      .  insulin aspart (NOVOLOG) 100 UNIT/ML injection Inject 2-10 Units into the skin See admin instructions. Sliding Scale: 0-200 = 2 units, 201-300 = 4 units, 301-350 = 6 units, 351-400 = 8 units, >401=10 units.  PT TAKES 15 UNITS BEFORE LUNCH AND SUPPER      . insulin detemir (LEVEMIR) 100 UNIT/ML injection Inject 60 Units into the skin daily after breakfast.        . lisinopril (PRINIVIL,ZESTRIL) 40 MG tablet Take 40 mg by mouth daily.     . mesalamine (PENTASA) 250 MG CR capsule Take 4 capsules (1,000 mg total) by mouth 3 (three) times daily.    . norgestimate-ethinyl estradiol (SPRINTEC 28) 0.25-35 MG-MCG tablet Take 1 tablet by mouth daily.    Marland Kitchen oxyCODONE-acetaminophen (PERCOCET/ROXICET) 5-325 MG per tablet Take 1 tablet by mouth every 4 (four) hours as needed.    Marland Kitchen amitriptyline (ELAVIL) 10 MG tablet Take 1 tablet by mouth daily.    Marland Kitchen living well with diabetes book MISC 1 each by Does not apply route once.    .      .      .       Family History  Problem Relation Age of Onset  . Colon cancer Neg Hx   . Colon polyps Neg Hx  History  Substance Use Topics  . Smoking status: Never Smoker   . Smokeless tobacco: Not on file     Comment: Never smoked  . Alcohol Use: No    Review of Systems PER HPI OTHERWISE ALL SYSTEMS ARE NEGATIVE.     Objective:   Physical Exam  Vitals reviewed. Constitutional: She is oriented to person, place, and time. She appears well-nourished. No distress.  HENT:  Head: Normocephalic and atraumatic.  Mouth/Throat: Oropharynx is clear and moist. No oropharyngeal exudate.  Eyes: Pupils are equal, round, and reactive to light. No scleral icterus.  Neck: Normal range of motion. Neck supple.  Cardiovascular: Normal rate, regular rhythm and normal heart sounds.   Pulmonary/Chest: Effort normal and breath sounds normal. No respiratory distress.  Abdominal: Soft. Bowel sounds are normal. She exhibits no distension. There is no tenderness.  Musculoskeletal: She  exhibits edema (TRACE BIL LE).  Lymphadenopathy:    She has no cervical adenopathy.  Neurological: She is alert and oriented to person, place, and time.  NO FOCAL DEFICITS   Psychiatric: She has a normal mood and affect.          Assessment & Plan:

## 2013-08-26 NOTE — Progress Notes (Signed)
Reminder in epic °

## 2014-02-06 ENCOUNTER — Telehealth: Payer: Self-pay | Admitting: Gastroenterology

## 2014-02-06 ENCOUNTER — Encounter: Payer: Self-pay | Admitting: Gastroenterology

## 2014-02-06 NOTE — Telephone Encounter (Signed)
Pt is on the June recall list and has OV for 6/24 at 0930 with SF. She also needs CT abd/pelvis w/cm in 6 months one week prior to OV

## 2014-02-07 ENCOUNTER — Encounter: Payer: Self-pay | Admitting: Gastroenterology

## 2014-02-07 NOTE — Telephone Encounter (Signed)
Letter has been mailed to patient

## 2014-02-26 ENCOUNTER — Other Ambulatory Visit: Payer: Self-pay | Admitting: Gastroenterology

## 2014-02-26 DIAGNOSIS — Z9884 Bariatric surgery status: Secondary | ICD-10-CM

## 2014-02-26 DIAGNOSIS — K529 Noninfective gastroenteritis and colitis, unspecified: Secondary | ICD-10-CM

## 2014-02-26 NOTE — Telephone Encounter (Signed)
Pt called and would like a call from Soledad Gerlach tomorrow in reference to her CT. Please call her on her cell at [phone].

## 2014-02-27 NOTE — Telephone Encounter (Signed)
Answered questions about picking up contrast

## 2014-03-03 ENCOUNTER — Telehealth: Payer: Self-pay

## 2014-03-03 NOTE — Telephone Encounter (Signed)
Pt called to cancel appt with Dr. Darrick PennaFields on 03/12/2014. Said she could not get off work. She would like Darl PikesSusan to call her back to schedule another appt. In the mean time she will look at her schedule and see what looks good.  She just needs follow up for crohns.

## 2014-03-05 NOTE — Telephone Encounter (Signed)
[facility]

## 2014-03-06 ENCOUNTER — Ambulatory Visit (HOSPITAL_COMMUNITY)
Admission: RE | Admit: 2014-03-06 | Discharge: 2014-03-06 | Disposition: A | Discharge status: Home / Self Care | Payer: BC Managed Care – PPO | Source: Ambulatory Visit | Attending: Gastroenterology | Admitting: Gastroenterology

## 2014-03-06 DIAGNOSIS — K5730 Diverticulosis of large intestine without perforation or abscess without bleeding: Secondary | ICD-10-CM | POA: Insufficient documentation

## 2014-03-06 DIAGNOSIS — K529 Noninfective gastroenteritis and colitis, unspecified: Secondary | ICD-10-CM

## 2014-03-06 DIAGNOSIS — R1011 Right upper quadrant pain: Secondary | ICD-10-CM | POA: Insufficient documentation

## 2014-03-06 DIAGNOSIS — R933 Abnormal findings on diagnostic imaging of other parts of digestive tract: Secondary | ICD-10-CM | POA: Insufficient documentation

## 2014-03-06 DIAGNOSIS — Z9884 Bariatric surgery status: Secondary | ICD-10-CM

## 2014-03-06 LAB — POCT I-STAT CREATININE: CREATININE: 0.8 mg/dL (ref 0.50–1.10)

## 2014-03-06 MED ORDER — IOHEXOL 300 MG/ML  SOLN
100.0000 mL | Freq: Once | INTRAMUSCULAR | Status: AC | PRN
  Administered 2014-03-06: 100 mL via INTRAVENOUS

## 2014-03-06 NOTE — Telephone Encounter (Signed)
PLEASE CALL PT. HER CT SHOWS A NORMAL Yvan Dority BOWEL. THE PENTASA IS WORKING. IT SHOWS THAT THE BOTTOM OF HER ESOPHAGUS IS SLIGHTLY THICKENED. THE CHANGES IN HER ESOPHAGUS ARE MORE LIKELY DUE TO A PILL OR FOOD SITTING IN HER ESOPHAGUS.HER LAST EGD WAS JUL 2012. SHE SHOULD HAVE AN EGD W/I THE NEXT 2-4 WEEKS. WE CAN TRIAGE HER FOR HER EGD. SHE SHOULD STRICTLY FOLLOW HER LAP BAND DIET. SHE NEEDS A CBC IN PRE-OP. HOLD LEVEMIR ON AM OF HER EGD. USE NOVOLOG SLIDING SCALE. 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

## 2014-03-07 NOTE — Telephone Encounter (Signed)
Called. Many rings and no answer.  

## 2014-03-11 NOTE — Telephone Encounter (Signed)
LMOM to call and mailed letter to call also.  

## 2014-03-11 NOTE — Telephone Encounter (Signed)
Reminder in epic °

## 2014-03-12 ENCOUNTER — Telehealth: Payer: Self-pay

## 2014-03-12 ENCOUNTER — Ambulatory Visit: Payer: BC Managed Care – PPO | Admitting: Gastroenterology

## 2014-03-12 NOTE — Telephone Encounter (Signed)
Pt called and was informed. See separate triage for the EGD.

## 2014-03-19 ENCOUNTER — Other Ambulatory Visit: Payer: Self-pay

## 2014-03-19 DIAGNOSIS — K222 Esophageal obstruction: Secondary | ICD-10-CM

## 2014-03-19 NOTE — Telephone Encounter (Signed)
Gastroenterology Pre-Procedure Review  Request Date: 03/19/2014 Requesting Physician: Dr. Darrick PennaFields  PER DR. FIELDS, TRIAGED FOR EGD  PATIENT REVIEW QUESTIONS: The patient responded to the following health history questions as indicated:    1. Diabetes Melitis: yes 2. Joint replacements in the past 12 months: no 3. Major health problems in the past 3 months: no 4. Has an artificial valve or MVP: no 5. Has a defibrillator: no 6. Has been advised in past to take antibiotics in advance of a procedure like teeth cleaning: no    MEDICATIONS & ALLERGIES:    Patient reports the following regarding taking any blood thinners:   Plavix? no Aspirin? no Coumadin? no  Patient confirms/reports the following medications:  Current Outpatient Prescriptions  Medication Sig Dispense Refill  . amitriptyline (ELAVIL) 10 MG tablet Take 1 tablet by mouth daily.      Marland Kitchen. amLODipine (NORVASC) 5 MG tablet Take 5 mg by mouth daily.      . Canagliflozin (INVOKANA) 300 MG TABS Take 300 mg by mouth daily. Takes in the AM      . insulin aspart (NOVOLOG) 100 UNIT/ML injection Inject 2-10 Units into the skin See admin instructions. Sliding Scale: 0-200 = 2 units, 201-300 = 4 units, 301-350 = 6 units, 351-400 = 8 units, >401=10 units.  PT TAKES 15 UNITS BEFORE LUNCH AND SUPPER      . insulin detemir (LEVEMIR) 100 UNIT/ML injection Inject 60 Units into the skin daily after breakfast.        . lisinopril (PRINIVIL,ZESTRIL) 40 MG tablet Take 40 mg by mouth daily.       . mesalamine (PENTASA) 500 MG CR capsule 2 PO TID  240 capsule  11  . metoCLOPramide (REGLAN) 10 MG tablet Take 1 tablet (10 mg total) by mouth every 6 (six) hours as needed.  30 tablet  0  . norgestimate-ethinyl estradiol (SPRINTEC 28) 0.25-35 MG-MCG tablet Take 1 tablet by mouth daily.      Marland Kitchen. oxyCODONE-acetaminophen (PERCOCET/ROXICET) 5-325 MG per tablet Take 1 tablet by mouth every 4 (four) hours as needed.  15 tablet  0  . rosuvastatin (CRESTOR) 10 MG  tablet Take 10 mg by mouth daily. Takes 1/2 tablet daily      . living well with diabetes book MISC 1 each by Does not apply route once.  1 each  0   No current facility-administered medications for this visit.    Patient confirms/reports the following allergies:  No Known Allergies  No orders of the defined types were placed in this encounter.    AUTHORIZATION INFORMATION Primary Insurance:   ID #:  Group #:  Pre-Cert / Auth required:  Pre-Cert / Auth #:   Secondary Insurance:   ID #: Group #:  Pre-Cert / Auth required:  Pre-Cert / Auth #:   SCHEDULE INFORMATION: Procedure has been scheduled as follows:  Date: 04/14/2014         Time:  8:30 AM Location[facility]  This Gastroenterology Pre-Precedure Review Form is being routed to the following provider(s): Jonette EvaSandi Fields, MD

## 2014-03-19 NOTE — Telephone Encounter (Signed)
LMOM for pt to call. I have scheduled her for EGD with Dr. Darrick PennaFields on 04/14/2014 at 8:30 AM.

## 2014-03-24 NOTE — Telephone Encounter (Signed)
SHE NEEDS A CBC IN PRE-OP. HOLD LEVEMIR ON AM OF HER EGD. USE NOVOLOG SLIDING SCALE.

## 2014-03-26 NOTE — Telephone Encounter (Signed)
The CBC in pre-op is noted on the orders per Selena BattenKim.  Instructions mailed to pt today.

## 2014-04-08 ENCOUNTER — Encounter (HOSPITAL_COMMUNITY): Payer: Self-pay | Admitting: Pharmacy Technician

## 2014-04-08 ENCOUNTER — Telehealth: Payer: Self-pay

## 2014-04-08 NOTE — Telephone Encounter (Signed)
I called [employer] at [phone] and they connected me to another number. I spoke to [place]an N who said that a PA is not required for outpatient procedure.

## 2014-04-09 ENCOUNTER — Telehealth: Payer: Self-pay

## 2014-04-09 NOTE — Telephone Encounter (Signed)
-----   Message -----  From: Ferne ReusSusan D Sherman  Sent: 04/08/2014 4:36 PM  To: Tomasa Hoseamille M McCollum, Sandi L Fields, MD, *   [facility] OV on 7/28 since she was having a procedure on 7/27 and [facility]. I told her if she has any questions to call me back.  ----- Message -----  From: Trudee Kusteroris Hill Orlandus Borowski, LPN  Sent: 8/29/56217/21/2015 4:24 PM  To: Gildardo GriffesSusan D Sherman   Susan, this pt has an appt with Dr. Darrick PennaFields on 04/15/2014 and she has a procedure on 04/14/2014. Might want to check and see if it needs to be changed. Thanks!

## 2014-04-11 ENCOUNTER — Telehealth: Payer: Self-pay

## 2014-04-11 NOTE — Telephone Encounter (Signed)
Called [phone] and was able to speak with the pt. Reviewed her instructions. She just wanted to make sure she did everything correctly.  Pt expressed understanding .

## 2014-04-11 NOTE — Telephone Encounter (Signed)
Pt left Vm that she had some questions about her instructions for the EGD on Mon. I tried to call her work number that she left( [phone]) and there were many rings and no answer.

## 2014-04-14 ENCOUNTER — Encounter (HOSPITAL_COMMUNITY)
Admission: RE | Disposition: A | Discharge status: Home / Self Care | Payer: Self-pay | Source: Ambulatory Visit | Attending: Gastroenterology

## 2014-04-14 ENCOUNTER — Encounter (HOSPITAL_COMMUNITY): Payer: Self-pay | Admitting: *Deleted

## 2014-04-14 ENCOUNTER — Ambulatory Visit (HOSPITAL_COMMUNITY)
Admission: RE | Admit: 2014-04-14 | Discharge: 2014-04-14 | Disposition: A | Discharge status: Home / Self Care | Payer: BC Managed Care – PPO | Source: Ambulatory Visit | Attending: Gastroenterology | Admitting: Gastroenterology

## 2014-04-14 DIAGNOSIS — R197 Diarrhea, unspecified: Secondary | ICD-10-CM

## 2014-04-14 DIAGNOSIS — R1310 Dysphagia, unspecified: Secondary | ICD-10-CM | POA: Insufficient documentation

## 2014-04-14 DIAGNOSIS — R1013 Epigastric pain: Secondary | ICD-10-CM

## 2014-04-14 DIAGNOSIS — R112 Nausea with vomiting, unspecified: Secondary | ICD-10-CM

## 2014-04-14 DIAGNOSIS — E785 Hyperlipidemia, unspecified: Secondary | ICD-10-CM | POA: Insufficient documentation

## 2014-04-14 DIAGNOSIS — Z794 Long term (current) use of insulin: Secondary | ICD-10-CM | POA: Insufficient documentation

## 2014-04-14 DIAGNOSIS — K208 Other esophagitis without bleeding: Secondary | ICD-10-CM | POA: Insufficient documentation

## 2014-04-14 DIAGNOSIS — E669 Obesity, unspecified: Secondary | ICD-10-CM | POA: Insufficient documentation

## 2014-04-14 DIAGNOSIS — Z9884 Bariatric surgery status: Secondary | ICD-10-CM | POA: Insufficient documentation

## 2014-04-14 DIAGNOSIS — I10 Essential (primary) hypertension: Secondary | ICD-10-CM | POA: Insufficient documentation

## 2014-04-14 DIAGNOSIS — K2980 Duodenitis without bleeding: Secondary | ICD-10-CM | POA: Insufficient documentation

## 2014-04-14 DIAGNOSIS — E119 Type 2 diabetes mellitus without complications: Secondary | ICD-10-CM | POA: Insufficient documentation

## 2014-04-14 DIAGNOSIS — Z6837 Body mass index (BMI) 37.0-37.9, adult: Secondary | ICD-10-CM | POA: Insufficient documentation

## 2014-04-14 DIAGNOSIS — K3189 Other diseases of stomach and duodenum: Secondary | ICD-10-CM | POA: Insufficient documentation

## 2014-04-14 DIAGNOSIS — K222 Esophageal obstruction: Secondary | ICD-10-CM

## 2014-04-14 DIAGNOSIS — K2940 Chronic atrophic gastritis without bleeding: Secondary | ICD-10-CM | POA: Insufficient documentation

## 2014-04-14 DIAGNOSIS — Z79899 Other long term (current) drug therapy: Secondary | ICD-10-CM | POA: Insufficient documentation

## 2014-04-14 HISTORY — PX: ESOPHAGOGASTRODUODENOSCOPY: SHX5428

## 2014-04-14 HISTORY — DX: Crohn's disease of large intestine without complications: K50.10

## 2014-04-14 LAB — GLUCOSE, CAPILLARY: Glucose-Capillary: 165 mg/dL — ABNORMAL HIGH (ref 70–99)

## 2014-04-14 LAB — CBC
HEMATOCRIT: 43.1 % (ref 36.0–46.0)
Hemoglobin: 14.2 g/dL (ref 12.0–15.0)
MCH: 28.8 pg (ref 26.0–34.0)
MCHC: 32.9 g/dL (ref 30.0–36.0)
MCV: 87.4 fL (ref 78.0–100.0)
PLATELETS: 251 10*3/uL (ref 150–400)
RBC: 4.93 MIL/uL (ref 3.87–5.11)
RDW: 12.9 % (ref 11.5–15.5)
WBC: 10.6 10*3/uL — AB (ref 4.0–10.5)

## 2014-04-14 SURGERY — EGD (ESOPHAGOGASTRODUODENOSCOPY)
Anesthesia: Moderate Sedation

## 2014-04-14 MED ORDER — MEPERIDINE HCL 100 MG/ML IJ SOLN
INTRAMUSCULAR | Status: DC | PRN
  Administered 2014-04-14 (×2): 25 mg via INTRAVENOUS
  Administered 2014-04-14: 50 mg via INTRAVENOUS

## 2014-04-14 MED ORDER — MEPERIDINE HCL 100 MG/ML IJ SOLN
INTRAMUSCULAR | Status: AC
  Filled 2014-04-14: qty 2

## 2014-04-14 MED ORDER — SODIUM CHLORIDE 0.9 % IV SOLN
INTRAVENOUS | Status: DC
  Administered 2014-04-14: 08:00:00 via INTRAVENOUS

## 2014-04-14 MED ORDER — MIDAZOLAM HCL 5 MG/5ML IJ SOLN
INTRAMUSCULAR | Status: AC
  Filled 2014-04-14: qty 10

## 2014-04-14 MED ORDER — STERILE WATER FOR IRRIGATION IR SOLN
Status: DC | PRN
  Administered 2014-04-14: 09:00:00

## 2014-04-14 MED ORDER — MIDAZOLAM HCL 5 MG/5ML IJ SOLN
INTRAMUSCULAR | Status: DC | PRN
  Administered 2014-04-14: 1 mg via INTRAVENOUS
  Administered 2014-04-14 (×2): 2 mg via INTRAVENOUS
  Administered 2014-04-14: 1 mg via INTRAVENOUS

## 2014-04-14 MED ORDER — OMEPRAZOLE 20 MG PO CPDR: DELAYED_RELEASE_CAPSULE | ORAL | Status: DC

## 2014-04-14 MED ORDER — LIDOCAINE VISCOUS 2 % MT SOLN
OROMUCOSAL | Status: AC
  Filled 2014-04-14: qty 15

## 2014-04-14 MED ORDER — LIDOCAINE VISCOUS 2 % MT SOLN
OROMUCOSAL | Status: DC | PRN
  Administered 2014-04-14: 1 via OROMUCOSAL

## 2014-04-14 NOTE — Op Note (Signed)
[facility] [address] [place], [id number]   ENDOSCOPY PROCEDURE REPORT  PATIENT: [name] [name], [name] G.  MR#: [id number] BIRTHDATE: 1965-11-16 , 48  yrs. old GENDER: Female  ENDOSCOPIST: Jonette EvaSandi Fields, MD REFERRED HY:QMVHQIBY:Ashish Sherryll BurgerShah, M.D. PROCEDURE DATE: 04/14/2014 PROCEDURE:   EGD w/ biopsy  INDICATIONS:Dyspepsia.   Dysphagia. GASTRIC LAP BAND SURGERY AUG 2010. LAST EGD 2012: GASTRIC POUCH 2-3 CM. MEDICATIONS: Demerol 100 mg IV, Versed, and Versed 6 mg IV TOPICAL ANESTHETIC:   Viscous Xylocaine  DESCRIPTION OF PROCEDURE:     Physical exam was performed.  Informed consent was obtained from the patient after explaining the benefits, risks, and alternatives to the procedure.  The patient was connected to the monitor and placed in the left lateral position.  Continuous oxygen was provided by nasal cannula and IV medicine administered through an indwelling cannula.  After administration of sedation, the patients esophagus was intubated and the EG-[id number] ([id number]  endoscope was advanced under direct visualization to the second portion of the duodenum.  The scope was removed slowly by carefully examining the color, texture, anatomy, and integrity of the mucosa on the way out.  The patient was recovered in endoscopy and discharged home in satisfactory condition.   ESOPHAGUS: ONE LINEAR EROSION AT THE GE JUNCTION.   STOMACH: GASTRIC POUCH 1-2 CM IN SIZE.  EMPTIES INTO A  10 CM CANAL.   Mild non-erosive gastritis (inflammation) was found in the gastric antrum.  Multiple biopsies were performed using cold forceps. DUODENUM: Moderate duodenal inflammation was found in the duodenal bulb.   The duodenal mucosa showed no abnormalities in the 2nd part of the duodenum. COMPLICATIONS:   None  ENDOSCOPIC IMPRESSION: 1.   EROSIVE ESOPHAGITIS 2.   NORMAL GASTRIC POUCH 3.   MILD Non-erosive gastritis es 4.   MODERTAE DUODENITIS the duodenal bulb 5.  SUSPECT HER DYSPHAGIA IS DUE  TO NON-ADHERENCE TO A POST GASTRIC BAND DIET  RECOMMENDATIONS: Use Prilosec 30 minutes prior to your first meal. FOLLOW A soft mechanical/GASTRIC BYPASS DIET.  DO NOT EAT CHUNKS OF ANYTHING.  MEATS SHOULD BE CHOPPED OR GROUND ONLY. DISCUSSED BENEFFITOF FOLLOWING A POST GASTRIC BAND DIET. BIOPSY WILL BE BACK IN 7 TO 10 DAYS.  FOLLOW UP in OCT 2015.   REPEAT EXAM:   _______________________________ Rosalie DoctoreSignedJonette Eva:  Sandi Fields, MD 04/14/2014 3:43 PM

## 2014-04-14 NOTE — H&P (Addendum)
Primary Care Physician:  Kirstie Peri, MD Primary Gastroenterologist:  Dr. Darrick Penna  Pre-Procedure History & Physical: HPI:  [name] [name] is a 48 y.o. female here for ABNORMAL ESOPHAGUS ON CT/DYSPHAGIA.  Past Medical History  Diagnosis Date  . 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 DM (diabetes mellitus)   . HTN (hypertension)   . Hyperlipemia   . Vitamin D deficiency   . Diverticulosis OCT 2011     PAN-COLONIC  . Obesity, Class III, BMI 40-49.9 (morbid obesity) AUG 2010 305 LBS    MAY 2012 267  . Crohn's colitis     Past Surgical History  Procedure Laterality Date  . Upper gastrointestinal endoscopy  MAY 2012 GIVENS CAPSULE PLACEMENT    GASTRITIS  . Laparoscopic gastric banding  AUG 2010 BMI 50    GASTRIC POUCH 2-3 CM  . Umbilical hernia repair  AUG 2010 MATTHEW MARTIN    INCARCERATED OMENTUM  . Cholecystectomy    . Abdominal exploration surgery  May 05, 2009 MM    INFLAMMED TI (SEROSITIS), "DIDN'T LOOK LIKE CROHN'S"  . Colonoscopy  DEC 2010 WO  . Colonoscopy  OCT 2011    MILD ERYTHEMA IN TI (30-40 CM), NL SB/COLON Bx    Prior to Admission medications   Medication Sig Start Date End Date Taking? Authorizing Provider  amLODipine (NORVASC) 5 MG tablet Take 5 mg by mouth daily.   Yes Historical Provider, MD  Canagliflozin (INVOKANA) 300 MG TABS Take 300 mg by mouth daily. Takes in the AM   Yes Historical Provider, MD  insulin aspart (NOVOLOG) 100 UNIT/ML injection Inject 2-10 Units into the skin See admin instructions. Sliding Scale: 0-200 = 2 units, 201-300 = 4 units, 301-350 = 6 units, 351-400 = 8 units, >401=10 units.  PATIENT TAKES 10 UNITS BEFORE SUPPER EVERYDAY.   Yes Historical Provider, MD  insulin detemir (LEVEMIR) 100 UNIT/ML injection Inject 60 Units into the skin daily after breakfast.     Yes Historical Provider, MD  lisinopril (PRINIVIL,ZESTRIL) 40 MG tablet Take 40 mg by mouth daily.  02/12/11  Yes Historical Provider, MD   mesalamine (PENTASA) 500 MG CR capsule 2 PO TID 08/22/13  Yes West Bali, MD  norgestimate-ethinyl estradiol (SPRINTEC 28) 0.25-35 MG-MCG tablet Take 1 tablet by mouth daily.   Yes Historical Provider, MD  rosuvastatin (CRESTOR) 10 MG tablet Take 10 mg by mouth daily. Takes 1/2 tablet daily   Yes Historical Provider, MD  amitriptyline (ELAVIL) 10 MG tablet Take 1 tablet by mouth at bedtime as needed for sleep.  06/02/13   Historical Provider, MD    Allergies as of 03/19/2014  . (No Known Allergies)    Family History  Problem Relation Age of Onset  . Colon cancer Neg Hx   . Colon polyps Neg Hx   . Stomach cancer Neg Hx     History   Social History  . Marital Status: Married    Spouse Name: N/A    Number of Children: N/A  . Years of Education: N/A   Occupational History  . Not on file.   Social History Main Topics  . Smoking status: Never Smoker   . Smokeless tobacco: Not on file     Comment: Never smoked  . Alcohol Use: No  . Drug Use: No  . Sexual Activity: Not on file   Other Topics Concern  . Not on file   Social History Narrative   MARRIED,  NO KIDS    Review of Systems: See HPI, otherwise negative ROS   Physical Exam: BP 162/84  Pulse 77  Temp(Src) 97.8 F (36.6 C) (Oral)  Resp 20  Ht 5\' 6"  (1.676 m)  Wt 234 lb (106.142 kg)  BMI 37.79 kg/m2  SpO2 99%  LMP 03/31/2014 General:   Alert,  pleasant and cooperative in NAD Head:  Normocephalic and atraumatic. Neck:  Supple; Lungs:  Clear throughout to auscultation.    Heart:  Regular rate and rhythm. Abdomen:  Soft, nontender and nondistended. Normal bowel sounds, without guarding, and without rebound.   Neurologic:  Alert and  oriented x4;  grossly normal neurologically.  Impression/Plan:    abnormal esophagus on CT/DYSPHAGIA  PLAN: 1. EGD/?DILATION TODAY

## 2014-04-14 NOTE — Discharge Instructions (Signed)
YOUR STOMACH POUCH IS 1-2 CM AND IT EMPTIES INTO A CHANNEL THAT IS 10 CM WIDE. You have esophagitis, gastritis & DUODENITIS. I biopsied your stomach.   Use Prilosec 30 minutes prior to your first meal.  FOLLOW A soft mechanical/GASTRIC BYPASS DIET. DO NOT EAT CHUNKS OF ANYTHING.  MEATS SHOULD BE CHOPPED OR GROUND ONLY. SEE INFO BELOW.  YOUR BIOPSY WILL BE BACK IN 7 TO 10 DAYS.  FOLLOW UP in OCT 2015  UPPER ENDOSCOPY AFTER CARE Read the instructions outlined below and refer to this sheet in the next week. These discharge instructions provide you with general information on caring for yourself after you leave the hospital. While your treatment has been planned according to the most current medical practices available, unavoidable complications occasionally occur. If you have any problems or questions after discharge, call DR. Armiyah Capron, [phone].  ACTIVITY  You may resume your regular activity, but move at a slower pace for the next 24 hours.   Take frequent rest periods for the next 24 hours.   Walking will help get rid of the air and reduce the bloated feeling in your belly (abdomen).   No driving for 24 hours (because of the medicine (anesthesia) used during the test).   You may shower.   Do not sign any important legal documents or operate any machinery for 24 hours (because of the anesthesia used during the test).    NUTRITION  Drink plenty of fluids.   You may resume your normal diet as instructed by your doctor.   Begin with a light meal and progress to your normal diet. Heavy or fried foods are harder to digest and may make you feel sick to your stomach (nauseated).   Avoid alcoholic beverages for 24 hours or as instructed.    MEDICATIONS  You may resume your normal medications.   WHAT YOU CAN EXPECT TODAY  Some feelings of bloating in the abdomen.   Passage of more gas than usual.    IF YOU HAD A BIOPSY TAKEN DURING THE UPPER ENDOSCOPY:  Eat a soft diet  IF YOU HAVE NAUSEA, BLOATING, ABDOMINAL PAIN, OR VOMITING.    FINDING OUT THE RESULTS OF YOUR TEST Not all test results are available during your visit. DR. Darrick Penna WILL CALL YOU WITHIN 7 DAYS OF YOUR PROCEDUE WITH YOUR RESULTS. Do not assume everything is normal if you have not heard from DR. Krystyne Tewksbury IN ONE WEEK, CALL HER OFFICE AT [phone].  SEEK IMMEDIATE MEDICAL ATTENTION AND CALL THE OFFICE: [phone] IF:  You have more than a spotting of blood in your stool.   Your belly is swollen (abdominal distention).   You are nauseated or vomiting.   You have a temperature over 101F.   You have abdominal pain or discomfort that is severe or gets worse throughout the day.   Gastritis/DUODENITIS  Gastritis is an inflammation (the body's way of reacting to injury and/or infection) of the stomach. DUODENITIS is an inflammation (the body's way of reacting to injury and/or infection) of the FIRST PART OF THE SMALL INTESTINES. It is often caused by bacterial (germ) infections. It can also be caused BY ASPIRIN, BC/GOODY POWDER'S, (IBUPROFEN) MOTRIN, OR ALEVE (NAPROXEN), chemicals (including alcohol), SPICY FOODS, and medications. This illness may be associated with generalized malaise (feeling tired, not well), UPPER ABDOMINAL STOMACH cramps, and fever. One common bacterial cause of gastritis is an organism known as H. Pylori. This can be treated with antibiotics.     REFLUX   TREATMENT  There are a number of non-prescription medicines used to treat reflux including: Antacids.  ZANTAC Proton-pump inhibitors: PRILOSEC OR NEXIUM  HOME CARE INSTRUCTIONS Eat 2-3 hours before going to bed.  Try to reach and maintain a healthy weight. LOSE 10-20 LBS Do not eat just a few very large meals. Instead, eat 4 TO 6 smaller meals throughout the day.  Try to identify foods and beverages that make your symptoms worse, and avoid these.  Avoid tight clothing.  Do not exercise right after  eating.   SOFT MECHANICAL DIET This SOFT MECHANICAL DIET is restricted to:  Foods that are moist, soft-textured, and easy to chew and swallow.   Meats that are ground or are minced no larger than one-quarter inch pieces. Meats are moist with gravy or sauce added.   Foods that do not include bread or bread-like textures except soft pancakes, well-moistened with syrup or sauce.   Textures with some chewing ability required.   Casseroles without rice.   Cooked vegetables that are less than half an inch in size and easily mashed with a fork. No cooked corn, peas, broccoli, cauliflower, cabbage, Brussels sprouts, asparagus, or other fibrous, non-tender or rubbery cooked vegetables.   Canned fruit except for pineapple. Fruit must be cut into pieces no larger than half an inch in size.   Foods that do not include nuts, seeds, coconut, or sticky textures.   FOOD TEXTURES FOR DYSPHAGIA DIET LEVEL 2 -SOFT MECHANICAL DIET (includes all foods on Dysphagia Diet Level 1 - Pureed, in addition to the foods listed below)  FOOD GROUP: Breads. RECOMMENDED: Soft pancakes, well-moistened with syrup or sauce.  AVOID: All others.  FOOD GROUP: Cereals.  RECOMMENDED: Cooked cereals with little texture, including oatmeal. Unprocessed wheat bran stirred into cereals for bulk. Note: If thin liquids are restricted, it is important that all of the liquid is absorbed into the cereal.  AVOID: All dry cereals and any cooked cereals that may contain flax seeds or other seeds or nuts. Whole-grain, dry, or coarse cereals. Cereals with nuts, seeds, dried fruit, and/or coconut.  FOOD GROUP: Desserts. RECOMMENDED: Pudding, custard. Soft fruit pies with bottom crust only. Canned fruit (excluding pineapple). Soft, moist cakes with icing.Frozen malts, milk shakes, frozen yogurt, eggnog, nutritional supplements, ice cream, sherbet, regular or sugar-free gelatin, or any foods that become thin liquid at either room (70 F)  or body temperature (98 F).  AVOID: Dry, coarse cakes and cookies. Anything with nuts, seeds, coconut, pineapple, or dried fruit. Breakfast yogurt with nuts. Rice or bread pudding.  FOOD GROUP: Fats. RECOMMENDED: Butter, margarine, cream for cereal (depending on liquid consistency recommendations), gravy, cream sauces, sour cream, sour cream dips with soft additives, mayonnaise, salad dressings, cream cheese, cream cheese spreads with soft additives, whipped toppings.  AVOID: All fats with coarse or chunky additives.  FOOD GROUP: Fruits. RECOMMENDED: Soft drained, canned, or cooked fruits without seeds or skin. Fresh soft and ripe banana. Fruit juices with a small amount of pulp. If thin liquids are restricted, fruit juices should be thickened to appropriate consistency.  AVOID: Fresh or frozen fruits. Cooked fruit with skin or seeds. Dried fruits. Fresh, canned, or cooked pineapple.  FOOD GROUP: Meats and Meat Substitutes. (Meat pieces should not exceed 1/4 of an inch cube and should be tender.) RECOMMENDED: Moistened ground or cooked meat, poultry, or fish. Moist ground or tender meat may be served with gravy or sauce. Casseroles without rice. Moist macaroni and cheese, well-cooked pasta with meat sauce, tuna noodle  casserole, soft, moist lasagna. Moist meatballs, meatloaf, or fish loaf. Protein salads, such as tuna or egg without large chunks, celery, or onion. Cottage cheese, smooth quiche without large chunks. Poached, scrambled, or soft-cooked eggs (egg yolks should not be runny but should be moist and able to be mashed with butter, margarine, or other moisture added to them). (Cook eggs to 160 F or use pasteurized eggs for safety.) Souffls may have small, soft chunks. Tofu. Well-cooked, slightly mashed, moist legumes, such as baked beans. All meats or protein substitutes should be served with sauces or moistened to help maintain cohesiveness in the oral cavity.  AVOID: Dry meats,  tough meats (such as bacon, sausage, hot dogs, bratwurst). Dry casseroles or casseroles with rice or large chunks. Peanut butter. Cheese slices and cubes. Hard-cooked or crisp fried eggs. Sandwiches.Pizza.  FOOD GROUP: Potatoes and Starches. RECOMMENDED: Well-cooked, moistened, boiled, baked, or mashed potatoes. Well-cooked shredded hash brown potatoes that are not crisp. (All potatoes need to be moist and in sauces.)Well-cooked noodles in sauce. Spaetzel or soft dumplings that have been moistened with butter or gravy.  AVOID: Potato skins and chips. Fried or French-fried potatoes. Rice.  FOOD GROUP: Soups. RECOMMENDED: Soups with easy-to-chew or easy-to-swallow meats or vegetables: Particle sizes in soups should be less than 1/2 inch. Soups will need to be thickened to appropriate consistency if soup is thinner than prescribed liquid consistency.  AVOID: Soups with large chunks of meat and vegetables. Soups with rice, corn, peas.  FOOD GROUP: Vegetables. RECOMMENDED: All soft, well-cooked vegetables. Vegetables should be less than a half inch. Should be easily mashed with a fork.  AVOID: Cooked corn and peas. Broccoli, cabbage, Brussels sprouts, asparagus, or other fibrous, non-tender or rubbery cooked vegetables.  FOOD GROUP: Miscellaneous. RECOMMENDED: Jams and preserves without seeds, jelly. Sauces, salsas, etc., that may have small tender chunks less than 1/2 inch. Soft, smooth chocolate bars that are easily chewed.  AVOID: Seeds, nuts, coconut, or sticky foods. Chewy candies such as caramels or licorice.

## 2014-04-15 ENCOUNTER — Ambulatory Visit: Payer: BC Managed Care – PPO | Admitting: Gastroenterology

## 2014-04-15 ENCOUNTER — Telehealth: Payer: Self-pay

## 2014-04-15 NOTE — Telephone Encounter (Signed)
Pt called and said she did not remember what Dr. Darrick PennaFields said after her EGD yesterday. I read the recommendations to her from the procedure note. She wants to know if she is supposed to contact the doctor who did her gastric by pass for any reason. Please advise!

## 2014-04-18 ENCOUNTER — Encounter (HOSPITAL_COMMUNITY): Payer: Self-pay | Admitting: Gastroenterology

## 2014-04-22 NOTE — Telephone Encounter (Signed)
REVIEWED.  

## 2014-04-24 NOTE — Telephone Encounter (Signed)
Reminder in epic °

## 2014-04-24 NOTE — Telephone Encounter (Signed)
Please call pt. HER stomach Bx shows mild gastritis.    Use Prilosec 30 minutes prior to your first meal.  FOLLOW A soft mechanical/GASTRIC BYPASS DIET. DO NOT EAT CHUNKS OF ANYTHING.  MEATS SHOULD BE CHOPPED OR GROUND ONLY.   FOLLOW UP in OCT 2015 E30 DYSPHAGIA/DYSPEPSIA.

## 2014-04-29 NOTE — Telephone Encounter (Signed)
Called and informed pt.  

## 2014-05-22 ENCOUNTER — Other Ambulatory Visit (INDEPENDENT_AMBULATORY_CARE_PROVIDER_SITE_OTHER): Payer: Self-pay | Admitting: *Deleted

## 2014-05-22 ENCOUNTER — Encounter (INDEPENDENT_AMBULATORY_CARE_PROVIDER_SITE_OTHER): Payer: Self-pay

## 2014-05-22 DIAGNOSIS — R1310 Dysphagia, unspecified: Secondary | ICD-10-CM

## 2014-05-22 DIAGNOSIS — Z4651 Encounter for fitting and adjustment of gastric lap band: Secondary | ICD-10-CM

## 2014-05-27 ENCOUNTER — Other Ambulatory Visit: Payer: BC Managed Care – PPO

## 2014-05-29 ENCOUNTER — Encounter: Payer: Self-pay | Admitting: Gastroenterology

## 2014-06-03 ENCOUNTER — Other Ambulatory Visit: Payer: BC Managed Care – PPO

## 2014-06-10 ENCOUNTER — Ambulatory Visit
Admission: RE | Admit: 2014-06-10 | Discharge: 2014-06-10 | Disposition: A | Discharge status: Home / Self Care | Payer: BC Managed Care – PPO | Source: Ambulatory Visit | Attending: Physician Assistant | Admitting: Physician Assistant

## 2014-06-10 DIAGNOSIS — Z4651 Encounter for fitting and adjustment of gastric lap band: Secondary | ICD-10-CM

## 2014-06-10 DIAGNOSIS — R1310 Dysphagia, unspecified: Secondary | ICD-10-CM

## 2014-08-08 ENCOUNTER — Other Ambulatory Visit: Payer: Self-pay | Admitting: Gastroenterology

## 2014-11-25 ENCOUNTER — Emergency Department (HOSPITAL_COMMUNITY)
Admission: EM | Admit: 2014-11-25 | Discharge: 2014-11-25 | Discharge status: Against Medical Advice | Payer: BLUE CROSS/BLUE SHIELD | Attending: Emergency Medicine | Admitting: Emergency Medicine

## 2014-11-25 ENCOUNTER — Encounter (HOSPITAL_COMMUNITY): Payer: Self-pay | Admitting: Emergency Medicine

## 2014-11-25 DIAGNOSIS — I10 Essential (primary) hypertension: Secondary | ICD-10-CM | POA: Insufficient documentation

## 2014-11-25 DIAGNOSIS — E1165 Type 2 diabetes mellitus with hyperglycemia: Secondary | ICD-10-CM | POA: Diagnosis not present

## 2014-11-25 LAB — CBG MONITORING, ED: Glucose-Capillary: 286 mg/dL — ABNORMAL HIGH (ref 70–99)

## 2014-11-25 NOTE — ED Notes (Signed)
Pt called to treatment area. No response. WR check and pt called to three.

## 2014-11-25 NOTE — ED Notes (Signed)
Pt reports feeling dizzy, not feeling well Pt checked her blood sugar at work, 400.

## 2015-04-21 ENCOUNTER — Other Ambulatory Visit: Payer: Self-pay | Admitting: Gastroenterology

## 2015-04-24 ENCOUNTER — Other Ambulatory Visit: Payer: Self-pay | Admitting: Gastroenterology

## 2015-11-08 ENCOUNTER — Encounter (HOSPITAL_COMMUNITY): Payer: Self-pay | Admitting: Emergency Medicine

## 2015-11-08 ENCOUNTER — Emergency Department (HOSPITAL_COMMUNITY)
Admission: EM | Admit: 2015-11-08 | Discharge: 2015-11-08 | Disposition: A | Discharge status: Home / Self Care | Payer: BLUE CROSS/BLUE SHIELD | Attending: Emergency Medicine | Admitting: Emergency Medicine

## 2015-11-08 DIAGNOSIS — E119 Type 2 diabetes mellitus without complications: Secondary | ICD-10-CM | POA: Insufficient documentation

## 2015-11-08 DIAGNOSIS — E785 Hyperlipidemia, unspecified: Secondary | ICD-10-CM | POA: Insufficient documentation

## 2015-11-08 DIAGNOSIS — R300 Dysuria: Secondary | ICD-10-CM | POA: Diagnosis present

## 2015-11-08 DIAGNOSIS — Z793 Long term (current) use of hormonal contraceptives: Secondary | ICD-10-CM | POA: Insufficient documentation

## 2015-11-08 DIAGNOSIS — K5010 Crohn's disease of large intestine without complications: Secondary | ICD-10-CM | POA: Insufficient documentation

## 2015-11-08 DIAGNOSIS — N390 Urinary tract infection, site not specified: Secondary | ICD-10-CM | POA: Insufficient documentation

## 2015-11-08 DIAGNOSIS — Z79899 Other long term (current) drug therapy: Secondary | ICD-10-CM | POA: Insufficient documentation

## 2015-11-08 DIAGNOSIS — Z7984 Long term (current) use of oral hypoglycemic drugs: Secondary | ICD-10-CM | POA: Diagnosis not present

## 2015-11-08 DIAGNOSIS — I10 Essential (primary) hypertension: Secondary | ICD-10-CM | POA: Diagnosis not present

## 2015-11-08 DIAGNOSIS — Z794 Long term (current) use of insulin: Secondary | ICD-10-CM | POA: Insufficient documentation

## 2015-11-08 DIAGNOSIS — Z3202 Encounter for pregnancy test, result negative: Secondary | ICD-10-CM | POA: Diagnosis not present

## 2015-11-08 LAB — URINE MICROSCOPIC-ADD ON

## 2015-11-08 LAB — URINALYSIS, ROUTINE W REFLEX MICROSCOPIC
BILIRUBIN URINE: NEGATIVE
Ketones, ur: NEGATIVE mg/dL
Leukocytes, UA: NEGATIVE
Nitrite: POSITIVE — AB
PROTEIN: NEGATIVE mg/dL
Specific Gravity, Urine: <1.005 — ABNORMAL LOW (ref 1.005–1.030)
pH: 5.5 (ref 5.0–8.0)

## 2015-11-08 LAB — POC URINE PREG, ED: PREG TEST UR: NEGATIVE

## 2015-11-08 MED ORDER — CEPHALEXIN 500 MG PO CAPS: 500.0000 mg | ORAL_CAPSULE | Freq: Four times a day (QID) | ORAL | Status: DC

## 2015-11-08 MED ORDER — CEPHALEXIN 500 MG PO CAPS
500.0000 mg | ORAL_CAPSULE | Freq: Once | ORAL | Status: AC
Start: 2015-11-08 — End: 2015-11-08
  Administered 2015-11-08: 500 mg via ORAL
  Filled 2015-11-08: qty 1

## 2015-11-08 NOTE — ED Notes (Signed)
Patient c/o pelvic pressure and pain. Patient reports urgency to urinate and dysuria. Per patient nausea with some diarrhea. Denies any vomiting.

## 2015-11-08 NOTE — Discharge Instructions (Signed)

## 2015-11-08 NOTE — ED Provider Notes (Signed)
CSN: [id number]     Arrival date & time 11/08/15  1704 History  By signing my name below, I, Doreatha Martin, attest that this documentation has been prepared under the direction and in the presence of Shayanna Thatch, PA-C. Electronically Signed: Doreatha Martin, ED Scribe. 11/08/2015. 5:56 PM.    Chief Complaint  Patient presents with  . Dysuria   The history is provided by the patient. No language interpreter was used.    HPI Comments: [name] [name] is a 50 y.o. female with h/o DM, HTN, HLD, obesity, diverticulosis, class III obesity  who presents to the Emergency Department complaining of moderate suprapubic pressure and pain onset 3 days ago with associated dysuria, urgency, frequency, chills, nausea. Pt notes that her urine volume has not significantly decreased. She notes that she started taking azo 2 days ago and reports her urine has been orange tinted. No recent changes in sexual partner, no exposure to STD. She denies known hematuria, fever, emesis, vaginal discharge or bleeding.   Past Medical History  Diagnosis Date  . Ileitis, terminal (HCC) JUL 2012 CE: TI ULCER    ASCA 4.3 NL pANCA 7.4  . DM (diabetes mellitus) (HCC)   . HTN (hypertension)   . Hyperlipemia   . Vitamin D deficiency   . Diverticulosis OCT 2011     PAN-COLONIC  . Obesity, Class III, BMI 40-49.9 (morbid obesity) (HCC) AUG 2010 305 LBS    MAY 2012 267  . Crohn's colitis [facility])    Past Surgical History  Procedure Laterality Date  . Upper gastrointestinal endoscopy  MAY 2012 GIVENS CAPSULE PLACEMENT    GASTRITIS  . Laparoscopic gastric banding  AUG 2010 BMI 50    GASTRIC POUCH 2-3 CM  . Umbilical hernia repair  AUG 2010 MATTHEW MARTIN    INCARCERATED OMENTUM  . Cholecystectomy    . Abdominal exploration surgery  May 05, 2009 MM    INFLAMMED TI (SEROSITIS), "DIDN'T LOOK LIKE CROHN'S"  . Colonoscopy  DEC 2010 WO  . Colonoscopy  OCT 2011    MILD ERYTHEMA IN TI (30-40 CM), NL SB/COLON Bx  .  Esophagogastroduodenoscopy N/A 04/14/2014    Procedure: ESOPHAGOGASTRODUODENOSCOPY (EGD);  Surgeon: West Bali, MD;  Location: [facility];  Service: Endoscopy;  Laterality: N/A;  8:30 AM   Family History  Problem Relation Age of Onset  . Colon cancer Neg Hx   . Colon polyps Neg Hx   . Stomach cancer Neg Hx   . Diabetes Mother   . Diabetes Father   . Diabetes Other    Social History  Substance Use Topics  . Smoking status: Never Smoker   . Smokeless tobacco: Never Used     Comment: Never smoked  . Alcohol Use: No   OB History    No data available     Review of Systems  Constitutional: Positive for chills. Negative for fever.  Gastrointestinal: Positive for nausea and abdominal pain. Negative for vomiting.  Genitourinary: Positive for dysuria, urgency and frequency. Negative for hematuria, decreased urine volume, vaginal bleeding and vaginal discharge.  Musculoskeletal: Negative for back pain.   Allergies  Review of patient's allergies indicates no known allergies.  Home Medications   Prior to Admission medications   Medication Sig Start Date End Date Taking? Authorizing Provider  amitriptyline (ELAVIL) 10 MG tablet Take 1 tablet by mouth at bedtime as needed for sleep.  06/02/13   Historical Provider, MD  amLODipine (NORVASC) 5 MG tablet Take 5 mg by  mouth daily.    Historical Provider, MD  Canagliflozin (INVOKANA) 300 MG TABS Take 300 mg by mouth daily. Takes in the AM    Historical Provider, MD  insulin aspart (NOVOLOG) 100 UNIT/ML injection Inject 2-10 Units into the skin See admin instructions. Sliding Scale: 0-200 = 2 units, 201-300 = 4 units, 301-350 = 6 units, 351-400 = 8 units, >401=10 units.  PATIENT TAKES 10 UNITS BEFORE SUPPER EVERYDAY.    Historical Provider, MD  insulin detemir (LEVEMIR) 100 UNIT/ML injection Inject 60 Units into the skin daily after breakfast.      Historical Provider, MD  lisinopril (PRINIVIL,ZESTRIL) 40 MG tablet Take 40 mg by mouth  daily.  02/12/11   Historical Provider, MD  mesalamine (PENTASA) 500 MG CR capsule 2 PO TID 08/22/13   West Bali, MD  norgestimate-ethinyl estradiol (SPRINTEC 28) 0.25-35 MG-MCG tablet Take 1 tablet by mouth daily.    Historical Provider, MD  omeprazole (PRILOSEC) 20 MG capsule take 1 capsule by mouth every morning 30 MINUTES PRIOR TO FIRST MEAL 04/21/15   Nira Retort, NP  omeprazole (PRILOSEC) 20 MG capsule take 1 capsule by mouth every morning 30 MINUTES PRIOR TO FIRST MEAL 04/24/15   Nira Retort, NP  PENTASA 250 MG CR capsule take 4 capsules by mouth three times a day 08/08/14   Tiffany Kocher, PA-C  rosuvastatin (CRESTOR) 10 MG tablet Take 10 mg by mouth daily. Takes 1/2 tablet daily    Historical Provider, MD   BP 154/93 mmHg  Pulse 95  Temp(Src) 99.2 F (37.3 C) (Oral)  Resp 16  Ht 5\' 6"  (1.676 m)  Wt 223 lb (101.152 kg)  BMI 36.01 kg/m2  SpO2 100%  LMP 11/01/2015 Physical Exam  Constitutional: She is oriented to person, place, and time. She appears well-developed and well-nourished.  HENT:  Head: Normocephalic and atraumatic.  Eyes: Conjunctivae and EOM are normal. Pupils are equal, round, and reactive to light.  Neck: Normal range of motion. Neck supple.  Cardiovascular: Normal rate, regular rhythm and normal heart sounds.  Exam reveals no gallop and no friction rub.   No murmur heard. Pulmonary/Chest: Effort normal and breath sounds normal. No respiratory distress. She has no wheezes. She has no rales. She exhibits no tenderness.  Abdominal: She exhibits no distension. There is tenderness.  Localized suprapubic tenderness. Remaining abdominal exam WNL. No CVA tenderness.   Musculoskeletal: Normal range of motion. She exhibits no tenderness.  Neurological: She is alert and oriented to person, place, and time.  Skin: Skin is warm and dry.  Psychiatric: She has a normal mood and affect. Her behavior is normal.  Nursing note and vitals reviewed.   ED Course  Procedures  (including critical care time) DIAGNOSTIC STUDIES: Oxygen Saturation is 100% on RA, normal by my interpretation.    COORDINATION OF CARE: 5:54 PM Discussed treatment plan with pt at bedside which includes UA and pt agreed to plan.   Labs Review Labs Reviewed  URINALYSIS, ROUTINE W REFLEX MICROSCOPIC (NOT AT [facility])  POC URINE PREG, ED    I have personally reviewed and evaluated these lab results as part of my medical decision-making.   MDM   Final diagnoses:  Urinary tract infection without complication   Pt with dysuria symptoms and equivocal U/A.  Culture pending.  Discussed care plan with Dr. Adriana Simas and will tx with keflex and pt agrees to close f/u with her PMD.  No CVA tenderness, fever, or vomiting to suggest pyelonephritis.  She is well appearing, non-toxic and stable for d/c.  Vitals stable.   I personally performed the services described in this documentation, which was scribed in my presence. The recorded information has been reviewed and is accurate.   Pauline Aus, PA-C 11/10/15 1625  Donnetta Hutching, MD 11/11/15 [phone]

## 2015-11-11 LAB — URINE CULTURE: Culture: >=100000

## 2015-11-12 ENCOUNTER — Telehealth (HOSPITAL_BASED_OUTPATIENT_CLINIC_OR_DEPARTMENT_OTHER): Payer: Self-pay | Admitting: Emergency Medicine

## 2015-11-12 NOTE — Telephone Encounter (Signed)
Post ED Visit - Positive Culture Follow-up  Culture report reviewed by antimicrobial stewardship pharmacist:   Enzo Bi, Pharm.D.  Celedonio Miyamoto, Pharm.D., BCPS  Garvin Fila, Pharm.D.  Georgina Pillion, Pharm.D., BCPS  Bug Tussle, [place].D., BCPS, AAHIVP  Estella Husk, Pharm.D., BCPS, AAHIVP  Tennis Must, [address].D.  Sherle Poe, [address].D.  Positive urine culture E. coli Treated with cephalexin, organism sensitive to the same and no further patient follow-up is required at this time.  Berle Mull 11/12/2015, 10:12 AM

## 2016-06-24 ENCOUNTER — Encounter (HOSPITAL_COMMUNITY): Payer: Self-pay

## 2017-05-01 LAB — BASIC METABOLIC PANEL
BUN: 12 (ref 4–21)
Creatinine: 0.8 (ref <=1.1)

## 2017-05-23 LAB — HEMOGLOBIN A1C: Hemoglobin A1C: 11

## 2017-06-20 ENCOUNTER — Ambulatory Visit: Payer: Self-pay | Admitting: "Endocrinology

## 2017-06-20 ENCOUNTER — Ambulatory Visit (INDEPENDENT_AMBULATORY_CARE_PROVIDER_SITE_OTHER): Payer: Self-pay | Admitting: "Endocrinology

## 2017-06-20 ENCOUNTER — Encounter: Payer: Self-pay | Admitting: "Endocrinology

## 2017-06-20 VITALS — BP 165/97 | HR 98 | Ht 66.0 in | Wt 269.0 lb

## 2017-06-20 DIAGNOSIS — I10 Essential (primary) hypertension: Secondary | ICD-10-CM | POA: Diagnosis not present

## 2017-06-20 DIAGNOSIS — E1165 Type 2 diabetes mellitus with hyperglycemia: Secondary | ICD-10-CM | POA: Diagnosis not present

## 2017-06-20 MED ORDER — ONETOUCH VERIO W/DEVICE KIT: 1.0000 | PACK | 0 refills | Status: DC | PRN

## 2017-06-20 MED ORDER — GLUCOSE BLOOD VI STRP: ORAL_STRIP | 2 refills | Status: DC

## 2017-06-20 NOTE — Progress Notes (Signed)
Subjective:    Patient ID: [name] [name], female    DOB: 06-23-1966.  she is being seen in consultation for management of currently uncontrolled symptomatic diabetes requested by  Monico Blitz, MD.   Past Medical History:  Diagnosis Date  . Crohn's colitis ([place])   . Diverticulosis OCT 2011    PAN-COLONIC  . DM (diabetes mellitus) ([place])   . HTN (hypertension)   . Hyperlipemia   . 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 Obesity, Class III, BMI 40-49.9 (morbid obesity) ([place]) AUG 2010 305 LBS   MAY 2012 267  . Vitamin D deficiency    Past Surgical History:  Procedure Laterality Date  . ABDOMINAL EXPLORATION SURGERY  May 05, 2009 MM   INFLAMMED TI (SEROSITIS), "DIDN'T LOOK LIKE CROHN'S"  . CHOLECYSTECTOMY    . COLONOSCOPY  DEC 2010 WO  . COLONOSCOPY  OCT 2011   MILD ERYTHEMA IN TI (30-40 CM), NL SB/COLON Bx  . ESOPHAGOGASTRODUODENOSCOPY N/A 04/14/2014   Procedure: ESOPHAGOGASTRODUODENOSCOPY (EGD);  Surgeon: Danie Binder, MD;  Location: [facility];  Service: Endoscopy;  Laterality: N/A;  8:30 AM  . LAPAROSCOPIC GASTRIC BANDING  AUG 2010 BMI 50   GASTRIC POUCH 2-3 CM  . UMBILICAL HERNIA REPAIR  AUG 2010 MATTHEW MARTIN   INCARCERATED OMENTUM  . UPPER GASTROINTESTINAL ENDOSCOPY  MAY 2012 [facility]LE PLACEMENT   GASTRITIS   Social History   Social History  . Marital status: Married    Spouse name: N/A  . Number of children: N/A  . Years of education: N/A   Social History Main Topics  . Smoking status: Never Smoker  . Smokeless tobacco: Never Used     Comment: Never smoked  . Alcohol use No  . Drug use: No  . Sexual activity: Not Asked   Other Topics Concern  . None   Social History Narrative   MARRIED, NO KIDS   Outpatient Encounter Prescriptions as of 06/20/2017  Medication Sig  . insulin degludec (TRESIBA) 100 UNIT/ML SOPN FlexTouch Pen Inject 50 Units into the skin at bedtime.  Marland Kitchen JARDIANCE 25 MG TABS tablet Take 25 mg  by mouth daily.  Marland Kitchen lisinopril (PRINIVIL,ZESTRIL) 40 MG tablet Take 40 mg by mouth daily.   Marland Kitchen losartan-hydrochlorothiazide (HYZAAR) 50-12.5 MG tablet Take 1 tablet by mouth daily.  . norgestimate-ethinyl estradiol (ORTHO-CYCLEN,SPRINTEC,PREVIFEM) 0.25-35 MG-MCG tablet Take 1 tablet by mouth daily.  . potassium chloride (K-DUR) 10 MEQ tablet Take 10 mEq by mouth daily.  Marland Kitchen torsemide (DEMADEX) 20 MG tablet Take 20 mg by mouth daily.  . Blood Glucose Monitoring Suppl (ONETOUCH VERIO) w/Device KIT 1 each by Does not apply route as needed.  Marland Kitchen glucose blood (ONETOUCH VERIO) test strip Use as instructed  . omeprazole (PRILOSEC) 20 MG capsule take 1 capsule by mouth every morning 30 MINUTES PRIOR TO FIRST MEAL  . [DISCONTINUED] amLODipine (NORVASC) 5 MG tablet Take 5 mg by mouth daily.  . [DISCONTINUED] cephALEXin (KEFLEX) 500 MG capsule Take 1 capsule (500 mg total) by mouth 4 (four) times daily. For 7 days  . [DISCONTINUED] clobetasol ointment (TEMOVATE) 1.61 % Apply 1 application topically daily as needed. For itching  . [DISCONTINUED] insulin aspart (NOVOLOG) 100 UNIT/ML injection Inject 2-10 Units into the skin 2 (two) times daily. Sliding Scale: 0-200 = 2 units, 201-300 = 4 units, 301-350 = 6 units, 351-400 = 8 units, >401=10 units.  PATIENT TAKES 10 UNITS BEFORE SUPPER  EVERYDAY.  . [DISCONTINUED] insulin detemir (LEVEMIR) 100 UNIT/ML injection Inject 70 Units into the skin daily after breakfast.   . [DISCONTINUED] mesalamine (PENTASA) 500 MG CR capsule 2 PO TID (Patient taking differently: Take 1,000 mg by mouth 3 (three) times daily. 2 PO TID)  . [DISCONTINUED] norgestimate-ethinyl estradiol (SPRINTEC 28) 0.25-35 MG-MCG tablet Take 1 tablet by mouth daily.   No facility-administered encounter medications on file as of 06/20/2017.     ALLERGIES: No Known Allergies  VACCINATION STATUS:  There is no immunization history on file for this patient.  Diabetes  She presents for her initial  diabetic visit. She has type 2 diabetes mellitus. Onset time: She was diagnosed at approximate age of 68 years. Her disease course has been worsening. There are no hypoglycemic associated symptoms. Pertinent negatives for hypoglycemia include no confusion, headaches, pallor or seizures. Associated symptoms include fatigue, polydipsia and polyuria. Pertinent negatives for diabetes include no chest pain and no polyphagia. There are no hypoglycemic complications. Symptoms are worsening. There are no diabetic complications. Risk factors for coronary artery disease include diabetes mellitus, dyslipidemia, family history, obesity, hypertension and sedentary lifestyle. Current diabetic treatments: She is taking toujeo 50-75 units twice a day,jardiance 25 mg by mouth daily. Her weight is increasing steadily (She weighed maximum was 279 pounds before her lap band surgery 2010 with subsequent loss of 117 pounds. Unfortunately she has regained almost all of that weight [name] to current weight of 269 pounds.). She is following a generally unhealthy diet. When asked about meal planning, she reported none. She has not had a previous visit with a dietitian. She rarely participates in exercise. (She did not bring any meter nor logs to review today. She says she does not have a functioning meter.) An ACE inhibitor/angiotensin II receptor blocker is being taken.  Hypertension  This is a chronic problem. The current episode started more than 1 year ago. The problem is uncontrolled. Pertinent negatives include no chest pain, headaches, palpitations or shortness of breath. Risk factors for coronary artery disease include diabetes mellitus, obesity, sedentary lifestyle, family history and dyslipidemia. Past treatments include ACE inhibitors.       Review of Systems  Constitutional: Positive for fatigue. Negative for chills, fever and unexpected weight change.  HENT: Negative for trouble swallowing and voice change.   Eyes:  Negative for visual disturbance.  Respiratory: Negative for cough, shortness of breath and wheezing.   Cardiovascular: Negative for chest pain, palpitations and leg swelling.  Gastrointestinal: Negative for diarrhea, nausea and vomiting.  Endocrine: Positive for polydipsia and polyuria. Negative for cold intolerance, heat intolerance and polyphagia.  Musculoskeletal: Negative for arthralgias and myalgias.  Skin: Negative for color change, pallor, rash and wound.  Neurological: Negative for seizures and headaches.  Psychiatric/Behavioral: Negative for confusion and suicidal ideas.    Objective:    BP (!) 165/97   Pulse 98   Ht 5' 6"  (1.676 m)   Wt 269 lb (122 kg)   BMI 43.42 kg/m   Wt Readings from Last 3 Encounters:  06/20/17 269 lb (122 kg)  11/08/15 223 lb (101.2 kg)  11/25/14 254 lb (115.2 kg)     Physical Exam  Constitutional: She is oriented to person, place, and time. She appears well-developed.  HENT:  Head: Normocephalic and atraumatic.  Eyes: EOM are normal.  Neck: Normal range of motion. Neck supple. No tracheal deviation present. No thyromegaly present.  Cardiovascular: Normal rate and regular rhythm.   Pulmonary/Chest: Effort normal and breath sounds normal.  Abdominal: Soft. Bowel sounds are normal. There is no tenderness. There is no guarding.  Musculoskeletal: Normal range of motion. She exhibits no edema.  Neurological: She is alert and oriented to person, place, and time. She has normal reflexes. No cranial nerve deficit. Coordination normal.  Skin: Skin is warm and dry. No rash noted. No erythema. No pallor.  Psychiatric: She has a normal mood and affect. Judgment normal.    CMP ( most recent) CMP     Component Value Date/Time   NA 136 07/18/2013 2307   K 3.3 (L) 07/18/2013 2307   CL 100 07/18/2013 2307   CO2 20 07/18/2013 2307   GLUCOSE 175 (H) 07/18/2013 2307   BUN 12 05/01/2017   CREATININE 0.8 05/01/2017   CREATININE 0.80 03/06/2014 0937    CALCIUM 9.1 07/18/2013 2307   PROT 7.1 07/17/2013 0701   ALBUMIN 3.1 (L) 07/17/2013 0701   AST 12 07/17/2013 0701   ALT 8 07/17/2013 0701   ALKPHOS 74 07/17/2013 0701   BILITOT 0.4 07/17/2013 0701   GFRNONAA >90 07/18/2013 2307   GFRAA >90 07/18/2013 2307     Diabetic Labs (most recent): Lab Results  Component Value Date   HGBA1C 11 05/23/2017   HGBA1C 10.1 (H) 07/17/2013   HGBA1C (H) 02/07/2011    10.1 (NOTE)                                                                       According to the ADA Clinical Practice Recommendations for 2011, when HbA1c is used as a screening test:   >=6.5%   Diagnostic of Diabetes Mellitus           (if abnormal result  is confirmed)  5.7-6.4%   Increased risk of developing Diabetes Mellitus  References:Diagnosis and Classification of Diabetes Mellitus,Diabetes ATFT,7322,02(RKYHC 1):S62-S69 and Standards of Medical Care in         Diabetes - 2011,Diabetes Care,2011,34  (Suppl 1):S11-S61.     Lipid Panel ( most recent) Lipid Panel     Component Value Date/Time   CHOL 158 07/17/2013 0701   TRIG 222 (H) 07/17/2013 0701   HDL 46 07/17/2013 0701   CHOLHDL 3.4 07/17/2013 0701   VLDL 44 (H) 07/17/2013 0701   LDLCALC 68 07/17/2013 0701    Assessment & Plan:   1. Uncontrolled type 2 diabetes mellitus with hyperglycemia ([place]) - Patient has currently uncontrolled symptomatic type 2 DM since  51 years of age,  with most recent A1c of 11 %. Recent labs reviewed.  -her diabetes is complicated by obesity/sedentary life and Florella G Stump remains at a high risk for more acute and chronic complications which include CAD, CVA, CKD, retinopathy, and neuropathy. These are all discussed in detail with the patient.  - I have counseled her on diet management and weight loss, by adopting a carbohydrate restricted/protein rich diet.  - Suggestion is made for her to avoid simple carbohydrates  from her diet including Cakes, Sweet Desserts, Ice Cream, Soda (diet  and regular), Sweet Tea, Candies, Chips, Cookies, Store Bought Juices, Alcohol in Excess of  1-2 drinks a day, Artificial Sweeteners, and "Sugar-free" Products. This will help patient to have stable blood glucose profile and potentially avoid unintended weight gain.  -  I encouraged her to switch to  unprocessed or minimally processed complex starch and increased protein intake (animal or plant source), fruits, and vegetables.  - she is advised to stick to a routine mealtimes to eat 3 meals  a day and avoid unnecessary snacks ( to snack only to correct hypoglycemia).   - she will be scheduled with Jearld Fenton, RDN, CDE for individualized DM education.  - I have approached her with the following individualized plan to manage diabetes and patient agrees:   -  He is not on her recent A1c of 11% and due to the fact that her prior to measurements of A1c were  above 10% , she may need basal/bolus insulin to treat diabetes to target.  - However, it is essential to assure her commitment for proper monitoring of blood glucose for safe use of insulin.  -  I  will proceed to readjust her basal insulin  Toujeo to 50 units daily at bedtime , ssociated with strict monitoring of glucose 4 times a day-before meals and at bedtime. - She is asked to return in one week with her meter and logs for reevaluation. - If her glycemic profile is significantly above target, she will be considered for bolus insulin. - Patient is warned not to take insulin without proper monitoring per orders. -Patient is encouraged to call clinic for blood glucose levels less than 70 or above 300 mg /dl. - I will continue  Jardiance 25 mg by mouth daily. This medication will be discontinued after she uses up her current supplies.  - She reports intolerance to metformin with GI side effects. - she will be considered for incretin therapy as appropriate next visit. - Patient specific target  A1c;  LDL, HDL, Triglycerides, and  Waist  Circumference were discussed in detail.  2) BP/HTN:  Uncontrolled. Continue current medications including ACEI/ARB. 3) Lipids/HPL:   Uncontrolled with LDL at 68.   Patient is not on  Statins, will be considered for low-dose statins on subsequent visits. 4)  Weight/Diet: CDE Consult will be initiated , exercise, and detailed carbohydrates information provided. - Patient reports history of weighing  279 pounds in the past which led to a LAP-BAND weight loss surgery in 2010 which help her lose 117 pounds. - Subsequently she has regained almost all of that weight [name] to current weight of 269 pounds.  5) Chronic Care/Health Maintenance:  -she  is on ACEI/ARB  medications and  is encouraged to continue to follow up with Ophthalmology, Dentist,  Podiatrist at least yearly or according to recommendations, and advised to  stay away from smoking. I have recommended yearly flu vaccine and pneumonia vaccination at least every 5 years; moderate intensity exercise for up to 150 minutes weekly; and  sleep for at least 7 hours a day.  - Time spent with the patient: 1 hour, of which >50% was spent in obtaining information about her symptoms, reviewing her previous labs, evaluations, and treatments, counseling her about her  currently uncontrolled, located type 2 diabetes, hypertension, obesity, and developing a plan for long term treatment.  - Patient to bring meter and  blood glucose logs during her next visit.  - I advised patient to maintain close follow up with Monico Blitz, MD for primary care needs.  Follow up plan: - Return in about 1 week (around 06/27/2017) for follow up with meter and logs- no labs.  Glade Lloyd, MD Phone: [phone]  Fax: [phone]   06/20/2017, 3:31 PM This note was  partially dictated with voice recognition software. Similar sounding words can be transcribed inadequately or may not  be corrected upon review.

## 2017-06-20 NOTE — Patient Instructions (Signed)

## 2017-06-21 ENCOUNTER — Other Ambulatory Visit: Payer: Self-pay

## 2017-06-21 MED ORDER — LANCETS THIN MISC: 1.0000 | Freq: Four times a day (QID) | 5 refills | Status: DC

## 2017-06-26 ENCOUNTER — Encounter: Payer: Self-pay | Admitting: "Endocrinology

## 2017-06-26 ENCOUNTER — Ambulatory Visit (INDEPENDENT_AMBULATORY_CARE_PROVIDER_SITE_OTHER): Payer: 59 | Admitting: "Endocrinology

## 2017-06-26 VITALS — BP 132/89 | HR 75 | Ht 66.0 in | Wt 275.0 lb

## 2017-06-26 DIAGNOSIS — I10 Essential (primary) hypertension: Secondary | ICD-10-CM

## 2017-06-26 DIAGNOSIS — E1165 Type 2 diabetes mellitus with hyperglycemia: Secondary | ICD-10-CM

## 2017-06-26 MED ORDER — INSULIN LISPRO 100 UNIT/ML (KWIKPEN): 10.0000 [IU] | PEN_INJECTOR | Freq: Three times a day (TID) | SUBCUTANEOUS | 2 refills | Status: DC

## 2017-06-26 NOTE — Patient Instructions (Signed)

## 2017-06-26 NOTE — Progress Notes (Signed)
Subjective:    Patient ID: [name] [name], female    DOB: [birth year].  she is being seen in consultation for management of currently uncontrolled symptomatic diabetes requested by  Monico Blitz, MD.   Past Medical History:  Diagnosis Date  . Crohn's colitis ([place])   . Diverticulosis OCT 2011    PAN-COLONIC  . DM (diabetes mellitus) ([place])   . HTN (hypertension)   . Hyperlipemia   . 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 Obesity, Class III, BMI 40-49.9 (morbid obesity) ([place]) AUG 2010 305 LBS   MAY 2012 267  . Vitamin D deficiency    Past Surgical History:  Procedure Laterality Date  . ABDOMINAL EXPLORATION SURGERY  May 05, 2009 MM   INFLAMMED TI (SEROSITIS), "DIDN'T LOOK LIKE CROHN'S"  . CHOLECYSTECTOMY    . COLONOSCOPY  DEC 2010 WO  . COLONOSCOPY  OCT 2011   MILD ERYTHEMA IN TI (30-40 CM), NL SB/COLON Bx  . ESOPHAGOGASTRODUODENOSCOPY N/A 04/14/2014   Procedure: ESOPHAGOGASTRODUODENOSCOPY (EGD);  Surgeon: Danie Binder, MD;  Location: [facility];  Service: Endoscopy;  Laterality: N/A;  8:30 AM  . LAPAROSCOPIC GASTRIC BANDING  AUG 2010 BMI 50   GASTRIC POUCH 2-3 CM  . UMBILICAL HERNIA REPAIR  AUG 2010 MATTHEW MARTIN   INCARCERATED OMENTUM  . UPPER GASTROINTESTINAL ENDOSCOPY  MAY 2012 [facility]   GASTRITIS   Social History   Social History  . Marital status: Married    Spouse name: N/A  . Number of children: N/A  . Years of education: N/A   Social History Main Topics  . Smoking status: Never Smoker  . Smokeless tobacco: Never Used     Comment: Never smoked  . Alcohol use No  . Drug use: No  . Sexual activity: Not Asked   Other Topics Concern  . None   Social History Narrative   MARRIED, NO KIDS   Outpatient Encounter Prescriptions as of 06/26/2017  Medication Sig  . Blood Glucose Monitoring Suppl (ONETOUCH VERIO) w/Device KIT 1 each by Does not apply route as needed.  Marland Kitchen glucose blood (ONETOUCH VERIO) test  strip Use as instructed  . insulin degludec (TRESIBA) 100 UNIT/ML SOPN FlexTouch Pen Inject 50 Units into the skin at bedtime.  . insulin lispro (HUMALOG KWIKPEN) 100 UNIT/ML KiwkPen Inject 0.1-0.16 mLs (10-16 Units total) into the skin 3 (three) times daily before meals.  . Lancets Thin MISC 1 each by Does not apply route 4 (four) times daily.  Marland Kitchen lisinopril (PRINIVIL,ZESTRIL) 40 MG tablet Take 40 mg by mouth daily.   Marland Kitchen losartan-hydrochlorothiazide (HYZAAR) 50-12.5 MG tablet Take 1 tablet by mouth daily.  . norgestimate-ethinyl estradiol (ORTHO-CYCLEN,SPRINTEC,PREVIFEM) 0.25-35 MG-MCG tablet Take 1 tablet by mouth daily.  Marland Kitchen omeprazole (PRILOSEC) 20 MG capsule take 1 capsule by mouth every morning 30 MINUTES PRIOR TO FIRST MEAL  . potassium chloride (K-DUR) 10 MEQ tablet Take 10 mEq by mouth daily.  Marland Kitchen torsemide (DEMADEX) 20 MG tablet Take 20 mg by mouth daily.  . [DISCONTINUED] JARDIANCE 25 MG TABS tablet Take 25 mg by mouth daily.   No facility-administered encounter medications on file as of 06/26/2017.     ALLERGIES: No Known Allergies  VACCINATION STATUS:  There is no immunization history on file for this patient.  Diabetes  She presents for her follow-up diabetic visit. She has type 2 diabetes mellitus. Onset time: She was diagnosed at approximate age of 67  years. Her disease course has been worsening. There are no hypoglycemic associated symptoms. Pertinent negatives for hypoglycemia include no confusion, headaches, pallor or seizures. Associated symptoms include fatigue, polydipsia and polyuria. Pertinent negatives for diabetes include no chest pain and no polyphagia. There are no hypoglycemic complications. Symptoms are worsening. There are no diabetic complications. Risk factors for coronary artery disease include diabetes mellitus, dyslipidemia, family history, obesity, hypertension and sedentary lifestyle. Current diabetic treatments: She is taking toujeo 50-75 units twice a  day,jardiance 25 mg by mouth daily. Her weight is increasing steadily (She weighed maximum was 279 pounds before her lap band surgery 2010 with subsequent loss of 117 pounds. Unfortunately she has regained almost all of that weight [name] to current weight of 269 pounds.). She is following a generally unhealthy diet. When asked about meal planning, she reported none. She has not had a previous visit with a dietitian. She rarely participates in exercise. Her breakfast blood glucose range is generally >200 mg/dl. Her lunch blood glucose range is generally >200 mg/dl. Her dinner blood glucose range is generally >200 mg/dl. Her bedtime blood glucose range is generally >200 mg/dl. Her overall blood glucose range is >200 mg/dl. An ACE inhibitor/angiotensin II receptor blocker is being taken.  Hypertension  This is a chronic problem. The current episode started more than 1 year ago. The problem is uncontrolled. Pertinent negatives include no chest pain, headaches, palpitations or shortness of breath. Risk factors for coronary artery disease include diabetes mellitus, obesity, sedentary lifestyle, family history and dyslipidemia. Past treatments include ACE inhibitors.     Review of Systems  Constitutional: Positive for fatigue. Negative for chills, fever and unexpected weight change.  HENT: Negative for trouble swallowing and voice change.   Eyes: Negative for visual disturbance.  Respiratory: Negative for cough, shortness of breath and wheezing.   Cardiovascular: Negative for chest pain, palpitations and leg swelling.  Gastrointestinal: Negative for diarrhea, nausea and vomiting.  Endocrine: Positive for polydipsia and polyuria. Negative for cold intolerance, heat intolerance and polyphagia.  Musculoskeletal: Negative for arthralgias and myalgias.  Skin: Negative for color change, pallor, rash and wound.  Neurological: Negative for seizures and headaches.  Psychiatric/Behavioral: Negative for confusion and  suicidal ideas.    Objective:    BP 132/89   Pulse 75   Ht 5' 6"  (1.676 m)   Wt 275 lb (124.7 kg)   BMI 44.39 kg/m   Wt Readings from Last 3 Encounters:  06/26/17 275 lb (124.7 kg)  06/20/17 269 lb (122 kg)  11/08/15 223 lb (101.2 kg)     Physical Exam  Constitutional: She is oriented to person, place, and time. She appears well-developed.  HENT:  Head: Normocephalic and atraumatic.  Eyes: EOM are normal.  Neck: Normal range of motion. Neck supple. No tracheal deviation present. No thyromegaly present.  Cardiovascular: Normal rate and regular rhythm.   Pulmonary/Chest: Effort normal and breath sounds normal.  Abdominal: Soft. Bowel sounds are normal. There is no tenderness. There is no guarding.  Musculoskeletal: Normal range of motion. She exhibits no edema.  Neurological: She is alert and oriented to person, place, and time. She has normal reflexes. No cranial nerve deficit. Coordination normal.  Skin: Skin is warm and dry. No rash noted. No erythema. No pallor.  Psychiatric: She has a normal mood and affect. Judgment normal.    CMP ( most recent) CMP     Component Value Date/Time   NA 136 07/18/2013 2307   K 3.3 (L) 07/18/2013 2307  CL 100 07/18/2013 2307   CO2 20 07/18/2013 2307   GLUCOSE 175 (H) 07/18/2013 2307   BUN 12 05/01/2017   CREATININE 0.8 05/01/2017   CREATININE 0.80 03/06/2014 0937   CALCIUM 9.1 07/18/2013 2307   PROT 7.1 07/17/2013 0701   ALBUMIN 3.1 (L) 07/17/2013 0701   AST 12 07/17/2013 0701   ALT 8 07/17/2013 0701   ALKPHOS 74 07/17/2013 0701   BILITOT 0.4 07/17/2013 0701   GFRNONAA >90 07/18/2013 2307   GFRAA >90 07/18/2013 2307     Diabetic Labs (most recent): Lab Results  Component Value Date   HGBA1C 11 05/23/2017   HGBA1C 10.1 (H) 07/17/2013   HGBA1C (H) 02/07/2011    10.1 (NOTE)                                                                       According to the ADA Clinical Practice Recommendations for 2011, when HbA1c is  used as a screening test:   >=6.5%   Diagnostic of Diabetes Mellitus           (if abnormal result  is confirmed)  5.7-6.4%   Increased risk of developing Diabetes Mellitus  References:Diagnosis and Classification of Diabetes Mellitus,Diabetes YKDX,8338,25(KNLZJ 1):S62-S69 and Standards of Medical Care in         Diabetes - 2011,Diabetes Care,2011,34  (Suppl 1):S11-S61.     Lipid Panel ( most recent) Lipid Panel     Component Value Date/Time   CHOL 158 07/17/2013 0701   TRIG 222 (H) 07/17/2013 0701   HDL 46 07/17/2013 0701   CHOLHDL 3.4 07/17/2013 0701   VLDL 44 (H) 07/17/2013 0701   LDLCALC 68 07/17/2013 0701    Assessment & Plan:   1. Uncontrolled type 2 diabetes mellitus with hyperglycemia ([place]) - Patient has currently uncontrolled symptomatic type 2 DM since  51 years of age. - She came with significantly above target blood glucose profile, averaging 291 in the last 7 days. Her most recent A1c was 11%. Recent labs reviewed.  -her diabetes is complicated by obesity/sedentary life and Cadience G Stump remains at a high risk for more acute and chronic complications which include CAD, CVA, CKD, retinopathy, and neuropathy. These are all discussed in detail with the patient.  - I have counseled her on diet management and weight loss, by adopting a carbohydrate restricted/protein rich diet.  - Suggestion is made for her to avoid simple carbohydrates  from her diet including Cakes, Sweet Desserts, Ice Cream, Soda (diet and regular), Sweet Tea, Candies, Chips, Cookies, Store Bought Juices, Alcohol in Excess of  1-2 drinks a day, Artificial Sweeteners, and "Sugar-free" Products. This will help patient to have stable blood glucose profile and potentially avoid unintended weight gain.  - I encouraged her to switch to  unprocessed or minimally processed complex starch and increased protein intake (animal or plant source), fruits, and vegetables.  - she is advised to stick to a routine mealtimes  to eat 3 meals  a day and avoid unnecessary snacks ( to snack only to correct hypoglycemia).   - she will be scheduled with Jearld Fenton, RDN, CDE for individualized DM education.  - I have approached her with the following individualized plan to manage diabetes and patient  agrees:   -   Based on her current glycemic profile, she will need basal/bolus insulin to treat diabetes to target.  - I approached her with this plan and she agrees. -  I  Will continue basal insulin  Toujeo  50 units daily at bedtime , initiated Humalog 10 units 3 times a day before meals for pre-meal blood glucose above 90 mg/dL (with additional correction for pre-meal blood glucose above 150 mg/dL) associated with strict monitoring of glucose 4 times a day-before meals and at bedtime. - She is asked to return in 2 weeks with her meter and logs for reevaluation. - Patient is warned not to take insulin without proper monitoring per orders. -Patient is encouraged to call clinic for blood glucose levels less than 70 or above 300 mg /dl. -  She is advised to continue her current supply of  Jardiance, and discontinue.  - She reports intolerance to metformin with GI side effects. - she will be considered for incretin therapy as appropriate next visit. - Patient specific target  A1c;  LDL, HDL, Triglycerides, and  Waist Circumference were discussed in detail.  2) BP/HTN:  controlled. Continue current medications including ACEI/ARB. 3) Lipids/HPL:   controlled with LDL at 68, triglycerides 222.   Patient is not on  Statins, will be considered for low-dose statins on subsequent visits.  4)  Weight/Diet: CDE Consult will be initiated , exercise, and detailed carbohydrates information provided. - Patient reports history of weighing  279 pounds in the past which led to a LAP-BAND weight loss surgery in 2010 which help her lose 117 pounds. - Subsequently she has regained almost all of that weight [name] to current weight of 275  pounds.  5) Chronic Care/Health Maintenance:  -she  is on ACEI/ARB  medications and  is encouraged to continue to follow up with Ophthalmology, Dentist,  Podiatrist at least yearly or according to recommendations, and advised to  stay away from smoking. I have recommended yearly flu vaccine and pneumonia vaccination at least every 5 years; moderate intensity exercise for up to 150 minutes weekly; and  sleep for at least 7 hours a day.  - Time spent with the patient: 25 min, of which >50% was spent in reviewing her sugar logs , discussing her hypo- and hyper-glycemic episodes, reviewing her current and  previous labs and insulin doses and developing a plan to avoid hypo- and hyper-glycemia.   - I advised patient to maintain close follow up with Monico Blitz, MD for primary care needs.  Follow up plan: - Return in about 2 weeks (around 07/10/2017) for follow up with meter and logs- no labs.  Glade Lloyd, MD Phone: [phone]  Fax: [phone]   06/26/2017, 9:33 AM This note was partially dictated with voice recognition software. Similar sounding words can be transcribed inadequately or may not  be corrected upon review.

## 2017-06-28 ENCOUNTER — Ambulatory Visit: Payer: 59 | Admitting: "Endocrinology

## 2017-07-12 ENCOUNTER — Encounter: Payer: Self-pay | Admitting: "Endocrinology

## 2017-07-12 ENCOUNTER — Ambulatory Visit (INDEPENDENT_AMBULATORY_CARE_PROVIDER_SITE_OTHER): Payer: 59 | Admitting: "Endocrinology

## 2017-07-12 VITALS — BP 130/85 | HR 78 | Ht 66.0 in | Wt 272.0 lb

## 2017-07-12 DIAGNOSIS — I10 Essential (primary) hypertension: Secondary | ICD-10-CM | POA: Diagnosis not present

## 2017-07-12 DIAGNOSIS — E1165 Type 2 diabetes mellitus with hyperglycemia: Secondary | ICD-10-CM

## 2017-07-12 MED ORDER — INSULIN LISPRO 100 UNIT/ML (KWIKPEN): 15.0000 [IU] | PEN_INJECTOR | Freq: Three times a day (TID) | SUBCUTANEOUS | 2 refills | Status: DC

## 2017-07-12 MED ORDER — DULAGLUTIDE 0.75 MG/0.5ML ~~LOC~~ SOAJ: 0.7500 mg | SUBCUTANEOUS | 2 refills | Status: DC

## 2017-07-12 MED ORDER — FREESTYLE LIBRE SENSOR SYSTEM MISC: 2 refills | Status: DC

## 2017-07-12 MED ORDER — FREESTYLE LIBRE READER DEVI: 1.0000 | Freq: Once | 0 refills | Status: AC

## 2017-07-12 NOTE — Patient Instructions (Signed)

## 2017-07-12 NOTE — Progress Notes (Signed)
Subjective:    Patient ID: [name] [name], female    DOB: 1965-11-05.  she is being seen in consultation for management of currently uncontrolled symptomatic diabetes requested by  Monico Blitz, MD.   Past Medical History:  Diagnosis Date  . Crohn's colitis ([place])   . Diverticulosis OCT 2011    PAN-COLONIC  . DM (diabetes mellitus) ([place])   . HTN (hypertension)   . Hyperlipemia   . 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 Obesity, Class III, BMI 40-49.9 (morbid obesity) ([place]) AUG 2010 305 LBS   MAY 2012 267  . Vitamin D deficiency    Past Surgical History:  Procedure Laterality Date  . ABDOMINAL EXPLORATION SURGERY  May 05, 2009 MM   INFLAMMED TI (SEROSITIS), "DIDN'T LOOK LIKE CROHN'S"  . CHOLECYSTECTOMY    . COLONOSCOPY  DEC 2010 WO  . COLONOSCOPY  OCT 2011   MILD ERYTHEMA IN TI (30-40 CM), NL SB/COLON Bx  . ESOPHAGOGASTRODUODENOSCOPY N/A 04/14/2014   Procedure: ESOPHAGOGASTRODUODENOSCOPY (EGD);  Surgeon: Danie Binder, MD;  Location: [facility];  Service: Endoscopy;  Laterality: N/A;  8:30 AM  . LAPAROSCOPIC GASTRIC BANDING  AUG 2010 BMI 50   GASTRIC POUCH 2-3 CM  . UMBILICAL HERNIA REPAIR  AUG 2010 MATTHEW MARTIN   INCARCERATED OMENTUM  . UPPER GASTROINTESTINAL ENDOSCOPY  MAY 2012 [facility]   GASTRITIS   Social History   Social History  . Marital status: Married    Spouse name: N/A  . Number of children: N/A  . Years of education: N/A   Social History Main Topics  . Smoking status: Never Smoker  . Smokeless tobacco: Never Used     Comment: Never smoked  . Alcohol use No  . Drug use: No  . Sexual activity: Not Asked   Other Topics Concern  . None   Social History Narrative   MARRIED, NO KIDS   Outpatient Encounter Prescriptions as of 07/12/2017  Medication Sig  . Blood Glucose Monitoring Suppl (ONETOUCH VERIO) w/Device KIT 1 each by Does not apply route as needed.  . Continuous Blood Gluc Receiver  (FREESTYLE LIBRE READER) DEVI 1 Piece by Does not apply route once.  . Continuous Blood Gluc Sensor (FREESTYLE LIBRE SENSOR SYSTEM) MISC Use one sensor every 10 days.  . Dulaglutide (TRULICITY) 5.46 TK/3.5WS SOPN Inject 0.75 mg into the skin once a week.  Marland Kitchen glucose blood (ONETOUCH VERIO) test strip Use as instructed  . insulin degludec (TRESIBA) 100 UNIT/ML SOPN FlexTouch Pen Inject 50 Units into the skin at bedtime.  . insulin lispro (HUMALOG KWIKPEN) 100 UNIT/ML KiwkPen Inject 0.15-0.21 mLs (15-21 Units total) into the skin 3 (three) times daily before meals.  . Lancets Thin MISC 1 each by Does not apply route 4 (four) times daily.  Marland Kitchen lisinopril (PRINIVIL,ZESTRIL) 40 MG tablet Take 40 mg by mouth daily.   Marland Kitchen losartan-hydrochlorothiazide (HYZAAR) 50-12.5 MG tablet Take 1 tablet by mouth daily.  . norgestimate-ethinyl estradiol (ORTHO-CYCLEN,SPRINTEC,PREVIFEM) 0.25-35 MG-MCG tablet Take 1 tablet by mouth daily.  Marland Kitchen omeprazole (PRILOSEC) 20 MG capsule take 1 capsule by mouth every morning 30 MINUTES PRIOR TO FIRST MEAL  . potassium chloride (K-DUR) 10 MEQ tablet Take 10 mEq by mouth daily.  Marland Kitchen torsemide (DEMADEX) 20 MG tablet Take 20 mg by mouth daily.  . [DISCONTINUED] insulin lispro (HUMALOG KWIKPEN) 100 UNIT/ML KiwkPen Inject 0.1-0.16 mLs (10-16 Units total) into the skin 3 (three) times  daily before meals.   No facility-administered encounter medications on file as of 07/12/2017.     ALLERGIES: No Known Allergies  VACCINATION STATUS:  There is no immunization history on file for this patient.  Diabetes  She presents for her follow-up diabetic visit. She has type 2 diabetes mellitus. Onset time: She was diagnosed at approximate age of 12 years. Her disease course has been improving. There are no hypoglycemic associated symptoms. Pertinent negatives for hypoglycemia include no confusion, headaches, pallor or seizures. Associated symptoms include fatigue, polydipsia and polyuria. Pertinent  negatives for diabetes include no chest pain and no polyphagia. There are no hypoglycemic complications. Symptoms are improving. There are no diabetic complications. Risk factors for coronary artery disease include diabetes mellitus, dyslipidemia, family history, obesity, hypertension and sedentary lifestyle. Current diabetic treatments: She is taking toujeo 50-75 units twice a day,jardiance 25 mg by mouth daily. Her weight is stable (She weighed maximum was 279 pounds before her lap band surgery 2010 with subsequent loss of 117 pounds. Unfortunately she has regained almost all of that weight [name] to current weight of 269 pounds.). She is following a generally unhealthy diet. When asked about meal planning, she reported none. She has not had a previous visit with a dietitian. She rarely participates in exercise. Her breakfast blood glucose range is generally 140-180 mg/dl. Her lunch blood glucose range is generally >200 mg/dl. Her dinner blood glucose range is generally >200 mg/dl. Her bedtime blood glucose range is generally >200 mg/dl. Her overall blood glucose range is >200 mg/dl. (Her average blood glucoses 2016 for the last 7 days of 22   readings.) An ACE inhibitor/angiotensin II receptor blocker is being taken.  Hypertension  This is a chronic problem. The current episode started more than 1 year ago. The problem is uncontrolled. Pertinent negatives include no chest pain, headaches, palpitations or shortness of breath. Risk factors for coronary artery disease include diabetes mellitus, obesity, sedentary lifestyle, family history and dyslipidemia. Past treatments include ACE inhibitors.     Review of Systems  Constitutional: Positive for fatigue. Negative for chills, fever and unexpected weight change.  HENT: Negative for trouble swallowing and voice change.   Eyes: Negative for visual disturbance.  Respiratory: Negative for cough, shortness of breath and wheezing.   Cardiovascular: Negative for  chest pain, palpitations and leg swelling.  Gastrointestinal: Negative for diarrhea, nausea and vomiting.  Endocrine: Positive for polydipsia and polyuria. Negative for cold intolerance, heat intolerance and polyphagia.  Musculoskeletal: Negative for arthralgias and myalgias.  Skin: Negative for color change, pallor, rash and wound.  Neurological: Negative for seizures and headaches.  Psychiatric/Behavioral: Negative for confusion and suicidal ideas.    Objective:    BP 130/85   Pulse 78   Ht _0  (1.676 m)   Wt 272 lb (123.4 kg)   BMI 43.90 kg/m   Wt Readings from Last 3 Encounters:  07/12/17 272 lb (123.4 kg)  06/26/17 275 lb (124.7 kg)  06/20/17 269 lb (122 kg)     Physical Exam  Constitutional: She is oriented to person, place, and time. She appears well-developed.  HENT:  Head: Normocephalic and atraumatic.  Eyes: EOM are normal.  Neck: Normal range of motion. Neck supple. No tracheal deviation present. No thyromegaly present.  Cardiovascular: Normal rate and regular rhythm.   Pulmonary/Chest: Effort normal and breath sounds normal.  Abdominal: Soft. Bowel sounds are normal. There is no tenderness. There is no guarding.  Musculoskeletal: Normal range of motion. She exhibits no edema.  Neurological: She is alert and oriented to person, place, and time. She has normal reflexes. No cranial nerve deficit. Coordination normal.  Skin: Skin is warm and dry. No rash noted. No erythema. No pallor.  Psychiatric: She has a normal mood and affect. Judgment normal.    CMP ( most recent) CMP     Component Value Date/Time   NA 136 07/18/2013 2307   K 3.3 (L) 07/18/2013 2307   CL 100 07/18/2013 2307   CO2 20 07/18/2013 2307   GLUCOSE 175 (H) 07/18/2013 2307   BUN 12 05/01/2017   CREATININE 0.8 05/01/2017   CREATININE 0.80 03/06/2014 0937   CALCIUM 9.1 07/18/2013 2307   PROT 7.1 07/17/2013 0701   ALBUMIN 3.1 (L) 07/17/2013 0701   AST 12 07/17/2013 0701   ALT 8 07/17/2013  0701   ALKPHOS 74 07/17/2013 0701   BILITOT 0.4 07/17/2013 0701   GFRNONAA >90 07/18/2013 2307   GFRAA >90 07/18/2013 2307     Diabetic Labs (most recent): Lab Results  Component Value Date   HGBA1C 11 05/23/2017   HGBA1C 10.1 (H) 07/17/2013   HGBA1C (H) 02/07/2011    10.1 (NOTE)                                                                       According to the ADA Clinical Practice Recommendations for 2011, when HbA1c is used as a screening test:   >=6.5%   Diagnostic of Diabetes Mellitus           (if abnormal result  is confirmed)  5.7-6.4%   Increased risk of developing Diabetes Mellitus  References:Diagnosis and Classification of Diabetes Mellitus,Diabetes OHYW,7371,06(YIRSW 1):S62-S69 and Standards of Medical Care in         Diabetes - 2011,Diabetes Care,2011,34  (Suppl 1):S11-S61.     Lipid Panel ( most recent) Lipid Panel     Component Value Date/Time   CHOL 158 07/17/2013 0701   TRIG 222 (H) 07/17/2013 0701   HDL 46 07/17/2013 0701   CHOLHDL 3.4 07/17/2013 0701   VLDL 44 (H) 07/17/2013 0701   LDLCALC 68 07/17/2013 0701    Assessment & Plan:   1. Uncontrolled type 2 diabetes mellitus with hyperglycemia ([place]) - Patient has currently uncontrolled symptomatic type 2 DM since  51 years of age. - She came with Controlled fasting but significantly above target was prandial blood glucose profile, averaging 216 , improving from 291 . - Her most recent A1c was 11%. Recent labs reviewed.  -her diabetes is complicated by obesity/sedentary life and Kitt G Stump remains at a high risk for more acute and chronic complications which include CAD, CVA, CKD, retinopathy, and neuropathy. These are all discussed in detail with the patient.  - I have counseled her on diet management and weight loss, by adopting a carbohydrate restricted/protein rich diet.  -  Suggestion is made for her to avoid simple carbohydrates  from her diet including Cakes, Sweet Desserts / Pastries, Ice  Cream, Soda (diet and regular), Sweet Tea, Candies, Chips, Cookies, Store Bought Juices, Alcohol in Excess of  1-2 drinks a day, Artificial Sweeteners, and "Sugar-free" Products. This will help patient to have stable blood glucose profile and potentially avoid unintended weight gain.   -  I encouraged her to switch to  unprocessed or minimally processed complex starch and increased protein intake (animal or plant source), fruits, and vegetables.  - she is advised to stick to a routine mealtimes to eat 3 meals  a day and avoid unnecessary snacks ( to snack only to correct hypoglycemia).   - I have approached her with the following individualized plan to manage diabetes and patient agrees:   -   Based on her current glycemic profile, she will continue to need basal/bolus insulin to treat diabetes to target.   -  I  will continue basal insulin  Tresiba  50 units daily at bedtime , increase Humalog to 15 units 3 times a day before meals for pre-meal blood glucose above 90 mg/dL (with additional correction for pre-meal blood glucose above 150 mg/dL) associated with strict monitoring of glucose 4 times a day-before meals and at bedtime. - She is asked to return in 2 weeks with her meter and logs for reevaluation. - Patient is warned not to take insulin without proper monitoring per orders. -Patient is encouraged to call clinic for blood glucose levels less than 70 or above 300 mg /dl. - She'll benefit from continuous glucose monitoring. I discussed and initiated a prescription for Freestyle libre device for her  - She reports intolerance to metformin with GI side effects. - I discussed and added weekly Trulicity 7.71 mg subcutaneously to advance as tolerated. - Patient specific target  A1c;  LDL, HDL, Triglycerides, and  Waist Circumference were discussed in detail.  2) BP/HTN:  Blood pressure is controlled.  I advised her to continue current medications including ACEI/ARB. 3) Lipids/HPL:   controlled  with LDL at 68, triglycerides 222.   Patient is not on  Statins, will be considered for low-dose statins on subsequent visits.  4)  Weight/Diet: CDE Consult has been initiated , exercise, and detailed carbohydrates information provided. - Patient reports history of weighing  279 pounds in the past which led to a LAP-BAND weight loss surgery in 2010 which help her lose 117 pounds. - Subsequently she has regained almost all of that weight [name] to current weight of 275 pounds.  5) Chronic Care/Health Maintenance:  -she  is on ACEI/ARB  medications and  is encouraged to continue to follow up with Ophthalmology, Dentist,  Podiatrist at least yearly or according to recommendations, and advised to  stay away from smoking. I have recommended yearly flu vaccine and pneumonia vaccination at least every 5 years; moderate intensity exercise for up to 150 minutes weekly; and  sleep for at least 7 hours a day.  - Time spent with the patient: 25 min, of which >50% was spent in reviewing her sugar logs , discussing her hypo- and hyper-glycemic episodes, reviewing her current and  previous labs and insulin doses and developing a plan to avoid hypo- and hyper-glycemia.     - I advised patient to maintain close follow up with Monico Blitz, MD for primary care needs.  Follow up plan: - Return in about 8 weeks (around 09/06/2017) for meter, and logs.  Glade Lloyd, MD Phone: [phone]  Fax: [phone]   07/12/2017, 11:07 AM This note was partially dictated with voice recognition software. Similar sounding words can be transcribed inadequately or may not  be corrected upon review.

## 2017-09-06 ENCOUNTER — Ambulatory Visit: Payer: 59 | Admitting: "Endocrinology

## 2017-09-09 LAB — LIPID PANEL
CHOLESTEROL: 134 mg/dL (ref <200)
HDL: 59 mg/dL (ref >50)
LDL CHOLESTEROL (CALC): 53 mg/dL
Non-HDL Cholesterol (Calc): 75 mg/dL (calc) (ref <130)
TRIGLYCERIDES: 132 mg/dL (ref <150)
Total CHOL/HDL Ratio: 2.3 (calc) (ref <5.0)

## 2017-09-09 LAB — COMPLETE METABOLIC PANEL WITH GFR
AG RATIO: 1.2 (calc) (ref 1.0–2.5)
ALT: 8 U/L (ref 6–29)
AST: 10 U/L (ref 10–35)
Albumin: 3.5 g/dL — ABNORMAL LOW (ref 3.6–5.1)
Alkaline phosphatase (APISO): 75 U/L (ref 33–130)
BILIRUBIN TOTAL: 0.3 mg/dL (ref 0.2–1.2)
BUN: 12 mg/dL (ref 7–25)
CALCIUM: 8.8 mg/dL (ref 8.6–10.4)
CHLORIDE: 105 mmol/L (ref 98–110)
CO2: 28 mmol/L (ref 20–32)
Creat: 0.67 mg/dL (ref 0.50–1.05)
GFR, Est African American: 118 mL/min/{1.73_m2} (ref >=60)
GFR, Est Non African American: 102 mL/min/{1.73_m2} (ref >=60)
GLUCOSE: 159 mg/dL — AB (ref 65–99)
Globulin: 2.9 g/dL (calc) (ref 1.9–3.7)
POTASSIUM: 4.1 mmol/L (ref 3.5–5.3)
Sodium: 142 mmol/L (ref 135–146)
TOTAL PROTEIN: 6.4 g/dL (ref 6.1–8.1)

## 2017-09-09 LAB — T4, FREE: Free T4: 1.2 ng/dL (ref 0.8–1.8)

## 2017-09-09 LAB — HEMOGLOBIN A1C
Hgb A1c MFr Bld: 7.4 % of total Hgb — ABNORMAL HIGH (ref <5.7)
Mean Plasma Glucose: 166 (calc)
eAG (mmol/L): 9.2 (calc)

## 2017-09-09 LAB — MICROALBUMIN / CREATININE URINE RATIO
CREATININE, URINE: 165 mg/dL (ref 20–275)
MICROALB UR: 2.2 mg/dL
Microalb Creat Ratio: 13 mcg/mg creat (ref <30)

## 2017-09-09 LAB — TSH: TSH: 4.29 mIU/L

## 2017-09-09 LAB — VITAMIN D 25 HYDROXY (VIT D DEFICIENCY, FRACTURES): VIT D 25 HYDROXY: 11 ng/mL — AB (ref 30–100)

## 2017-09-14 ENCOUNTER — Ambulatory Visit (INDEPENDENT_AMBULATORY_CARE_PROVIDER_SITE_OTHER): Payer: 59 | Admitting: "Endocrinology

## 2017-09-14 ENCOUNTER — Encounter: Payer: Self-pay | Admitting: "Endocrinology

## 2017-09-14 VITALS — BP 162/90 | HR 79 | Ht 66.0 in | Wt 272.0 lb

## 2017-09-14 DIAGNOSIS — E559 Vitamin D deficiency, unspecified: Secondary | ICD-10-CM | POA: Diagnosis not present

## 2017-09-14 DIAGNOSIS — E1165 Type 2 diabetes mellitus with hyperglycemia: Secondary | ICD-10-CM | POA: Diagnosis not present

## 2017-09-14 DIAGNOSIS — I10 Essential (primary) hypertension: Secondary | ICD-10-CM | POA: Diagnosis not present

## 2017-09-14 MED ORDER — DULAGLUTIDE 1.5 MG/0.5ML ~~LOC~~ SOAJ: 1.5000 mg | SUBCUTANEOUS | 3 refills | Status: DC

## 2017-09-14 MED ORDER — VITAMIN D (ERGOCALCIFEROL) 1.25 MG (50000 UNIT) PO CAPS: 50000.0000 [IU] | ORAL_CAPSULE | ORAL | 0 refills | Status: DC

## 2017-09-14 NOTE — Progress Notes (Signed)
Subjective:    Patient ID: [name] [name], female    DOB: [birth year].  she is being seen in consultation for management of currently uncontrolled symptomatic diabetes requested by  [name] Blitz, MD.   Past Medical History:  Diagnosis Date  . Crohn's colitis ([place])   . Diverticulosis OCT 2011    PAN-COLONIC  . DM (diabetes mellitus) ([place])   . HTN (hypertension)   . Hyperlipemia   . 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 Obesity, Class III, BMI 40-49.9 (morbid obesity) ([place]) AUG 2010 305 LBS   MAY 2012 267  . Vitamin D deficiency    Past Surgical History:  Procedure Laterality Date  . ABDOMINAL EXPLORATION SURGERY  May 05, 2009 MM   INFLAMMED TI (SEROSITIS), "DIDN'T LOOK LIKE CROHN'S"  . CHOLECYSTECTOMY    . COLONOSCOPY  DEC 2010 WO  . COLONOSCOPY  OCT 2011   MILD ERYTHEMA IN TI (30-40 CM), NL SB/COLON Bx  . ESOPHAGOGASTRODUODENOSCOPY N/A 04/14/2014   Procedure: ESOPHAGOGASTRODUODENOSCOPY (EGD);  Surgeon: [name] Binder, MD;  Location: [facility];  Service: Endoscopy;  Laterality: N/A;  8:30 AM  . LAPAROSCOPIC GASTRIC BANDING  AUG 2010 BMI 50   GASTRIC POUCH 2-3 CM  . UMBILICAL HERNIA REPAIR  AUG 2010 [name] [name]   INCARCERATED OMENTUM  . UPPER GASTROINTESTINAL ENDOSCOPY  MAY 2012 GIVENS CAPSULE PLACEMENT   GASTRITIS   Social History   Socioeconomic History  . Marital status: Married    Spouse name: None  . Number of children: None  . Years of education: None  . Highest education level: None  Social Needs  . Financial resource strain: None  . Food insecurity - worry: None  . Food insecurity - inability: None  . Transportation needs - medical: None  . Transportation needs - non-medical: None  Occupational History  . None  Tobacco Use  . Smoking status: Never Smoker  . Smokeless tobacco: Never Used  . Tobacco comment: Never smoked  Substance and Sexual Activity  . Alcohol use: No  . Drug use: No  . Sexual activity:  None  Other Topics Concern  . None  Social History Narrative   MARRIED, NO KIDS   Outpatient Encounter Medications as of 09/14/2017  Medication Sig  . lisinopril (PRINIVIL,ZESTRIL) 40 MG tablet Take 40 mg by mouth daily.  . Blood Glucose Monitoring Suppl (ONETOUCH VERIO) w/Device KIT 1 each by Does not apply route as needed.  . Continuous Blood Gluc Sensor (FREESTYLE LIBRE SENSOR SYSTEM) MISC Use one sensor every 10 days.  . Dulaglutide (TRULICITY) 1.5 VW/0.9WJ SOPN Inject 1.5 mg into the skin once a week.  Marland Kitchen glucose blood (ONETOUCH VERIO) test strip Use as instructed  . insulin degludec (TRESIBA) 100 UNIT/ML SOPN FlexTouch Pen Inject 50 Units into the skin at bedtime.  . insulin lispro (HUMALOG KWIKPEN) 100 UNIT/ML KiwkPen Inject 0.15-0.21 mLs (15-21 Units total) into the skin 3 (three) times daily before meals.  . Lancets Thin MISC 1 each by Does not apply route 4 (four) times daily.  Marland Kitchen lisinopril (PRINIVIL,ZESTRIL) 40 MG tablet Take 40 mg by mouth daily.   . norgestimate-ethinyl estradiol (ORTHO-CYCLEN,SPRINTEC,PREVIFEM) 0.25-35 MG-MCG tablet Take 1 tablet by mouth daily.  Marland Kitchen omeprazole (PRILOSEC) 20 MG capsule take 1 capsule by mouth every morning 30 MINUTES PRIOR TO FIRST MEAL  . potassium chloride (K-DUR) 10 MEQ tablet Take 10 mEq by mouth daily.  Marland Kitchen torsemide (DEMADEX) 20 MG  tablet Take 20 mg by mouth daily.  . Vitamin D, Ergocalciferol, (DRISDOL) 50000 units CAPS capsule Take 1 capsule (50,000 Units total) by mouth every 7 (seven) days.  . [DISCONTINUED] Dulaglutide (TRULICITY) 4.03 KV/4.2VZ SOPN Inject 0.75 mg into the skin once a week.  . [DISCONTINUED] losartan-hydrochlorothiazide (HYZAAR) 50-12.5 MG tablet Take 1 tablet by mouth daily.   No facility-administered encounter medications on file as of 09/14/2017.     ALLERGIES: No Known Allergies  VACCINATION STATUS:  There is no immunization history on file for this patient.  Diabetes  She presents for her follow-up  diabetic visit. She has type 2 diabetes mellitus. Onset time: She was diagnosed at approximate age of 63 years. Her disease course has been improving. There are no hypoglycemic associated symptoms. Pertinent negatives for hypoglycemia include no confusion, headaches, pallor or seizures. Associated symptoms include fatigue. Pertinent negatives for diabetes include no chest pain, no polydipsia, no polyphagia and no polyuria. There are no hypoglycemic complications. Symptoms are improving. There are no diabetic complications. Risk factors for coronary artery disease include diabetes mellitus, dyslipidemia, family history, obesity, hypertension and sedentary lifestyle. Current diabetic treatments: She is taking toujeo 50-75 units twice a day,jardiance 25 mg by mouth daily. Her weight is stable (She weighed maximum was 279 pounds before her lap band surgery 2010 with subsequent loss of 117 pounds. Unfortunately she has regained almost all of that weight [name] to current weight of 272 pounds.). She is following a generally unhealthy diet. When asked about meal planning, she reported none. She has not had a previous visit with a dietitian. She rarely participates in exercise. Her breakfast blood glucose range is generally 140-180 mg/dl. Her lunch blood glucose range is generally 140-180 mg/dl. Her dinner blood glucose range is generally 140-180 mg/dl. Her bedtime blood glucose range is generally 140-180 mg/dl. Her overall blood glucose range is 140-180 mg/dl. An ACE inhibitor/angiotensin II receptor blocker is being taken. Eye exam is current.  Hypertension  This is a chronic problem. The current episode started more than 1 year ago. The problem is uncontrolled. Pertinent negatives include no chest pain, headaches, palpitations or shortness of breath. Risk factors for coronary artery disease include diabetes mellitus, obesity, sedentary lifestyle, family history and dyslipidemia. Past treatments include ACE inhibitors.      Review of Systems  Constitutional: Positive for fatigue. Negative for chills, fever and unexpected weight change.  HENT: Negative for trouble swallowing and voice change.   Eyes: Negative for visual disturbance.  Respiratory: Negative for cough, shortness of breath and wheezing.   Cardiovascular: Negative for chest pain, palpitations and leg swelling.  Gastrointestinal: Negative for diarrhea, nausea and vomiting.  Endocrine: Negative for cold intolerance, heat intolerance, polydipsia, polyphagia and polyuria.  Musculoskeletal: Negative for arthralgias and myalgias.  Skin: Negative for color change, pallor, rash and wound.  Neurological: Negative for seizures and headaches.  Psychiatric/Behavioral: Negative for confusion and suicidal ideas.    Objective:    BP (!) 162/90   Pulse 79   Ht 5' 6" (1.676 m)   Wt 272 lb (123.4 kg)   BMI 43.90 kg/m   Wt Readings from Last 3 Encounters:  09/14/17 272 lb (123.4 kg)  07/12/17 272 lb (123.4 kg)  06/26/17 275 lb (124.7 kg)     Physical Exam  Constitutional: She is oriented to person, place, and time. She appears well-developed.  HENT:  Head: Normocephalic and atraumatic.  Eyes: EOM are normal.  Neck: Normal range of motion. Neck supple. No tracheal deviation  present. No thyromegaly present.  Cardiovascular: Normal rate and regular rhythm.  Pulmonary/Chest: Effort normal and breath sounds normal.  Abdominal: Soft. Bowel sounds are normal. There is no tenderness. There is no guarding.  Musculoskeletal: Normal range of motion. She exhibits no edema.  Neurological: She is alert and oriented to person, place, and time. She has normal reflexes. No cranial nerve deficit. Coordination normal.  Skin: Skin is warm and dry. No rash noted. No erythema. No pallor.  Psychiatric: She has a normal mood and affect. Judgment normal.    CMP ( most recent) CMP     Component Value Date/Time   NA 142 09/08/2017 0728   K 4.1 09/08/2017 0728   CL  105 09/08/2017 0728   CO2 28 09/08/2017 0728   GLUCOSE 159 (H) 09/08/2017 0728   BUN 12 09/08/2017 0728   BUN 12 05/01/2017   CREATININE 0.67 09/08/2017 0728   CALCIUM 8.8 09/08/2017 0728   PROT 6.4 09/08/2017 0728   ALBUMIN 3.1 (L) 07/17/2013 0701   AST 10 09/08/2017 0728   ALT 8 09/08/2017 0728   ALKPHOS 74 07/17/2013 0701   BILITOT 0.3 09/08/2017 0728   GFRNONAA 102 09/08/2017 0728   GFRAA 118 09/08/2017 0728     Diabetic Labs (most recent): Lab Results  Component Value Date   HGBA1C 7.4 (H) 09/08/2017   HGBA1C 11 05/23/2017   HGBA1C 10.1 (H) 07/17/2013     Lipid Panel ( most recent) Lipid Panel     Component Value Date/Time   CHOL 134 09/08/2017 0728   TRIG 132 09/08/2017 0728   HDL 59 09/08/2017 0728   CHOLHDL 2.3 09/08/2017 0728   VLDL 44 (H) 07/17/2013 0701   LDLCALC 68 07/17/2013 0701    Assessment & Plan:   1. Uncontrolled type 2 diabetes mellitus with hyperglycemia ([place]) - Patient has currently uncontrolled symptomatic type 2 DM since  51 years of age. - She came with better blood glucose profile control to near target. Her A1c is 7.4% improving from 11%.  -Recent labs reviewed.  -her diabetes is complicated by obesity/sedentary life and [place] Stump remains at a high risk for more acute and chronic complications which include CAD, CVA, CKD, retinopathy, and neuropathy. These are all discussed in detail with the patient.  - I have counseled her on diet management and weight loss, by adopting a carbohydrate restricted/protein rich diet.  -  Suggestion is made for her to avoid simple carbohydrates  from her diet including Cakes, Sweet Desserts / Pastries, Ice Cream, Soda (diet and regular), Sweet Tea, Candies, Chips, Cookies, Store Bought Juices, Alcohol in Excess of  1-2 drinks a day, Artificial Sweeteners, and "Sugar-free" Products. This will help patient to have stable blood glucose profile and potentially avoid unintended weight gain.  - I encouraged  her to switch to  unprocessed or minimally processed complex starch and increased protein intake (animal or plant source), fruits, and vegetables.  - she is advised to stick to a routine mealtimes to eat 3 meals  a day and avoid unnecessary snacks ( to snack only to correct hypoglycemia).   - I have approached her with the following individualized plan to manage diabetes and patient agrees:   -   Based on her current glycemic profile, she will continue to need basal/bolus insulin to treat diabetes to target.   -  I  will continue basal insulin  Tresiba  50 units daily at bedtime , continue Humalog  15 units 3 times a  day before meals for pre-meal blood glucose above 90 mg/dL (with additional correction for pre-meal blood glucose above 150 mg/dL) associated with strict monitoring of glucose 4 times a day-before meals and at bedtime. - Patient is warned not to take insulin without proper monitoring per orders. -Patient is encouraged to call clinic for blood glucose levels less than 70 or above 300 mg /dl. - She is benefiting from continuous glucose monitoring.  - She reports intolerance to metformin with GI side effects. - I  would increase her Trulicity to 1.5 mg subcutaneously to advance as tolerated. - Patient specific target  A1c;  LDL, HDL, Triglycerides, and  Waist Circumference were discussed in detail.  2) BP/HTN:  Blood pressure is uncontrolled.  I advised her to continue current medications including ACEI/ARB. 3) Lipids/HPL:   controlled with LDL at 53, triglycerides improving to 132 from  222.   Patient is not on  Statins, will be considered for low-dose statins on subsequent visits.  4)  Weight/Diet: CDE Consult has been initiated , exercise, and detailed carbohydrates information provided. - Patient reports history of weighing  279 pounds in the past which led to a LAP-BAND weight loss surgery in 2010 which help her lose 117 pounds. - Subsequently she has regained almost all of that  weight [name] to current weight of 272 pounds.  5) Chronic Care/Health Maintenance:  -she  is on ACEI/ARB  medications and  is encouraged to continue to follow up with Ophthalmology, Dentist,  Podiatrist at least yearly or according to recommendations, and advised to  stay away from smoking. I have recommended yearly flu vaccine and pneumonia vaccination at least every 5 years; moderate intensity exercise for up to 150 minutes weekly; and  sleep for at least 7 hours a day.  - Time spent with the patient: 25 min, of which >50% was spent in reviewing her sugar logs , discussing her hypo- and hyper-glycemic episodes, reviewing her current and  previous labs and insulin doses and developing a plan to avoid hypo- and hyper-glycemia.    - I advised patient to maintain close follow up with [name] Blitz, MD for primary care needs.  Follow up plan: - Return in about 3 months (around 12/13/2017) for meter, and logs.  Glade Lloyd, MD Phone: [phone]  Fax: [phone]   09/14/2017, 11:56 AM This note was partially dictated with voice recognition software. Similar sounding words can be transcribed inadequately or may not  be corrected upon review.

## 2017-09-14 NOTE — Patient Instructions (Signed)

## 2017-10-01 ENCOUNTER — Other Ambulatory Visit: Payer: Self-pay | Admitting: "Endocrinology

## 2017-10-05 ENCOUNTER — Other Ambulatory Visit: Payer: Self-pay | Admitting: "Endocrinology

## 2017-11-06 ENCOUNTER — Other Ambulatory Visit: Payer: Self-pay | Admitting: "Endocrinology

## 2017-11-23 ENCOUNTER — Other Ambulatory Visit: Payer: Self-pay | Admitting: "Endocrinology

## 2017-11-27 ENCOUNTER — Telehealth: Payer: Self-pay | Admitting: "Endocrinology

## 2017-11-27 ENCOUNTER — Encounter (HOSPITAL_COMMUNITY): Payer: Self-pay

## 2017-11-27 NOTE — Telephone Encounter (Signed)
Pt states she has had high BG readings since she started Prednisone on 11-24-17   Date Before breakfast Before lunch Before supper Bedtime  3/9 169 [phone]  3/10 144 [phone]  3/11 211             Pt taking: Tresiba 50 units qhs, Humalog 15-21 units TIDAC, Trulicity .75 mg weekly

## 2017-11-27 NOTE — Telephone Encounter (Signed)
Increase Humalog to 18 units TIDAC plus correction.

## 2017-11-27 NOTE — Telephone Encounter (Signed)
Pt notified by VM 

## 2017-12-14 LAB — COMPLETE METABOLIC PANEL WITH GFR
AG Ratio: 1.2 (calc) (ref 1.0–2.5)
ALBUMIN MSPROF: 3.7 g/dL (ref 3.6–5.1)
ALKALINE PHOSPHATASE (APISO): 79 U/L (ref 33–130)
ALT: 10 U/L (ref 6–29)
AST: 10 U/L (ref 10–35)
BILIRUBIN TOTAL: 0.5 mg/dL (ref 0.2–1.2)
BUN: 10 mg/dL (ref 7–25)
CHLORIDE: 103 mmol/L (ref 98–110)
CO2: 29 mmol/L (ref 20–32)
Calcium: 9.2 mg/dL (ref 8.6–10.4)
Creat: 0.75 mg/dL (ref 0.50–1.05)
GFR, EST AFRICAN AMERICAN: 106 mL/min/{1.73_m2} (ref >=60)
GFR, Est Non African American: 92 mL/min/{1.73_m2} (ref >=60)
GLUCOSE: 157 mg/dL — AB (ref 65–99)
Globulin: 3.1 g/dL (calc) (ref 1.9–3.7)
POTASSIUM: 4.6 mmol/L (ref 3.5–5.3)
Sodium: 137 mmol/L (ref 135–146)
Total Protein: 6.8 g/dL (ref 6.1–8.1)

## 2017-12-14 LAB — HEMOGLOBIN A1C
EAG (MMOL/L): 9.3 (calc)
Hgb A1c MFr Bld: 7.5 % of total Hgb — ABNORMAL HIGH (ref <5.7)
MEAN PLASMA GLUCOSE: 169 (calc)

## 2017-12-19 ENCOUNTER — Ambulatory Visit: Payer: 59 | Admitting: "Endocrinology

## 2017-12-20 ENCOUNTER — Encounter: Payer: Self-pay | Admitting: "Endocrinology

## 2017-12-20 ENCOUNTER — Ambulatory Visit (INDEPENDENT_AMBULATORY_CARE_PROVIDER_SITE_OTHER): Payer: 59 | Admitting: "Endocrinology

## 2017-12-20 VITALS — BP 130/86 | HR 87 | Ht 66.0 in | Wt 273.0 lb

## 2017-12-20 DIAGNOSIS — E1165 Type 2 diabetes mellitus with hyperglycemia: Secondary | ICD-10-CM | POA: Diagnosis not present

## 2017-12-20 DIAGNOSIS — I10 Essential (primary) hypertension: Secondary | ICD-10-CM

## 2017-12-20 MED ORDER — FREESTYLE LIBRE 14 DAY SENSOR MISC: 1.0000 | 2 refills | Status: DC

## 2017-12-20 MED ORDER — FREESTYLE LIBRE 14 DAY READER DEVI: 1.0000 | Freq: Once | 0 refills | Status: AC

## 2017-12-20 NOTE — Progress Notes (Signed)
Subjective:    Patient ID: [name] [name], female    DOB: 19-Aug-1966.  she is being seen in consultation for management of currently uncontrolled symptomatic diabetes requested by  Monico Blitz, MD.   Past Medical History:  Diagnosis Date  . Crohn's colitis ([place])   . Diverticulosis OCT 2011    PAN-COLONIC  . DM (diabetes mellitus) ([place])   . HTN (hypertension)   . Hyperlipemia   . 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 Obesity, Class III, BMI 40-49.9 (morbid obesity) ([place]) AUG 2010 305 LBS   MAY 2012 267  . Vitamin D deficiency    Past Surgical History:  Procedure Laterality Date  . ABDOMINAL EXPLORATION SURGERY  May 05, 2009 MM   INFLAMMED TI (SEROSITIS), "DIDN'T LOOK LIKE CROHN'S"  . CHOLECYSTECTOMY    . COLONOSCOPY  DEC 2010 WO  . COLONOSCOPY  OCT 2011   MILD ERYTHEMA IN TI (30-40 CM), NL SB/COLON Bx  . ESOPHAGOGASTRODUODENOSCOPY N/A 04/14/2014   Procedure: ESOPHAGOGASTRODUODENOSCOPY (EGD);  Surgeon: Danie Binder, MD;  Location: [facility];  Service: Endoscopy;  Laterality: N/A;  8:30 AM  . LAPAROSCOPIC GASTRIC BANDING  AUG 2010 BMI 50   GASTRIC POUCH 2-3 CM  . UMBILICAL HERNIA REPAIR  AUG 2010 MATTHEW MARTIN   INCARCERATED OMENTUM  . UPPER GASTROINTESTINAL ENDOSCOPY  MAY 2012 [facility] [employer]EMENT   GASTRITIS   Social History   Socioeconomic History  . Marital status: Married    Spouse name: Not on file  . Number of children: Not on file  . Years of education: Not on file  . Highest education level: Not on file  Occupational History  . Not on file  Social Needs  . Financial resource strain: Not on file  . Food insecurity:    Worry: Not on file    Inability: Not on file  . Transportation needs:    Medical: Not on file    Non-medical: Not on file  Tobacco Use  . Smoking status: Never Smoker  . Smokeless tobacco: Never Used  . Tobacco comment: Never smoked  Substance and Sexual Activity  . Alcohol use: No  .  Drug use: No  . Sexual activity: Not on file  Lifestyle  . Physical activity:    Days per week: Not on file    Minutes per session: Not on file  . Stress: Not on file  Relationships  . Social connections:    Talks on phone: Not on file    Gets together: Not on file    Attends religious service: Not on file    Active member of club or organization: Not on file    Attends meetings of clubs or organizations: Not on file    Relationship status: Not on file  Other Topics Concern  . Not on file  Social History Narrative   MARRIED, NO KIDS   Outpatient Encounter Medications as of 12/20/2017  Medication Sig  . Blood Glucose Monitoring Suppl (ONETOUCH VERIO) w/Device KIT 1 each by Does not apply route as needed.  . Continuous Blood Gluc Receiver (FREESTYLE LIBRE 14 DAY READER) DEVI 1 each by Does not apply route once for 1 dose.  . Continuous Blood Gluc Sensor (FREESTYLE LIBRE 14 DAY SENSOR) MISC Inject 1 each into the skin every 14 (fourteen) days. Use as directed.  . Continuous Blood Gluc Sensor (FREESTYLE LIBRE SENSOR SYSTEM) MISC USE 1 SENSOR EVERY 10 DAYS  .  Dulaglutide (TRULICITY) 1.5 KT/6.2BW SOPN Inject 1.5 mg into the skin once a week.  Marland Kitchen glucose blood (ONETOUCH VERIO) test strip Use as instructed  . insulin degludec (TRESIBA) 100 UNIT/ML SOPN FlexTouch Pen Inject 50 Units into the skin at bedtime.  . insulin lispro (HUMALOG KWIKPEN) 100 UNIT/ML KiwkPen Inject 0.15-0.21 mLs (15-21 Units total) into the skin 3 (three) times daily before meals.  . Lancets Thin MISC 1 each by Does not apply route 4 (four) times daily.  Marland Kitchen lisinopril (PRINIVIL,ZESTRIL) 40 MG tablet Take 40 mg by mouth daily.  . norgestimate-ethinyl estradiol (ORTHO-CYCLEN,SPRINTEC,PREVIFEM) 0.25-35 MG-MCG tablet Take 1 tablet by mouth daily.  Marland Kitchen omeprazole (PRILOSEC) 20 MG capsule take 1 capsule by mouth every morning 30 MINUTES PRIOR TO FIRST MEAL  . potassium chloride (K-DUR) 10 MEQ tablet Take 10 mEq by mouth daily.  Marland Kitchen  torsemide (DEMADEX) 20 MG tablet Take 20 mg by mouth daily.  . TRESIBA FLEXTOUCH 200 UNIT/ML SOPN inject 50 to 75 units subcutaneously once daily  . Vitamin D, Ergocalciferol, (DRISDOL) 50000 units CAPS capsule TAKE ONE CAPSULE BY MOUTH EVERY 7 DAYS  . [DISCONTINUED] insulin lispro (HUMALOG KWIKPEN) 100 UNIT/ML KiwkPen Inject 0.15-0.21 mLs (15-21 Units total) into the skin 3 (three) times daily.  . [DISCONTINUED] lisinopril (PRINIVIL,ZESTRIL) 40 MG tablet Take 40 mg by mouth daily.    No facility-administered encounter medications on file as of 12/20/2017.     ALLERGIES: No Known Allergies  VACCINATION STATUS:  There is no immunization history on file for this patient.  Diabetes  She presents for her follow-up diabetic visit. She has type 2 diabetes mellitus. Onset time: She was diagnosed at approximate age of 52 years. Her disease course has been stable. There are no hypoglycemic associated symptoms. Pertinent negatives for hypoglycemia include no confusion, headaches, pallor or seizures. Pertinent negatives for diabetes include no chest pain, no fatigue, no polydipsia, no polyphagia and no polyuria. There are no hypoglycemic complications. Symptoms are stable. There are no diabetic complications. Risk factors for coronary artery disease include diabetes mellitus, dyslipidemia, family history, obesity, hypertension and sedentary lifestyle. Current diabetic treatments: She is taking toujeo 50-75 units twice a day,jardiance 25 mg by mouth daily. Her weight is stable (She weighed maximum was 279 pounds before her lap band surgery 2010 with subsequent loss of 117 pounds. Unfortunately she has regained almost all of that weight [name] to current weight of 273 pounds.). She is following a generally unhealthy diet. When asked about meal planning, she reported none. She has not had a previous visit with a dietitian. She rarely participates in exercise. Her breakfast blood glucose range is generally 140-180  mg/dl. Her lunch blood glucose range is generally 140-180 mg/dl. Her dinner blood glucose range is generally 140-180 mg/dl. Her bedtime blood glucose range is generally 140-180 mg/dl. Her overall blood glucose range is 140-180 mg/dl. An ACE inhibitor/angiotensin II receptor blocker is being taken. Eye exam is current.  Hypertension  This is a chronic problem. The current episode started more than 1 year ago. The problem is controlled. Pertinent negatives include no chest pain, headaches, palpitations or shortness of breath. Risk factors for coronary artery disease include diabetes mellitus, obesity, sedentary lifestyle, family history and dyslipidemia. Past treatments include ACE inhibitors.    Review of Systems  Constitutional: Negative for chills, fatigue, fever and unexpected weight change.  HENT: Negative for trouble swallowing and voice change.   Eyes: Negative for visual disturbance.  Respiratory: Negative for cough, shortness of breath and wheezing.  Cardiovascular: Negative for chest pain, palpitations and leg swelling.  Gastrointestinal: Negative for diarrhea, nausea and vomiting.  Endocrine: Negative for cold intolerance, heat intolerance, polydipsia, polyphagia and polyuria.  Musculoskeletal: Negative for arthralgias and myalgias.  Skin: Negative for color change, pallor, rash and wound.  Neurological: Negative for seizures and headaches.  Psychiatric/Behavioral: Negative for confusion and suicidal ideas.    Objective:    BP 130/86   Pulse 87   Ht 5' 6"  (1.676 m)   Wt 273 lb (123.8 kg)   BMI 44.06 kg/m   Wt Readings from Last 3 Encounters:  12/20/17 273 lb (123.8 kg)  09/14/17 272 lb (123.4 kg)  07/12/17 272 lb (123.4 kg)     Physical Exam  Constitutional: She is oriented to person, place, and time. She appears well-developed.  HENT:  Head: Normocephalic and atraumatic.  Eyes: EOM are normal.  Neck: Normal range of motion. Neck supple. No tracheal deviation present.  No thyromegaly present.  Cardiovascular: Normal rate and regular rhythm.  Pulmonary/Chest: Effort normal and breath sounds normal.  Abdominal: Soft. Bowel sounds are normal. There is no tenderness. There is no guarding.  Musculoskeletal: Normal range of motion. She exhibits no edema.  Neurological: She is alert and oriented to person, place, and time. She has normal reflexes. No cranial nerve deficit. Coordination normal.  Skin: Skin is warm and dry. No rash noted. No erythema. No pallor.  Psychiatric: She has a normal mood and affect. Judgment normal.    CMP ( most recent) CMP     Component Value Date/Time   NA 137 12/13/2017 0747   K 4.6 12/13/2017 0747   CL 103 12/13/2017 0747   CO2 29 12/13/2017 0747   GLUCOSE 157 (H) 12/13/2017 0747   BUN 10 12/13/2017 0747   BUN 12 05/01/2017   CREATININE 0.75 12/13/2017 0747   CALCIUM 9.2 12/13/2017 0747   PROT 6.8 12/13/2017 0747   ALBUMIN 3.1 (L) 07/17/2013 0701   AST 10 12/13/2017 0747   ALT 10 12/13/2017 0747   ALKPHOS 74 07/17/2013 0701   BILITOT 0.5 12/13/2017 0747   GFRNONAA 92 12/13/2017 0747   GFRAA 106 12/13/2017 0747    Diabetic Labs (most recent): Lab Results  Component Value Date   HGBA1C 7.5 (H) 12/13/2017   HGBA1C 7.4 (H) 09/08/2017   HGBA1C 11 05/23/2017   Lipid Panel     Component Value Date/Time   CHOL 134 09/08/2017 0728   TRIG 132 09/08/2017 0728   HDL 59 09/08/2017 0728   CHOLHDL 2.3 09/08/2017 0728   VLDL 44 (H) 07/17/2013 0701   LDLCALC 53 09/08/2017 0728    Assessment & Plan:   1. Uncontrolled type 2 diabetes mellitus with hyperglycemia ([place]) - Patient has currently uncontrolled symptomatic type 2 DM since  52 years of age. - She came with better blood glucose profile control to near target. Her A1c is 7.5% improving from 11%.  -Recent labs reviewed.  -her diabetes is complicated by obesity/sedentary life and Pritika G Stump remains at a high risk for more acute and chronic complications which  include CAD, CVA, CKD, retinopathy, and neuropathy. These are all discussed in detail with the patient.  - I have counseled her on diet management and weight loss, by adopting a carbohydrate restricted/protein rich diet.  -  Suggestion is made for her to avoid simple carbohydrates  from her diet including Cakes, Sweet Desserts / Pastries, Ice Cream, Soda (diet and regular), Sweet Tea, Candies, Chips, Cookies, Store Bought Juices,  Alcohol in Excess of  1-2 drinks a day, Artificial Sweeteners, and "Sugar-free" Products. This will help patient to have stable blood glucose profile and potentially avoid unintended weight gain.  - I encouraged her to switch to  unprocessed or minimally processed complex starch and increased protein intake (animal or plant source), fruits, and vegetables.  - she is advised to stick to a routine mealtimes to eat 3 meals  a day and avoid unnecessary snacks ( to snack only to correct hypoglycemia).   - I have approached her with the following individualized plan to manage diabetes and patient agrees:   -   Based on her current glycemic profile, she will continue to need basal/bolus insulin to treat diabetes to target.   - I  will continue basal insulin Tresiba  50 units daily at bedtime , continue Humalog 15 units 3 times a day before meals for pre-meal blood glucose above 90 mg/dL (with additional correction for pre-meal blood glucose above 150 mg/dL) associated with documenting of glucose 4 times a day-before meals and at bedtime. - Patient is warned not to take insulin without proper monitoring per orders. -Patient is encouraged to call clinic for blood glucose levels less than 70 or above 300 mg /dl. - She is benefiting from continuous glucose monitoring.  - She reports intolerance to metformin with GI side effects. -I will proceed to increase her Trulicity to 1.5 mg subcutaneously weekly.    - Patient specific target  A1c;  LDL, HDL, Triglycerides, and  Waist  Circumference were discussed in detail.  2) BP/HTN: Her blood pressure is controlled to target.  I advised her to continue her current blood pressure medications including lisinopril.   3) Lipids/HPL:   controlled with LDL at 53, triglycerides improving to 132 from  222.   Patient is not on  Statins, will be considered for low-dose statins on subsequent visits.  4)  Weight/Diet: CDE Consult has been initiated , exercise, and detailed carbohydrates information provided. - Patient reports history of weighing  279 pounds in the past which led to a LAP-BAND weight loss surgery in 2010 which help her lose 117 pounds. - Subsequently she has regained almost all of that weight [name] to current weight of 273 pounds.  5) Chronic Care/Health Maintenance:  -she  is on ACEI/ARB  medications and  is encouraged to continue to follow up with Ophthalmology, Dentist,  Podiatrist at least yearly or according to recommendations, and advised to  stay away from smoking. I have recommended yearly flu vaccine and pneumonia vaccination at least every 5 years; moderate intensity exercise for up to 150 minutes weekly; and  sleep for at least 7 hours a day.  - I advised patient to maintain close follow up with Monico Blitz, MD for primary care needs.  - Time spent with the patient: 25 min, of which >50% was spent in reviewing her blood glucose logs , discussing her hypo- and hyper-glycemic episodes, reviewing her current and  previous labs and insulin doses and developing a plan to avoid hypo- and hyper-glycemia. Please refer to Patient Instructions for Blood Glucose Monitoring and Insulin/Medications Dosing Guide"  in media tab for additional information. Yamaira G Stump participated in the discussions, expressed understanding, and voiced agreement with the above plans.  All questions were answered to her satisfaction. she is encouraged to contact clinic should she have any questions or concerns prior to her return  visit.  Follow up plan: - Return in about 3 months (around 03/21/2018)  for meter, and logs.  Glade Lloyd, MD Phone: [phone]  Fax: [phone]   12/20/2017, 4:03 PM This note was partially dictated with voice recognition software. Similar sounding words can be transcribed inadequately or may not  be corrected upon review.

## 2017-12-20 NOTE — Patient Instructions (Signed)

## 2018-01-04 ENCOUNTER — Other Ambulatory Visit: Payer: Self-pay | Admitting: "Endocrinology

## 2018-02-06 ENCOUNTER — Other Ambulatory Visit: Payer: Self-pay | Admitting: "Endocrinology

## 2018-03-27 ENCOUNTER — Ambulatory Visit: Payer: 59 | Admitting: "Endocrinology

## 2018-04-01 ENCOUNTER — Other Ambulatory Visit: Payer: Self-pay | Admitting: "Endocrinology

## 2018-04-02 ENCOUNTER — Other Ambulatory Visit: Payer: Self-pay

## 2018-04-02 MED ORDER — DULAGLUTIDE 1.5 MG/0.5ML ~~LOC~~ SOAJ: 1.5000 mg | SUBCUTANEOUS | 3 refills | Status: DC

## 2018-04-11 ENCOUNTER — Other Ambulatory Visit: Payer: Self-pay | Admitting: "Endocrinology

## 2018-04-12 LAB — COMPLETE METABOLIC PANEL WITH GFR
AG RATIO: 1.3 (calc) (ref 1.0–2.5)
ALBUMIN MSPROF: 3.7 g/dL (ref 3.6–5.1)
ALT: 9 U/L (ref 6–29)
AST: 10 U/L (ref 10–35)
Alkaline phosphatase (APISO): 66 U/L (ref 33–130)
BILIRUBIN TOTAL: 0.4 mg/dL (ref 0.2–1.2)
BUN: 13 mg/dL (ref 7–25)
CALCIUM: 9 mg/dL (ref 8.6–10.4)
CO2: 26 mmol/L (ref 20–32)
Chloride: 104 mmol/L (ref 98–110)
Creat: 0.67 mg/dL (ref 0.50–1.05)
GFR, Est African American: 117 mL/min/{1.73_m2} (ref >=60)
GFR, Est Non African American: 101 mL/min/{1.73_m2} (ref >=60)
GLOBULIN: 2.8 g/dL (ref 1.9–3.7)
Glucose, Bld: 150 mg/dL — ABNORMAL HIGH (ref 65–99)
POTASSIUM: 4.3 mmol/L (ref 3.5–5.3)
Sodium: 137 mmol/L (ref 135–146)
TOTAL PROTEIN: 6.5 g/dL (ref 6.1–8.1)

## 2018-04-12 LAB — HEMOGLOBIN A1C
Hgb A1c MFr Bld: 7.5 % of total Hgb — ABNORMAL HIGH (ref <5.7)
Mean Plasma Glucose: 169 (calc)
eAG (mmol/L): 9.3 (calc)

## 2018-04-18 ENCOUNTER — Ambulatory Visit (INDEPENDENT_AMBULATORY_CARE_PROVIDER_SITE_OTHER): Payer: 59 | Admitting: "Endocrinology

## 2018-04-18 ENCOUNTER — Other Ambulatory Visit: Payer: Self-pay

## 2018-04-18 ENCOUNTER — Encounter: Payer: Self-pay | Admitting: "Endocrinology

## 2018-04-18 VITALS — BP 157/87 | HR 78 | Ht 66.0 in | Wt 288.0 lb

## 2018-04-18 DIAGNOSIS — E1165 Type 2 diabetes mellitus with hyperglycemia: Secondary | ICD-10-CM

## 2018-04-18 DIAGNOSIS — I10 Essential (primary) hypertension: Secondary | ICD-10-CM | POA: Diagnosis not present

## 2018-04-18 MED ORDER — INSULIN DEGLUDEC 200 UNIT/ML ~~LOC~~ SOPN: 60.0000 [IU] | PEN_INJECTOR | Freq: Every day | SUBCUTANEOUS | 2 refills | Status: DC

## 2018-04-18 NOTE — Progress Notes (Signed)
Endocrinology follow-up note  Subjective:    Patient ID: [name] [name], female    DOB: November 28, 1965.  she is being seen in follow-up for management of currently uncontrolled symptomatic diabetes requested by  Monico Blitz, MD.   Past Medical History:  Diagnosis Date  . Crohn's colitis ([place])   . Diverticulosis OCT 2011    PAN-COLONIC  . DM (diabetes mellitus) ([place])   . HTN (hypertension)   . Hyperlipemia   . 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 Obesity, Class III, BMI 40-49.9 (morbid obesity) ([place]) AUG 2010 305 LBS   MAY 2012 267  . Vitamin D deficiency    Past Surgical History:  Procedure Laterality Date  . ABDOMINAL EXPLORATION SURGERY  May 05, 2009 MM   INFLAMMED TI (SEROSITIS), "DIDN'T LOOK LIKE CROHN'S"  . CHOLECYSTECTOMY    . COLONOSCOPY  DEC 2010 WO  . COLONOSCOPY  OCT 2011   MILD ERYTHEMA IN TI (30-40 CM), NL SB/COLON Bx  . ESOPHAGOGASTRODUODENOSCOPY N/A 04/14/2014   Procedure: ESOPHAGOGASTRODUODENOSCOPY (EGD);  Surgeon: Danie Binder, MD;  Location: [facility];  Service: Endoscopy;  Laterality: N/A;  8:30 AM  . LAPAROSCOPIC GASTRIC BANDING  AUG 2010 BMI 50   GASTRIC POUCH 2-3 CM  . UMBILICAL HERNIA REPAIR  AUG 2010 MATTHEW MARTIN   INCARCERATED OMENTUM  . UPPER GASTROINTESTINAL ENDOSCOPY  MAY 2012 [facility] [employer]   GASTRITIS   Social History   Socioeconomic History  . Marital status: Married    Spouse name: Not on file  . Number of children: Not on file  . Years of education: Not on file  . Highest education level: Not on file  Occupational History  . Not on file  Social Needs  . Financial resource strain: Not on file  . Food insecurity:    Worry: Not on file    Inability: Not on file  . Transportation needs:    Medical: Not on file    Non-medical: Not on file  Tobacco Use  . Smoking status: Never Smoker  . Smokeless tobacco: Never Used  . Tobacco comment: Never smoked  Substance and Sexual Activity   . Alcohol use: No  . Drug use: No  . Sexual activity: Not on file  Lifestyle  . Physical activity:    Days per week: Not on file    Minutes per session: Not on file  . Stress: Not on file  Relationships  . Social connections:    Talks on phone: Not on file    Gets together: Not on file    Attends religious service: Not on file    Active member of club or organization: Not on file    Attends meetings of clubs or organizations: Not on file    Relationship status: Not on file  Other Topics Concern  . Not on file  Social History Narrative   MARRIED, NO KIDS   Outpatient Encounter Medications as of 04/18/2018  Medication Sig  . Blood Glucose Monitoring Suppl (ONETOUCH VERIO) w/Device KIT 1 each by Does not apply route as needed.  . Continuous Blood Gluc Sensor (FREESTYLE LIBRE 14 DAY SENSOR) MISC USE AS DIRECTED EVERY 14 DAYS  . Continuous Blood Gluc Sensor (FREESTYLE LIBRE SENSOR SYSTEM) MISC USE 1 SENSOR EVERY 10 DAYS  . Dulaglutide (TRULICITY) 1.5 OH/6.0VP SOPN Inject 1.5 mg into the skin once a week.  Marland Kitchen glucose blood (ONETOUCH VERIO) test strip Use as instructed  .  HUMALOG KWIKPEN 100 UNIT/ML KiwkPen INJECT 15 TO 21 UNITS INTO THE SKIN THREE TIMES DAILY  . Insulin Degludec (TRESIBA FLEXTOUCH) 200 UNIT/ML SOPN Inject 60 Units into the skin at bedtime.  . Lancets Thin MISC 1 each by Does not apply route 4 (four) times daily.  Marland Kitchen lisinopril (PRINIVIL,ZESTRIL) 40 MG tablet Take 40 mg by mouth daily.  . norgestimate-ethinyl estradiol (ORTHO-CYCLEN,SPRINTEC,PREVIFEM) 0.25-35 MG-MCG tablet Take 1 tablet by mouth daily.  Marland Kitchen omeprazole (PRILOSEC) 20 MG capsule take 1 capsule by mouth every morning 30 MINUTES PRIOR TO FIRST MEAL  . potassium chloride (K-DUR) 10 MEQ tablet Take 10 mEq by mouth daily.  Marland Kitchen torsemide (DEMADEX) 20 MG tablet Take 20 mg by mouth daily.  . Vitamin D, Ergocalciferol, (DRISDOL) 50000 units CAPS capsule TAKE ONE CAPSULE BY MOUTH EVERY 7 DAYS  . [DISCONTINUED] TRESIBA  FLEXTOUCH 200 UNIT/ML SOPN INJECT 50 TO 75 UNITS SUBCUTANEOUSLY ONCE DAILY  . amLODipine (NORVASC) 5 MG tablet 5 mg daily.  . diclofenac (VOLTAREN) 75 MG EC tablet TK 1 T PO BID WF  . [DISCONTINUED] insulin degludec (TRESIBA) 100 UNIT/ML SOPN FlexTouch Pen Inject 50 Units into the skin at bedtime.   No facility-administered encounter medications on file as of 04/18/2018.     ALLERGIES: No Known Allergies  VACCINATION STATUS:  There is no immunization history on file for this patient.  Diabetes  She presents for her follow-up diabetic visit. She has type 2 diabetes mellitus. Onset time: She was diagnosed at approximate age of 28 years. Her disease course has been stable. There are no hypoglycemic associated symptoms. Pertinent negatives for hypoglycemia include no confusion, headaches, pallor or seizures. Pertinent negatives for diabetes include no chest pain, no fatigue, no polydipsia, no polyphagia and no polyuria. There are no hypoglycemic complications. Symptoms are stable. There are no diabetic complications. Risk factors for coronary artery disease include diabetes mellitus, dyslipidemia, family history, obesity, hypertension and sedentary lifestyle. Current diabetic treatments: She is taking toujeo 50-75 units twice a day,jardiance 25 mg by mouth daily. Her weight is stable (She weighed maximum was 279 pounds before her lap band surgery 2010 with subsequent loss of 117 pounds. Unfortunately she has regained almost all of that weight back to current weight of 273 pounds.). She is following a generally unhealthy diet. When asked about meal planning, she reported none. She has not had a previous visit with a dietitian. She rarely participates in exercise. Her breakfast blood glucose range is generally 140-180 mg/dl. Her lunch blood glucose range is generally 140-180 mg/dl. Her dinner blood glucose range is generally 140-180 mg/dl. Her bedtime blood glucose range is generally 140-180 mg/dl. Her  overall blood glucose range is 140-180 mg/dl. An ACE inhibitor/angiotensin II receptor blocker is being taken. Eye exam is current.  Hypertension  This is a chronic problem. The current episode started more than 1 year ago. The problem is controlled. Pertinent negatives include no chest pain, headaches, palpitations or shortness of breath. Risk factors for coronary artery disease include diabetes mellitus, obesity, sedentary lifestyle, family history and dyslipidemia. Past treatments include ACE inhibitors.    Review of Systems  Constitutional: Negative for chills, fatigue, fever and unexpected weight change.  HENT: Negative for trouble swallowing and voice change.   Eyes: Negative for visual disturbance.  Respiratory: Negative for cough, shortness of breath and wheezing.   Cardiovascular: Negative for chest pain, palpitations and leg swelling.  Gastrointestinal: Negative for diarrhea, nausea and vomiting.  Endocrine: Negative for cold intolerance, heat intolerance, polydipsia, polyphagia  and polyuria.  Musculoskeletal: Negative for arthralgias and myalgias.  Skin: Negative for color change, pallor, rash and wound.  Neurological: Negative for seizures and headaches.  Psychiatric/Behavioral: Negative for confusion and suicidal ideas.    Objective:    BP (!) 157/87   Pulse 78   Ht 5' 6"  (1.676 m)   Wt 288 lb (130.6 kg)   SpO2 98%   BMI 46.48 kg/m   Wt Readings from Last 3 Encounters:  04/18/18 288 lb (130.6 kg)  12/20/17 273 lb (123.8 kg)  09/14/17 272 lb (123.4 kg)     Physical Exam  Constitutional: She is oriented to person, place, and time. She appears well-developed.  HENT:  Head: Normocephalic and atraumatic.  Eyes: EOM are normal.  Neck: Normal range of motion. Neck supple. No tracheal deviation present. No thyromegaly present.  Cardiovascular: Normal rate and regular rhythm.  Pulmonary/Chest: Effort normal and breath sounds normal.  Abdominal: Soft. Bowel sounds are  normal. There is no tenderness. There is no guarding.  Musculoskeletal: Normal range of motion. She exhibits no edema.  Neurological: She is alert and oriented to person, place, and time. She has normal reflexes. No cranial nerve deficit. Coordination normal.  Skin: Skin is warm and dry. No rash noted. No erythema. No pallor.  Psychiatric: She has a normal mood and affect. Judgment normal.    CMP ( most recent) CMP     Component Value Date/Time   NA 137 04/11/2018 0730   K 4.3 04/11/2018 0730   CL 104 04/11/2018 0730   CO2 26 04/11/2018 0730   GLUCOSE 150 (H) 04/11/2018 0730   BUN 13 04/11/2018 0730   BUN 12 05/01/2017   CREATININE 0.67 04/11/2018 0730   CALCIUM 9.0 04/11/2018 0730   PROT 6.5 04/11/2018 0730   ALBUMIN 3.1 (L) 07/17/2013 0701   AST 10 04/11/2018 0730   ALT 9 04/11/2018 0730   ALKPHOS 74 07/17/2013 0701   BILITOT 0.4 04/11/2018 0730   GFRNONAA 101 04/11/2018 0730   GFRAA 117 04/11/2018 0730    Diabetic Labs (most recent): Lab Results  Component Value Date   HGBA1C 7.5 (H) 04/11/2018   HGBA1C 7.5 (H) 12/13/2017   HGBA1C 7.4 (H) 09/08/2017   Lipid Panel     Component Value Date/Time   CHOL 134 09/08/2017 0728   TRIG 132 09/08/2017 0728   HDL 59 09/08/2017 0728   CHOLHDL 2.3 09/08/2017 0728   VLDL 44 (H) 07/17/2013 0701   LDLCALC 53 09/08/2017 0728    Assessment & Plan:   1. Uncontrolled type 2 diabetes mellitus with hyperglycemia ([place]) - Patient has currently uncontrolled symptomatic type 2 DM since  52 years of age. - She came with stable blood glucose profile and A1c of 7.5%, generally improving from 11%.    -Recent labs reviewed.  -her diabetes is complicated by obesity/sedentary life and Tonilynn G [name] remains at a high risk for more acute and chronic complications which include CAD, CVA, CKD, retinopathy, and neuropathy. These are all discussed in detail with the patient.  - I have counseled her on diet management and weight loss, by  adopting a carbohydrate restricted/protein rich diet.  -She still admits to dietary discretion including consumption of large quantities of soda.  -  Suggestion is made for her to avoid simple carbohydrates  from her diet including Cakes, Sweet Desserts / Pastries, Ice Cream, Soda (diet and regular), Sweet Tea, Candies, Chips, Cookies, Store Bought Juices, Alcohol in Excess of  1-2 drinks a  day, Artificial Sweeteners, and "Sugar-free" Products. This will help patient to have stable blood glucose profile and potentially avoid unintended weight gain.   - I encouraged her to switch to  unprocessed or minimally processed complex starch and increased protein intake (animal or plant source), fruits, and vegetables.  - she is advised to stick to a routine mealtimes to eat 3 meals  a day and avoid unnecessary snacks ( to snack only to correct hypoglycemia).   - I have approached her with the following individualized plan to manage diabetes and patient agrees:   -   Based on her current glycemic profile, she will continue to need basal/bolus insulin to treat diabetes to target.   - I I advised her to increase Tresiba to 60 units daily at bedtime , continue Humalog 15 units 3 times a day before meals for pre-meal blood glucose above 90 mg/dL (with additional correction for pre-meal blood glucose above 150 mg/dL) associated with documenting of glucose 4 times a day-before meals and at bedtime. - Patient is warned not to take insulin without proper monitoring per orders. -Patient is encouraged to call clinic for blood glucose levels less than 70 or above 300 mg /dl. - She is benefiting from continuous glucose monitoring.  - She reports intolerance to metformin with GI side effects. -I advised her to continue Trulicity 1.5 mg subcutaneously weekly.     - Patient specific target  A1c;  LDL, HDL, Triglycerides, and  Waist Circumference were discussed in detail.  2) BP/HTN: Her blood pressure is not  controlled to target. I advised her to continue her current blood pressure medications including lisinopril.    3) Lipids/HPL: Her recent lipid panel showed controlled LDL at 53, triglycerides improving to 132 from  222.   Patient is not on  Statins, will be considered for low-dose statins on subsequent visits.  4)  Weight/Diet: CDE Consult has been initiated , exercise, and detailed carbohydrates information provided. - Patient reports history of weighing  279 pounds in the past which led to a LAP-BAND weight loss surgery in 2010 which help her lose 117 pounds. - Subsequently she has regained almost all of that weight back to current weight of 288 pounds.  5) Chronic Care/Health Maintenance:  -she  is on ACEI/ARB  medications and  is encouraged to continue to follow up with Ophthalmology, Dentist,  Podiatrist at least yearly or according to recommendations, and advised to  stay away from smoking. I have recommended yearly flu vaccine and pneumonia vaccination at least every 5 years; moderate intensity exercise for up to 150 minutes weekly; and  sleep for at least 7 hours a day.  - I advised patient to maintain close follow up with Monico Blitz, MD for primary care needs.  - Time spent with the patient: 25 min, of which >50% was spent in reviewing her blood glucose logs , discussing her hypo- and hyper-glycemic episodes, reviewing her current and  previous labs and insulin doses and developing a plan to avoid hypo- and hyper-glycemia. Please refer to Patient Instructions for Blood Glucose Monitoring and Insulin/Medications Dosing Guide"  in media tab for additional information. [name] [name] participated in the discussions, expressed understanding, and voiced agreement with the above plans.  All questions were answered to her satisfaction. she is encouraged to contact clinic should she have any questions or concerns prior to her return visit.  Follow up plan: - Return in about 3 months (around  07/19/2018) for follow up with pre-visit  labs, meter, and logs.  Glade Lloyd, MD Phone: [phone]  Fax: [phone]   04/18/2018, 4:38 PM This note was partially dictated with voice recognition software. Similar sounding words can be transcribed inadequately or may not  be corrected upon review.

## 2018-04-18 NOTE — Patient Instructions (Signed)

## 2018-05-10 ENCOUNTER — Other Ambulatory Visit: Payer: Self-pay | Admitting: Orthopedic Surgery

## 2018-05-10 DIAGNOSIS — M25512 Pain in left shoulder: Secondary | ICD-10-CM

## 2018-05-18 ENCOUNTER — Ambulatory Visit
Admission: RE | Admit: 2018-05-18 | Discharge: 2018-05-18 | Disposition: A | Discharge status: Home / Self Care | Payer: 59 | Source: Ambulatory Visit | Attending: Orthopedic Surgery | Admitting: Orthopedic Surgery

## 2018-05-18 DIAGNOSIS — M25512 Pain in left shoulder: Secondary | ICD-10-CM

## 2018-07-05 ENCOUNTER — Other Ambulatory Visit: Payer: Self-pay | Admitting: "Endocrinology

## 2018-07-19 ENCOUNTER — Ambulatory Visit: Payer: 59 | Admitting: "Endocrinology

## 2018-07-21 ENCOUNTER — Other Ambulatory Visit: Payer: Self-pay | Admitting: "Endocrinology

## 2018-07-25 ENCOUNTER — Ambulatory Visit: Payer: 59 | Admitting: "Endocrinology

## 2018-08-05 ENCOUNTER — Other Ambulatory Visit: Payer: Self-pay | Admitting: "Endocrinology

## 2018-08-09 ENCOUNTER — Other Ambulatory Visit: Payer: Self-pay | Admitting: Orthopedic Surgery

## 2018-08-09 DIAGNOSIS — M25561 Pain in right knee: Secondary | ICD-10-CM

## 2018-08-19 ENCOUNTER — Ambulatory Visit
Admission: RE | Admit: 2018-08-19 | Discharge: 2018-08-19 | Disposition: A | Discharge status: Home / Self Care | Payer: 59 | Source: Ambulatory Visit | Attending: Orthopedic Surgery | Admitting: Orthopedic Surgery

## 2018-08-19 DIAGNOSIS — M25561 Pain in right knee: Secondary | ICD-10-CM

## 2018-09-27 LAB — HEMOGLOBIN A1C
Hgb A1c MFr Bld: 7.6 % of total Hgb — ABNORMAL HIGH (ref <5.7)
Mean Plasma Glucose: 171 (calc)
eAG (mmol/L): 9.5 (calc)

## 2018-09-27 LAB — COMPLETE METABOLIC PANEL WITH GFR
AG RATIO: 1.2 (calc) (ref 1.0–2.5)
ALBUMIN MSPROF: 3.7 g/dL (ref 3.6–5.1)
ALKALINE PHOSPHATASE (APISO): 74 U/L (ref 33–130)
ALT: 12 U/L (ref 6–29)
AST: 10 U/L (ref 10–35)
BUN: 13 mg/dL (ref 7–25)
CHLORIDE: 105 mmol/L (ref 98–110)
CO2: 27 mmol/L (ref 20–32)
Calcium: 9.2 mg/dL (ref 8.6–10.4)
Creat: 0.71 mg/dL (ref 0.50–1.05)
GFR, EST AFRICAN AMERICAN: 113 mL/min/{1.73_m2} (ref >=60)
GFR, Est Non African American: 98 mL/min/{1.73_m2} (ref >=60)
GLUCOSE: 152 mg/dL — AB (ref 65–99)
Globulin: 3.1 g/dL (calc) (ref 1.9–3.7)
POTASSIUM: 5.1 mmol/L (ref 3.5–5.3)
Sodium: 140 mmol/L (ref 135–146)
TOTAL PROTEIN: 6.8 g/dL (ref 6.1–8.1)
Total Bilirubin: 0.4 mg/dL (ref 0.2–1.2)

## 2018-09-27 LAB — LIPID PANEL
Cholesterol: 153 mg/dL (ref <200)
HDL: 59 mg/dL (ref >50)
LDL Cholesterol (Calc): 73 mg/dL (calc)
Non-HDL Cholesterol (Calc): 94 mg/dL (calc) (ref <130)
Total CHOL/HDL Ratio: 2.6 (calc) (ref <5.0)
Triglycerides: 124 mg/dL (ref <150)

## 2018-09-27 LAB — MICROALBUMIN / CREATININE URINE RATIO
Creatinine, Urine: 212 mg/dL (ref 20–275)
MICROALB/CREAT RATIO: 4 ug/mg{creat} (ref <30)
Microalb, Ur: 0.8 mg/dL

## 2018-09-27 LAB — VITAMIN D 25 HYDROXY (VIT D DEFICIENCY, FRACTURES): Vit D, 25-Hydroxy: 37 ng/mL (ref 30–100)

## 2018-09-27 LAB — TSH: TSH: 3.26 mIU/L

## 2018-09-27 LAB — T4, FREE: FREE T4: 1.1 ng/dL (ref 0.8–1.8)

## 2018-10-04 ENCOUNTER — Encounter: Payer: Self-pay | Admitting: "Endocrinology

## 2018-10-04 ENCOUNTER — Ambulatory Visit (INDEPENDENT_AMBULATORY_CARE_PROVIDER_SITE_OTHER): Payer: 59 | Admitting: "Endocrinology

## 2018-10-04 VITALS — BP 142/80 | HR 74 | Ht 66.0 in | Wt 313.0 lb

## 2018-10-04 DIAGNOSIS — I10 Essential (primary) hypertension: Secondary | ICD-10-CM | POA: Diagnosis not present

## 2018-10-04 DIAGNOSIS — E1165 Type 2 diabetes mellitus with hyperglycemia: Secondary | ICD-10-CM | POA: Diagnosis not present

## 2018-10-04 DIAGNOSIS — E559 Vitamin D deficiency, unspecified: Secondary | ICD-10-CM

## 2018-10-04 MED ORDER — INSULIN LISPRO (1 UNIT DIAL) 100 UNIT/ML (KWIKPEN): 18.0000 [IU] | PEN_INJECTOR | Freq: Three times a day (TID) | SUBCUTANEOUS | 2 refills | Status: DC

## 2018-10-04 MED ORDER — INSULIN PEN NEEDLE 31G X 8 MM MISC: 1.0000 | 1 refills | Status: DC

## 2018-10-04 NOTE — Patient Instructions (Signed)

## 2018-10-04 NOTE — Progress Notes (Signed)
Endocrinology follow-up note  Subjective:    Patient ID: [name] [name], female    DOB: Apr 11, 1966.  she is being seen in follow-up for management of currently uncontrolled symptomatic diabetes requested by  Monico Blitz, MD.   Past Medical History:  Diagnosis Date  . Crohn's colitis ([place])   . Diverticulosis OCT 2011    PAN-COLONIC  . DM (diabetes mellitus) ([place])   . HTN (hypertension)   . Hyperlipemia   . 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 Obesity, Class III, BMI 40-49.9 (morbid obesity) ([place]) AUG 2010 305 LBS   MAY 2012 267  . Vitamin D deficiency    Past Surgical History:  Procedure Laterality Date  . ABDOMINAL EXPLORATION SURGERY  May 05, 2009 MM   INFLAMMED TI (SEROSITIS), "DIDN'T LOOK LIKE CROHN'S"  . CHOLECYSTECTOMY    . COLONOSCOPY  DEC 2010 WO  . COLONOSCOPY  OCT 2011   MILD ERYTHEMA IN TI (30-40 CM), NL SB/COLON Bx  . ESOPHAGOGASTRODUODENOSCOPY N/A 04/14/2014   Procedure: ESOPHAGOGASTRODUODENOSCOPY (EGD);  Surgeon: Danie Binder, MD;  Location: [facility];  Service: Endoscopy;  Laterality: N/A;  8:30 AM  . LAPAROSCOPIC GASTRIC BANDING  AUG 2010 BMI 50   GASTRIC POUCH 2-3 CM  . UMBILICAL HERNIA REPAIR  AUG 2010 MATTHEW MARTIN   INCARCERATED OMENTUM  . UPPER GASTROINTESTINAL ENDOSCOPY  MAY 2012 [facility] [employer]   GASTRITIS   Social History   Socioeconomic History  . Marital status: Married    Spouse name: Not on file  . Number of children: Not on file  . Years of education: Not on file  . Highest education level: Not on file  Occupational History  . Not on file  Social Needs  . Financial resource strain: Not on file  . Food insecurity:    Worry: Not on file    Inability: Not on file  . Transportation needs:    Medical: Not on file    Non-medical: Not on file  Tobacco Use  . Smoking status: Never Smoker  . Smokeless tobacco: Never Used  . Tobacco comment: Never smoked  Substance and Sexual Activity   . Alcohol use: No  . Drug use: No  . Sexual activity: Not on file  Lifestyle  . Physical activity:    Days per week: Not on file    Minutes per session: Not on file  . Stress: Not on file  Relationships  . Social connections:    Talks on phone: Not on file    Gets together: Not on file    Attends religious service: Not on file    Active member of club or organization: Not on file    Attends meetings of clubs or organizations: Not on file    Relationship status: Not on file  Other Topics Concern  . Not on file  Social History Narrative   MARRIED, NO KIDS   Outpatient Encounter Medications as of 10/04/2018  Medication Sig  . amLODipine (NORVASC) 5 MG tablet 5 mg daily.  . Blood Glucose Monitoring Suppl (ONETOUCH VERIO) w/Device KIT 1 each by Does not apply route as needed.  . Continuous Blood Gluc Sensor (FREESTYLE LIBRE 14 DAY SENSOR) MISC USE AS DIRECTED EVERY 14 DAYS  . Continuous Blood Gluc Sensor (FREESTYLE LIBRE SENSOR SYSTEM) MISC USE 1 SENSOR EVERY 10 DAYS  . diclofenac (VOLTAREN) 75 MG EC tablet TK 1 T PO BID WF  . glucose blood (  ONETOUCH VERIO) test strip Use as instructed  . insulin lispro (HUMALOG KWIKPEN) 100 UNIT/ML KwikPen Inject 0.18-0.24 mLs (18-24 Units total) into the skin 3 (three) times daily before meals.  . Insulin Pen Needle (B-D ULTRAFINE III SHORT PEN) 31G X 8 MM MISC 1 each by Does not apply route as directed.  . Lancets Thin MISC 1 each by Does not apply route 4 (four) times daily.  Marland Kitchen lisinopril (PRINIVIL,ZESTRIL) 40 MG tablet Take 40 mg by mouth daily.  . norgestimate-ethinyl estradiol (ORTHO-CYCLEN,SPRINTEC,PREVIFEM) 0.25-35 MG-MCG tablet Take 1 tablet by mouth daily.  Marland Kitchen omeprazole (PRILOSEC) 20 MG capsule take 1 capsule by mouth every morning 30 MINUTES PRIOR TO FIRST MEAL  . potassium chloride (K-DUR) 10 MEQ tablet Take 10 mEq by mouth daily.  Marland Kitchen torsemide (DEMADEX) 20 MG tablet Take 20 mg by mouth daily.  . TRESIBA FLEXTOUCH 200 UNIT/ML SOPN INJECT  60 UNITS INTO THE SKIN AT BEDTIME  . Vitamin D, Ergocalciferol, (DRISDOL) 50000 units CAPS capsule TAKE ONE CAPSULE BY MOUTH EVERY 7 DAYS  . [DISCONTINUED] Dulaglutide (TRULICITY) 1.5 QQ/2.2LN SOPN Inject 1.5 mg into the skin once a week. (Patient not taking: Reported on 10/04/2018)  . [DISCONTINUED] HUMALOG KWIKPEN 100 UNIT/ML KiwkPen ADMINISTER 15 TO 21 UNITS UNDER THE SKIN THREE TIMES DAILY   No facility-administered encounter medications on file as of 10/04/2018.     ALLERGIES: No Known Allergies  VACCINATION STATUS:  There is no immunization history on file for this patient.  Diabetes  She presents for her follow-up diabetic visit. She has type 2 diabetes mellitus. Onset time: She was diagnosed at approximate age of 29 years. Her disease course has been stable. There are no hypoglycemic associated symptoms. Pertinent negatives for hypoglycemia include no confusion, headaches, pallor or seizures. Pertinent negatives for diabetes include no chest pain, no fatigue, no polydipsia, no polyphagia and no polyuria. There are no hypoglycemic complications. Symptoms are stable. There are no diabetic complications. Risk factors for coronary artery disease include diabetes mellitus, dyslipidemia, family history, obesity, hypertension and sedentary lifestyle. Current diabetic treatments: She is taking toujeo 50-75 units twice a day,jardiance 25 mg by mouth daily. Her weight is increasing steadily (She weighed maximum was 279 pounds before her lap band surgery 2010 with subsequent loss of 117 pounds. Unfortunately she has regained almost all of that weight back to current weight of 313 pounds.). She is following a generally unhealthy diet. When asked about meal planning, she reported none. She has not had a previous visit with a dietitian. She rarely participates in exercise. Her breakfast blood glucose range is generally 140-180 mg/dl. Her lunch blood glucose range is generally 140-180 mg/dl. Her dinner blood  glucose range is generally 140-180 mg/dl. Her bedtime blood glucose range is generally 140-180 mg/dl. Her overall blood glucose range is 140-180 mg/dl. An ACE inhibitor/angiotensin II receptor blocker is being taken. Eye exam is current.  Hypertension  This is a chronic problem. The current episode started more than 1 year ago. The problem is controlled. Pertinent negatives include no chest pain, headaches, palpitations or shortness of breath. Risk factors for coronary artery disease include diabetes mellitus, obesity, sedentary lifestyle, family history and dyslipidemia. Past treatments include ACE inhibitors.    Review of Systems  Constitutional: Negative for chills, fatigue, fever and unexpected weight change.  HENT: Negative for trouble swallowing and voice change.   Eyes: Negative for visual disturbance.  Respiratory: Negative for cough, shortness of breath and wheezing.   Cardiovascular: Negative for chest pain, palpitations  and leg swelling.  Gastrointestinal: Negative for diarrhea, nausea and vomiting.  Endocrine: Negative for cold intolerance, heat intolerance, polydipsia, polyphagia and polyuria.  Musculoskeletal: Negative for arthralgias and myalgias.  Skin: Negative for color change, pallor, rash and wound.  Neurological: Negative for seizures and headaches.  Psychiatric/Behavioral: Negative for confusion and suicidal ideas.    Objective:    BP (!) 142/80   Pulse 74   Ht 5' 6"  (1.676 m)   Wt (!) 313 lb (142 kg)   BMI 50.52 kg/m   Wt Readings from Last 3 Encounters:  10/04/18 (!) 313 lb (142 kg)  04/18/18 288 lb (130.6 kg)  12/20/17 273 lb (123.8 kg)     Physical Exam  Constitutional: She is oriented to person, place, and time. She appears well-developed.  HENT:  Head: Normocephalic and atraumatic.  Eyes: EOM are normal.  Neck: Normal range of motion. Neck supple. No tracheal deviation present. No thyromegaly present.  Cardiovascular: Normal rate and regular rhythm.   Pulmonary/Chest: Effort normal and breath sounds normal.  Abdominal: Soft. Bowel sounds are normal. There is no abdominal tenderness. There is no guarding.  Musculoskeletal: Normal range of motion.        General: No edema.  Neurological: She is alert and oriented to person, place, and time. She has normal reflexes. No cranial nerve deficit. Coordination normal.  Skin: Skin is warm and dry. No rash noted. No erythema. No pallor.  Psychiatric: She has a normal mood and affect. Judgment normal.    CMP ( most recent) CMP     Component Value Date/Time   NA 140 09/26/2018 0733   K 5.1 09/26/2018 0733   CL 105 09/26/2018 0733   CO2 27 09/26/2018 0733   GLUCOSE 152 (H) 09/26/2018 0733   BUN 13 09/26/2018 0733   BUN 12 05/01/2017   CREATININE 0.71 09/26/2018 0733   CALCIUM 9.2 09/26/2018 0733   PROT 6.8 09/26/2018 0733   ALBUMIN 3.1 (L) 07/17/2013 0701   AST 10 09/26/2018 0733   ALT 12 09/26/2018 0733   ALKPHOS 74 07/17/2013 0701   BILITOT 0.4 09/26/2018 0733   GFRNONAA 98 09/26/2018 0733   GFRAA 113 09/26/2018 0733    Diabetic Labs (most recent): Lab Results  Component Value Date   HGBA1C 7.6 (H) 09/26/2018   HGBA1C 7.5 (H) 04/11/2018   HGBA1C 7.5 (H) 12/13/2017   Lipid Panel     Component Value Date/Time   CHOL 153 09/26/2018 0733   TRIG 124 09/26/2018 0733   HDL 59 09/26/2018 0733   CHOLHDL 2.6 09/26/2018 0733   VLDL 44 (H) 07/17/2013 0701   LDLCALC 73 09/26/2018 0733    Assessment & Plan:   1. Uncontrolled type 2 diabetes mellitus with hyperglycemia ([place]) - Patient has currently uncontrolled symptomatic type 2 DM since  53 years of age. - She came with stable blood glucose profile and A1c of 7.6%, generally improving from 11%.    -Recent labs reviewed.  -her diabetes is complicated by obesity/sedentary life and Gerilynn G Stump remains at a high risk for more acute and chronic complications which include CAD, CVA, CKD, retinopathy, and neuropathy. These are all  discussed in detail with the patient.  - I have counseled her on diet management and weight loss, by adopting a carbohydrate restricted/protein rich diet.  -She still admits to dietary discretion including consumption of large quantities of soda.  -  Suggestion is made for her to avoid simple carbohydrates  from her diet including Cakes,  Sweet Desserts / Pastries, Ice Cream, Soda (diet and regular), Sweet Tea, Candies, Chips, Cookies, Store Bought Juices, Alcohol in Excess of  1-2 drinks a day, Artificial Sweeteners, and "Sugar-free" Products. This will help patient to have stable blood glucose profile and potentially avoid unintended weight gain.  - I encouraged her to switch to  unprocessed or minimally processed complex starch and increased protein intake (animal or plant source), fruits, and vegetables.  - she is advised to stick to a routine mealtimes to eat 3 meals  a day and avoid unnecessary snacks ( to snack only to correct hypoglycemia).   - I have approached her with the following individualized plan to manage diabetes and patient agrees:   -   Based on her current glycemic profile, she will continue to need basal/bolus insulin to treat diabetes to target.   -She is advised to continue Tresiba 60 units daily at bedtime, increase Humalog to 18 units 3 times a day before meals for pre-meal blood glucose above 90 mg/dL (with additional correction for pre-meal blood glucose above 150 mg/dL) associated with documenting of glucose 4 times a day-before meals and at bedtime. - Patient is warned not to take insulin without proper monitoring per orders. -Patient is encouraged to call clinic for blood glucose levels less than 70 or above 300 mg /dl. - She is benefiting from continuous glucose monitoring.  - She reports intolerance to metformin with GI side effects. -She did not tolerate Trulicity which was stopped since her last visit.    - Patient specific target  A1c;  LDL, HDL,  Triglycerides, and  Waist Circumference were discussed in detail.  2) BP/HTN: Her blood pressure is not controlled to target.  She is advised to continue her current blood pressure medications including lisinopril.    3) Lipids/HPL: Her recent lipid panel showed controlled LDL at 53, triglycerides improving to 132 from  222.   Patient is not on  Statins, will be considered for low-dose statins on subsequent visits.  4)  Weight/Diet: CDE Consult has been initiated , exercise, and detailed carbohydrates information provided. - Patient reports history of weighing  279 pounds in the past which led to a LAP-BAND weight loss surgery in 2010 which help her lose 117 pounds. - Subsequently she has regained almost all of that weight back to current weight of 313 pounds.  Due to this rapid weight gain, she will be reconsidered for low-dose Trulicity on subsequent visits.  5) Chronic Care/Health Maintenance:  -she  is on ACEI/ARB  medications and  is encouraged to continue to follow up with Ophthalmology, Dentist,  Podiatrist at least yearly or according to recommendations, and advised to  stay away from smoking. I have recommended yearly flu vaccine and pneumonia vaccination at least every 5 years; moderate intensity exercise for up to 150 minutes weekly; and  sleep for at least 7 hours a day.  - I advised patient to maintain close follow up with Monico Blitz, MD for primary care needs.  - Time spent with the patient: 25 min, of which >50% was spent in reviewing her blood glucose logs , discussing her hypo- and hyper-glycemic episodes, reviewing her current and  previous labs and insulin doses and developing a plan to avoid hypo- and hyper-glycemia. Please refer to Patient Instructions for Blood Glucose Monitoring and Insulin/Medications Dosing Guide"  in media tab for additional information. Solita G Stump participated in the discussions, expressed understanding, and voiced agreement with the above plans.   All  questions were answered to her satisfaction. she is encouraged to contact clinic should she have any questions or concerns prior to her return visit.  Follow up plan: - Return in about 3 months (around 01/03/2019) for Meter, and Logs.  Glade Lloyd, MD Phone: [phone]  Fax: [phone]   10/04/2018, 3:51 PM This note was partially dictated with voice recognition software. Similar sounding words can be transcribed inadequately or may not  be corrected upon review.

## 2018-10-04 NOTE — Progress Notes (Signed)
Patient is using the 14 day freestyle libre to test BG.

## 2018-10-18 ENCOUNTER — Other Ambulatory Visit: Payer: Self-pay | Admitting: "Endocrinology

## 2018-11-06 ENCOUNTER — Telehealth: Payer: Self-pay

## 2018-11-06 NOTE — Telephone Encounter (Signed)
I  will examine her during her next visit.

## 2018-11-06 NOTE — Telephone Encounter (Signed)
[name] [name] is calling stating for the last 2 months the bottom of both feet is going numb towards her toes, she went to see her Primary Care Dr. And they informed her to contact her Endocrinologist that it sounds like its coming from her diabetes, Please advise?

## 2018-11-20 NOTE — Telephone Encounter (Signed)
Noted  

## 2019-01-08 ENCOUNTER — Ambulatory Visit: Payer: 59 | Admitting: "Endocrinology

## 2019-01-16 ENCOUNTER — Other Ambulatory Visit: Payer: Self-pay | Admitting: "Endocrinology

## 2019-01-30 ENCOUNTER — Other Ambulatory Visit: Payer: Self-pay | Admitting: "Endocrinology

## 2019-02-14 ENCOUNTER — Ambulatory Visit: Payer: 59 | Admitting: "Endocrinology

## 2019-03-02 LAB — HEMOGLOBIN A1C
EAG (MMOL/L): 9.7 (calc)
HEMOGLOBIN A1C: 7.7 %{Hb} — AB (ref <5.7)
MEAN PLASMA GLUCOSE: 174 (calc)

## 2019-03-02 LAB — COMPLETE METABOLIC PANEL WITH GFR
AG Ratio: 1.3 (calc) (ref 1.0–2.5)
ALT: 16 U/L (ref 6–29)
AST: 14 U/L (ref 10–35)
Albumin: 3.9 g/dL (ref 3.6–5.1)
Alkaline phosphatase (APISO): 93 U/L (ref 37–153)
BUN: 18 mg/dL (ref 7–25)
CO2: 28 mmol/L (ref 20–32)
Calcium: 9.4 mg/dL (ref 8.6–10.4)
Chloride: 103 mmol/L (ref 98–110)
Creat: 0.69 mg/dL (ref 0.50–1.05)
GFR, EST AFRICAN AMERICAN: 115 mL/min/{1.73_m2} (ref >=60)
GFR, Est Non African American: 99 mL/min/{1.73_m2} (ref >=60)
GLUCOSE: 130 mg/dL — AB (ref 65–99)
Globulin: 2.9 g/dL (calc) (ref 1.9–3.7)
Potassium: 4.2 mmol/L (ref 3.5–5.3)
Sodium: 141 mmol/L (ref 135–146)
Total Bilirubin: 0.5 mg/dL (ref 0.2–1.2)
Total Protein: 6.8 g/dL (ref 6.1–8.1)

## 2019-03-11 ENCOUNTER — Ambulatory Visit (INDEPENDENT_AMBULATORY_CARE_PROVIDER_SITE_OTHER): Payer: 59 | Admitting: "Endocrinology

## 2019-03-11 ENCOUNTER — Encounter: Payer: Self-pay | Admitting: "Endocrinology

## 2019-03-11 ENCOUNTER — Other Ambulatory Visit: Payer: Self-pay

## 2019-03-11 VITALS — BP 180/88 | HR 73 | Ht 66.0 in | Wt 316.0 lb

## 2019-03-11 DIAGNOSIS — I10 Essential (primary) hypertension: Secondary | ICD-10-CM

## 2019-03-11 DIAGNOSIS — E559 Vitamin D deficiency, unspecified: Secondary | ICD-10-CM | POA: Diagnosis not present

## 2019-03-11 DIAGNOSIS — E1165 Type 2 diabetes mellitus with hyperglycemia: Secondary | ICD-10-CM | POA: Diagnosis not present

## 2019-03-11 NOTE — Patient Instructions (Signed)

## 2019-03-11 NOTE — Progress Notes (Signed)
Endocrinology follow-up note  Subjective:    Patient ID: [name] [name], female    DOB: Feb 28, 1966.  she is being seen in follow-up for management of currently uncontrolled symptomatic diabetes requested by  Monico Blitz, MD.   Past Medical History:  Diagnosis Date  . Crohn's colitis ([place])   . Diverticulosis OCT 2011    PAN-COLONIC  . DM (diabetes mellitus) ([place])   . HTN (hypertension)   . Hyperlipemia   . 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 Obesity, Class III, BMI 40-49.9 (morbid obesity) ([place]) AUG 2010 305 LBS   MAY 2012 267  . Vitamin D deficiency    Past Surgical History:  Procedure Laterality Date  . ABDOMINAL EXPLORATION SURGERY  May 05, 2009 MM   INFLAMMED TI (SEROSITIS), "DIDN'T LOOK LIKE CROHN'S"  . CHOLECYSTECTOMY    . COLONOSCOPY  DEC 2010 WO  . COLONOSCOPY  OCT 2011   MILD ERYTHEMA IN TI (30-40 CM), NL SB/COLON Bx  . ESOPHAGOGASTRODUODENOSCOPY N/A 04/14/2014   Procedure: ESOPHAGOGASTRODUODENOSCOPY (EGD);  Surgeon: Danie Binder, MD;  Location: [facility];  Service: Endoscopy;  Laterality: N/A;  8:30 AM  . LAPAROSCOPIC GASTRIC BANDING  AUG 2010 BMI 50   GASTRIC POUCH 2-3 CM  . UMBILICAL HERNIA REPAIR  AUG 2010 MATTHEW MARTIN   INCARCERATED OMENTUM  . UPPER GASTROINTESTINAL ENDOSCOPY  MAY 2012 [facility] [employer]   GASTRITIS   Social History   Socioeconomic History  . Marital status: Married    Spouse name: Not on file  . Number of children: Not on file  . Years of education: Not on file  . Highest education level: Not on file  Occupational History  . Not on file  Social Needs  . Financial resource strain: Not on file  . Food insecurity    Worry: Not on file    Inability: Not on file  . Transportation needs    Medical: Not on file    Non-medical: Not on file  Tobacco Use  . Smoking status: Never Smoker  . Smokeless tobacco: Never Used  . Tobacco comment: Never smoked  Substance and Sexual Activity  .  Alcohol use: No  . Drug use: No  . Sexual activity: Not on file  Lifestyle  . Physical activity    Days per week: Not on file    Minutes per session: Not on file  . Stress: Not on file  Relationships  . Social Herbalist on phone: Not on file    Gets together: Not on file    Attends religious service: Not on file    Active member of club or organization: Not on file    Attends meetings of clubs or organizations: Not on file    Relationship status: Not on file  Other Topics Concern  . Not on file  Social History Narrative   MARRIED, NO KIDS   Outpatient Encounter Medications as of 03/11/2019  Medication Sig  . amLODipine (NORVASC) 5 MG tablet 5 mg daily.  . Blood Glucose Monitoring Suppl (ONETOUCH VERIO) w/Device KIT 1 each by Does not apply route as needed.  . Continuous Blood Gluc Sensor (FREESTYLE LIBRE 14 DAY SENSOR) MISC USE AS DIRECTED EVERY 14 DAYS  . Continuous Blood Gluc Sensor (FREESTYLE LIBRE SENSOR SYSTEM) MISC USE 1 SENSOR EVERY 10 DAYS  . diclofenac (VOLTAREN) 75 MG EC tablet TK 1 T PO BID WF  . glucose blood (  ONETOUCH VERIO) test strip Use as instructed  . insulin lispro (HUMALOG KWIKPEN) 100 UNIT/ML KwikPen Inject 0.18-0.24 mLs (18-24 Units total) into the skin 3 (three) times daily before meals.  . Insulin Pen Needle (B-D ULTRAFINE III SHORT PEN) 31G X 8 MM MISC 1 each by Does not apply route as directed.  . Lancets Thin MISC 1 each by Does not apply route 4 (four) times daily.  Marland Kitchen lisinopril (PRINIVIL,ZESTRIL) 40 MG tablet Take 40 mg by mouth daily.  . norgestimate-ethinyl estradiol (ORTHO-CYCLEN,SPRINTEC,PREVIFEM) 0.25-35 MG-MCG tablet Take 1 tablet by mouth daily.  Marland Kitchen omeprazole (PRILOSEC) 20 MG capsule take 1 capsule by mouth every morning 30 MINUTES PRIOR TO FIRST MEAL  . potassium chloride (K-DUR) 10 MEQ tablet Take 10 mEq by mouth daily.  Marland Kitchen torsemide (DEMADEX) 20 MG tablet Take 20 mg by mouth daily.  . TRESIBA FLEXTOUCH 200 UNIT/ML SOPN INJECT 60  UNITS INTO THE SKIN AT BEDTIME  . Vitamin D, Ergocalciferol, (DRISDOL) 1.25 MG (50000 UT) CAPS capsule TAKE 1 CAPSULE BY MOUTH EVERY 7 DAYS   No facility-administered encounter medications on file as of 03/11/2019.     ALLERGIES: No Known Allergies  VACCINATION STATUS:  There is no immunization history on file for this patient.  Diabetes She presents for her follow-up diabetic visit. She has type 2 diabetes mellitus. Onset time: She was diagnosed at approximate age of 50 years. Her disease course has been stable. There are no hypoglycemic associated symptoms. Pertinent negatives for hypoglycemia include no confusion, headaches, pallor or seizures. Pertinent negatives for diabetes include no chest pain, no fatigue, no polydipsia, no polyphagia and no polyuria. There are no hypoglycemic complications. Symptoms are stable. There are no diabetic complications. Risk factors for coronary artery disease include diabetes mellitus, dyslipidemia, family history, obesity, hypertension and sedentary lifestyle. Current diabetic treatments: She is taking toujeo 50-75 units twice a day,jardiance 25 mg by mouth daily. Her weight is increasing steadily (She weighed maximum was 279 pounds before her lap band surgery 2010 with subsequent loss of 117 pounds. Unfortunately she has regained almost all of that weight back to current weight of 313 pounds.). She is following a generally unhealthy diet. When asked about meal planning, she reported none. She has not had a previous visit with a dietitian. She rarely participates in exercise. Her breakfast blood glucose range is generally 140-180 mg/dl. Her lunch blood glucose range is generally 140-180 mg/dl. Her dinner blood glucose range is generally 140-180 mg/dl. Her bedtime blood glucose range is generally 140-180 mg/dl. Her overall blood glucose range is 140-180 mg/dl. An ACE inhibitor/angiotensin II receptor blocker is being taken. Eye exam is current.  Hypertension This  is a chronic problem. The current episode started more than 1 year ago. The problem is controlled. Pertinent negatives include no chest pain, headaches, palpitations or shortness of breath. Risk factors for coronary artery disease include diabetes mellitus, obesity, sedentary lifestyle, family history and dyslipidemia. Past treatments include ACE inhibitors.    Review of Systems  Constitutional: Negative for chills, fatigue, fever and unexpected weight change.  HENT: Negative for trouble swallowing and voice change.   Eyes: Negative for visual disturbance.  Respiratory: Negative for cough, shortness of breath and wheezing.   Cardiovascular: Negative for chest pain, palpitations and leg swelling.  Gastrointestinal: Negative for diarrhea, nausea and vomiting.  Endocrine: Negative for cold intolerance, heat intolerance, polydipsia, polyphagia and polyuria.  Musculoskeletal: Negative for arthralgias and myalgias.  Skin: Negative for color change, pallor, rash and wound.  Neurological:  Negative for seizures and headaches.  Psychiatric/Behavioral: Negative for confusion and suicidal ideas.    Objective:    BP (!) 180/88   Pulse 73   Ht 5' 6" (1.676 m)   Wt (!) 316 lb (143.3 kg)   SpO2 96%   BMI 51.00 kg/m   Wt Readings from Last 3 Encounters:  03/11/19 (!) 316 lb (143.3 kg)  10/04/18 (!) 313 lb (142 kg)  04/18/18 288 lb (130.6 kg)     Physical Exam  Constitutional: She is oriented to person, place, and time. She appears well-developed.  HENT:  Head: Normocephalic and atraumatic.  Eyes: EOM are normal.  Neck: Normal range of motion. Neck supple. No tracheal deviation present. No thyromegaly present.  Cardiovascular: Normal rate and regular rhythm.  Pulmonary/Chest: Effort normal and breath sounds normal.  Abdominal: Soft. Bowel sounds are normal. There is no abdominal tenderness. There is no guarding.  Musculoskeletal: Normal range of motion.        General: No edema.   Neurological: She is alert and oriented to person, place, and time. She has normal reflexes. No cranial nerve deficit. Coordination normal.  Skin: Skin is warm and dry. No rash noted. No erythema. No pallor.  Psychiatric: She has a normal mood and affect. Judgment normal.    CMP ( most recent) CMP     Component Value Date/Time   NA 141 03/01/2019 0708   K 4.2 03/01/2019 0708   CL 103 03/01/2019 0708   CO2 28 03/01/2019 0708   GLUCOSE 130 (H) 03/01/2019 0708   BUN 18 03/01/2019 0708   BUN 12 05/01/2017   CREATININE 0.69 03/01/2019 0708   CALCIUM 9.4 03/01/2019 0708   PROT 6.8 03/01/2019 0708   ALBUMIN 3.1 (L) 07/17/2013 0701   AST 14 03/01/2019 0708   ALT 16 03/01/2019 0708   ALKPHOS 74 07/17/2013 0701   BILITOT 0.5 03/01/2019 0708   GFRNONAA 99 03/01/2019 0708   GFRAA 115 03/01/2019 0708    Diabetic Labs (most recent): Lab Results  Component Value Date   HGBA1C 7.7 (H) 03/01/2019   HGBA1C 7.6 (H) 09/26/2018   HGBA1C 7.5 (H) 04/11/2018   Lipid Panel     Component Value Date/Time   CHOL 153 09/26/2018 0733   TRIG 124 09/26/2018 0733   HDL 59 09/26/2018 0733   CHOLHDL 2.6 09/26/2018 0733   VLDL 44 (H) 07/17/2013 0701   LDLCALC 73 09/26/2018 0733    Assessment & Plan:   1. Uncontrolled type 2 diabetes mellitus with hyperglycemia ([facility]) - Patient has currently uncontrolled symptomatic type 2 DM since  53 years of age. - She came with stable blood glucose profile both fasting and postprandial and A1c of 7.7%, generally improving from 11%.    -Recent labs reviewed.  -her diabetes is complicated by obesity/sedentary life and Adri G [name] remains at a high risk for more acute and chronic complications which include CAD, CVA, CKD, retinopathy, and neuropathy. These are all discussed in detail with the patient.  - I have counseled her on diet management and weight loss, by adopting a carbohydrate restricted/protein rich diet.  - she  admits there is a room for  improvement in her diet and drink choices. -  Suggestion is made for her to avoid simple carbohydrates  from her diet including Cakes, Sweet Desserts / Pastries, Ice Cream, Soda (diet and regular), Sweet Tea, Candies, Chips, Cookies, Sweet Pastries,  Store Bought Juices, Alcohol in Excess of  1-2 drinks a day, Artificial   Sweeteners, Coffee Creamer, and "Sugar-free" Products. This will help patient to have stable blood glucose profile and potentially avoid unintended weight gain.   - I encouraged her to switch to  unprocessed or minimally processed complex starch and increased protein intake (animal or plant source), fruits, and vegetables.  - she is advised to stick to a routine mealtimes to eat 3 meals  a day and avoid unnecessary snacks ( to snack only to correct hypoglycemia).   - I have approached her with the following individualized plan to manage diabetes and patient agrees:   -   Based on her current glycemic profile, she will continue to need intensive treatment with basal/bolus insulin in order to keep diabetes under control.    -She is advised to continue Tresiba 60 units nightly, continue Humalog  18 units 3 times a day before meals for pre-meal blood glucose above 90 mg/dL (with additional correction for pre-meal blood glucose above 150 mg/dL) associated with documenting of glucose 4 times a day-before meals and at bedtime. - Patient is warned not to take insulin without proper monitoring per orders. -Patient is encouraged to call clinic for blood glucose levels less than 70 or above 300 mg /dl. - She is benefiting from continuous glucose monitoring.  - She did not tolerate metformin causing GI side effects.  -She did not tolerate Trulicity which was stopped since her last visit.    - Patient specific target  A1c;  LDL, HDL, Triglycerides, and  Waist Circumference were discussed in detail.  2) BP/HTN: Her blood pressure is not controlled to target.    She is advised to continue her  current blood pressure medications including lisinopril.    3) Lipids/HPL: Her recent lipid panel showed controlled LDL at 53, triglycerides improving to 132 from  222.   Patient is not on  Statins, will be considered for low-dose statins on subsequent visits.  4)  Weight/Diet: CDE Consult has been initiated , exercise, and detailed carbohydrates information provided. - Patient reports history of weighing  279 pounds in the past which led to a LAP-BAND weight loss surgery in 2010 which help her lose 117 pounds. - Subsequently she has regained almost all of that weight back to current weight of 313 pounds.  Due to this rapid weight gain, she will be reconsidered for low-dose Trulicity on subsequent visits.  5) Chronic Care/Health Maintenance:  -she  is on ACEI/ARB  medications and  is encouraged to continue to follow up with Ophthalmology, Dentist,  Podiatrist at least yearly or according to recommendations, and advised to  stay away from smoking. I have recommended yearly flu vaccine and pneumonia vaccination at least every 5 years; moderate intensity exercise for up to 150 minutes weekly; and  sleep for at least 7 hours a day.  - I advised patient to maintain close follow up with Monico Blitz, MD for primary care needs.  - Time spent with the patient: 25 min, of which >50% was spent in reviewing her blood glucose logs , discussing her hypoglycemia and hyperglycemia episodes, reviewing her current and  previous labs / studies and medications  doses and developing a plan to avoid hypoglycemia and hyperglycemia. Please refer to Patient Instructions for Blood Glucose Monitoring and Insulin/Medications Dosing Guide"  in media tab for additional information. Please  also refer to " Patient Self Inventory" in the Media  tab for reviewed elements of pertinent patient history.  [name] [name] participated in the discussions, expressed understanding, and voiced agreement  with the above plans.  All questions  were answered to her satisfaction. she is encouraged to contact clinic should she have any questions or concerns prior to her return visit.   Follow up plan: - Return in about 4 months (around 07/11/2019).  Gebre Nida, MD Phone: [phone]  Fax: [phone]   03/11/2019, 1:12 PM This note was partially dictated with voice recognition software. Similar sounding words can be transcribed inadequately or may not  be corrected upon review. 

## 2019-04-02 ENCOUNTER — Telehealth: Payer: Self-pay | Admitting: "Endocrinology

## 2019-04-02 NOTE — Telephone Encounter (Signed)
Patient said she mentioned to you at her last appt that her feet were going numb and that you would re evaluate at next appt. She said that her feet are getting worse & wants to know if you want to see her or could you call her something in. Thanks

## 2019-04-03 NOTE — Telephone Encounter (Signed)
She may need exam and work up, before prescription , I want her to see her PMD.

## 2019-04-03 NOTE — Telephone Encounter (Signed)
Pt is seeing PMD tomorrow at 2pm

## 2019-04-05 ENCOUNTER — Other Ambulatory Visit: Payer: Self-pay | Admitting: "Endocrinology

## 2019-04-30 ENCOUNTER — Other Ambulatory Visit: Payer: Self-pay | Admitting: "Endocrinology

## 2019-05-02 ENCOUNTER — Telehealth: Payer: Self-pay | Admitting: "Endocrinology

## 2019-05-02 NOTE — Telephone Encounter (Signed)
Pt notified by VM. Notified to let us know she received the message.

## 2019-05-02 NOTE — Telephone Encounter (Signed)
Pt states she started Gabapentin 600mg  qhs  Tuesday 11th. She thinks this is the reason for her BG elevating up over 200. I told pt this was unlikely but we would send Dr Dorris Fetch the BG numbers      Date Before breakfast Before lunch Before supper Bedtime  8/12 162 150 170 295  8/13 223                   Pt taking: Tresiba 60units, Humalog 18-24 tidac

## 2019-05-02 NOTE — Telephone Encounter (Signed)
She can increase her Tresiba to 70 units nightly.

## 2019-06-13 ENCOUNTER — Other Ambulatory Visit: Payer: Self-pay | Admitting: "Endocrinology

## 2019-07-04 ENCOUNTER — Other Ambulatory Visit: Payer: Self-pay | Admitting: "Endocrinology

## 2019-07-09 LAB — COMPLETE METABOLIC PANEL WITH GFR
AG Ratio: 1.3 (calc) (ref 1.0–2.5)
ALT: 13 U/L (ref 6–29)
AST: 12 U/L (ref 10–35)
Albumin: 3.8 g/dL (ref 3.6–5.1)
Alkaline phosphatase (APISO): 100 U/L (ref 37–153)
BUN: 11 mg/dL (ref 7–25)
CO2: 29 mmol/L (ref 20–32)
Calcium: 9.5 mg/dL (ref 8.6–10.4)
Chloride: 104 mmol/L (ref 98–110)
Creat: 0.67 mg/dL (ref 0.50–1.05)
GFR, Est African American: 116 mL/min/{1.73_m2} (ref >=60)
GFR, Est Non African American: 100 mL/min/{1.73_m2} (ref >=60)
Globulin: 2.9 g/dL (calc) (ref 1.9–3.7)
Glucose, Bld: 147 mg/dL — ABNORMAL HIGH (ref 65–99)
Potassium: 4.2 mmol/L (ref 3.5–5.3)
Sodium: 141 mmol/L (ref 135–146)
Total Bilirubin: 0.4 mg/dL (ref 0.2–1.2)
Total Protein: 6.7 g/dL (ref 6.1–8.1)

## 2019-07-09 LAB — HEMOGLOBIN A1C
Hgb A1c MFr Bld: 7.8 % of total Hgb — ABNORMAL HIGH (ref <5.7)
Mean Plasma Glucose: 177 (calc)
eAG (mmol/L): 9.8 (calc)

## 2019-07-16 ENCOUNTER — Encounter: Payer: Self-pay | Admitting: "Endocrinology

## 2019-07-16 ENCOUNTER — Ambulatory Visit (INDEPENDENT_AMBULATORY_CARE_PROVIDER_SITE_OTHER): Payer: 59 | Admitting: "Endocrinology

## 2019-07-16 ENCOUNTER — Other Ambulatory Visit: Payer: Self-pay

## 2019-07-16 DIAGNOSIS — I10 Essential (primary) hypertension: Secondary | ICD-10-CM

## 2019-07-16 DIAGNOSIS — E1165 Type 2 diabetes mellitus with hyperglycemia: Secondary | ICD-10-CM

## 2019-07-16 DIAGNOSIS — E559 Vitamin D deficiency, unspecified: Secondary | ICD-10-CM | POA: Diagnosis not present

## 2019-07-16 MED ORDER — GLIPIZIDE ER 5 MG PO TB24: 5.0000 mg | ORAL_TABLET | Freq: Every day | ORAL | 3 refills | Status: DC

## 2019-07-16 NOTE — Progress Notes (Signed)
07/16/2019                                                    Endocrinology Telehealth Visit Follow up Note -During COVID -19 Pandemic  This visit type was conducted due to national recommendations for restrictions regarding the COVID-19 Pandemic  in an effort to limit this patient's exposure and mitigate transmission of the corona virus.  Due to her co-morbid illnesses, [name] [name] is at  moderate to high risk for complications without adequate follow up.  This format is felt to be most appropriate for her at this time.  I connected with this patient on 07/16/2019   by telephone and verified that I am speaking with the correct person using two identifiers. [name] [name], [birth year]. she has verbally consented to this visit. All issues noted in this document were discussed and addressed. The format was not optimal for physical exam.    Subjective:    Patient ID: [name] [name], female    DOB: August 03, 1966.  she is being engaged in telehealth via telephone in  follow-up for management of currently uncontrolled symptomatic diabetes requested by  Monico Blitz, MD.   Past Medical History:  Diagnosis Date  . Crohn's colitis ([place])   . Diverticulosis OCT 2011    PAN-COLONIC  . DM (diabetes mellitus) ([place])   . HTN (hypertension)   . Hyperlipemia   . 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 Obesity, Class III, BMI 40-49.9 (morbid obesity) ([place]) AUG 2010 305 LBS   MAY 2012 267  . Vitamin D deficiency    Past Surgical History:  Procedure Laterality Date  . ABDOMINAL EXPLORATION SURGERY  May 05, 2009 MM   INFLAMMED TI (SEROSITIS), "DIDN'T LOOK LIKE CROHN'S"  . CHOLECYSTECTOMY    . COLONOSCOPY  DEC 2010 WO  . COLONOSCOPY  OCT 2011   MILD ERYTHEMA IN TI (30-40 CM), NL SB/COLON Bx  . ESOPHAGOGASTRODUODENOSCOPY N/A 04/14/2014   Procedure: ESOPHAGOGASTRODUODENOSCOPY (EGD);  Surgeon: Danie Binder, MD;  Location: [facility];  Service: Endoscopy;  Laterality: N/A;   8:30 AM  . LAPAROSCOPIC GASTRIC BANDING  AUG 2010 BMI 50   GASTRIC POUCH 2-3 CM  . UMBILICAL HERNIA REPAIR  AUG 2010 MATTHEW MARTIN   INCARCERATED OMENTUM  . UPPER GASTROINTESTINAL ENDOSCOPY  MAY 2012 [facility] [employer]   GASTRITIS   Social History   Socioeconomic History  . Marital status: Married    Spouse name: Not on file  . Number of children: Not on file  . Years of education: Not on file  . Highest education level: Not on file  Occupational History  . Not on file  Social Needs  . Financial resource strain: Not on file  . Food insecurity    Worry: Not on file    Inability: Not on file  . Transportation needs    Medical: Not on file    Non-medical: Not on file  Tobacco Use  . Smoking status: Never Smoker  . Smokeless tobacco: Never Used  . Tobacco comment: Never smoked  Substance and Sexual Activity  . Alcohol use: No  . Drug use: No  . Sexual activity: Not on file  Lifestyle  . Physical activity    Days per week: Not on file    Minutes per  session: Not on file  . Stress: Not on file  Relationships  . Social Herbalist on phone: Not on file    Gets together: Not on file    Attends religious service: Not on file    Active member of club or organization: Not on file    Attends meetings of clubs or organizations: Not on file    Relationship status: Not on file  Other Topics Concern  . Not on file  Social History Narrative   MARRIED, NO KIDS   Outpatient Encounter Medications as of 07/16/2019  Medication Sig  . amLODipine (NORVASC) 5 MG tablet 5 mg daily.  . Blood Glucose Monitoring Suppl (ONETOUCH VERIO) w/Device KIT 1 each by Does not apply route as needed.  . Continuous Blood Gluc Sensor (FREESTYLE LIBRE 14 DAY SENSOR) MISC USE AS DIRECTED EVERY 14 DAYS  . Continuous Blood Gluc Sensor (FREESTYLE LIBRE SENSOR SYSTEM) MISC USE 1 SENSOR EVERY 10 DAYS  . diclofenac (VOLTAREN) 75 MG EC tablet TK 1 T PO BID WF  . glipiZIDE (GLUCOTROL XL) 5 MG  24 hr tablet Take 1 tablet (5 mg total) by mouth daily with breakfast.  . glucose blood (ONETOUCH VERIO) test strip Use as instructed  . HUMALOG KWIKPEN 100 UNIT/ML KwikPen INJECT 18-24 UNITS INTO THE SKIN THREE TIMES DAILY BEFORE A MEAL  . Insulin Pen Needle (B-D ULTRAFINE III SHORT PEN) 31G X 8 MM MISC 1 each by Does not apply route as directed.  . Lancets Thin MISC 1 each by Does not apply route 4 (four) times daily.  Marland Kitchen lisinopril (PRINIVIL,ZESTRIL) 40 MG tablet Take 40 mg by mouth daily.  . norgestimate-ethinyl estradiol (ORTHO-CYCLEN,SPRINTEC,PREVIFEM) 0.25-35 MG-MCG tablet Take 1 tablet by mouth daily.  Marland Kitchen omeprazole (PRILOSEC) 20 MG capsule take 1 capsule by mouth every morning 30 MINUTES PRIOR TO FIRST MEAL  . potassium chloride (K-DUR) 10 MEQ tablet Take 10 mEq by mouth daily.  Marland Kitchen torsemide (DEMADEX) 20 MG tablet Take 20 mg by mouth daily.  . TRESIBA FLEXTOUCH 200 UNIT/ML SOPN INJECT 60 UNITS UNDER THE SKIN AT BEDTIME  . Vitamin D, Ergocalciferol, (DRISDOL) 1.25 MG (50000 UT) CAPS capsule TAKE 1 CAPSULE BY MOUTH EVERY 7 DAYS   No facility-administered encounter medications on file as of 07/16/2019.     ALLERGIES: No Known Allergies  VACCINATION STATUS:  There is no immunization history on file for this patient.  Diabetes She presents for her follow-up diabetic visit. She has type 2 diabetes mellitus. Onset time: She was diagnosed at approximate age of 64 years. Her disease course has been worsening. There are no hypoglycemic associated symptoms. Pertinent negatives for hypoglycemia include no confusion, headaches, pallor or seizures. Pertinent negatives for diabetes include no chest pain, no fatigue, no polydipsia, no polyphagia and no polyuria. There are no hypoglycemic complications. Symptoms are stable. There are no diabetic complications. Risk factors for coronary artery disease include diabetes mellitus, dyslipidemia, family history, obesity, hypertension and sedentary lifestyle.  Current diabetic treatments: She is taking toujeo 50-75 units twice a day,jardiance 25 mg by mouth daily. Her weight is increasing steadily (She weighed maximum was 279 pounds before her lap band surgery 2010 with subsequent loss of 117 pounds. Unfortunately she has regained almost all of that weight back to current weight of 313 pounds.). She is following a generally unhealthy diet. When asked about meal planning, she reported none. She has not had a previous visit with a dietitian. She rarely participates in exercise. Her breakfast  blood glucose range is generally 140-180 mg/dl. Her lunch blood glucose range is generally 140-180 mg/dl. Her dinner blood glucose range is generally 140-180 mg/dl. Her bedtime blood glucose range is generally 140-180 mg/dl. Her overall blood glucose range is 140-180 mg/dl. An ACE inhibitor/angiotensin II receptor blocker is being taken. Eye exam is current.  Hypertension This is a chronic problem. The current episode started more than 1 year ago. The problem is controlled. Pertinent negatives include no chest pain, headaches, palpitations or shortness of breath. Risk factors for coronary artery disease include diabetes mellitus, obesity, sedentary lifestyle, family history and dyslipidemia. Past treatments include ACE inhibitors.    Review of systems: Limited as above Objective:    There were no vitals taken for this visit.  Wt Readings from Last 3 Encounters:  03/11/19 (!) 316 lb (143.3 kg)  10/04/18 (!) 313 lb (142 kg)  04/18/18 288 lb (130.6 kg)      CMP     Component Value Date/Time   NA 141 07/08/2019 0710   K 4.2 07/08/2019 0710   CL 104 07/08/2019 0710   CO2 29 07/08/2019 0710   GLUCOSE 147 (H) 07/08/2019 0710   BUN 11 07/08/2019 0710   BUN 12 05/01/2017   CREATININE 0.67 07/08/2019 0710   CALCIUM 9.5 07/08/2019 0710   PROT 6.7 07/08/2019 0710   ALBUMIN 3.1 (L) 07/17/2013 0701   AST 12 07/08/2019 0710   ALT 13 07/08/2019 0710   ALKPHOS 74  07/17/2013 0701   BILITOT 0.4 07/08/2019 0710   GFRNONAA 100 07/08/2019 0710   GFRAA 116 07/08/2019 0710    Diabetic Labs (most recent): Lab Results  Component Value Date   HGBA1C 7.8 (H) 07/08/2019   HGBA1C 7.7 (H) 03/01/2019   HGBA1C 7.6 (H) 09/26/2018   Lipid Panel     Component Value Date/Time   CHOL 153 09/26/2018 0733   TRIG 124 09/26/2018 0733   HDL 59 09/26/2018 0733   CHOLHDL 2.6 09/26/2018 0733   VLDL 44 (H) 07/17/2013 0701   LDLCALC 73 09/26/2018 0733    Assessment & Plan:   1. Uncontrolled type 2 diabetes mellitus with hyperglycemia ([place]) - Patient has currently uncontrolled symptomatic type 2 DM since  53 years of age. - She came with stable blood glucose profile both fasting and postprandial and A1c of 7.8%, generally improving from 11%.    -Recent labs reviewed.  -her diabetes is complicated by obesity/sedentary life and Kamren Heskett remains at a high risk for more acute and chronic complications which include CAD, CVA, CKD, retinopathy, and neuropathy. These are all discussed in detail with the patient.  - I have counseled her on diet management and weight loss, by adopting a carbohydrate restricted/protein rich diet.  - she  admits there is a room for improvement in her diet and drink choices. -  Suggestion is made for her to avoid simple carbohydrates  from her diet including Cakes, Sweet Desserts / Pastries, Ice Cream, Soda (diet and regular), Sweet Tea, Candies, Chips, Cookies, Sweet Pastries,  Store Bought Juices, Alcohol in Excess of  1-2 drinks a day, Artificial Sweeteners, Coffee Creamer, and "Sugar-free" Products. This will help patient to have stable blood glucose profile and potentially avoid unintended weight gain.   - I encouraged her to switch to  unprocessed or minimally processed complex starch and increased protein intake (animal or plant source), fruits, and vegetables.  - she is advised to stick to a routine mealtimes to eat 3 meals  a  day and avoid unnecessary snacks ( to snack only to correct hypoglycemia).   - I have approached her with the following individualized plan to manage diabetes and patient agrees:   -   Based on her current glycemic profile, she will continue to need intensive treatment with basal/bolus insulin in order to keep diabetes under control.    -She is advised to continue Tresiba 60 units nightly, continue Humalog  18 units 3 times a day before meals for pre-meal blood glucose above 90 mg/dL (with additional correction for pre-meal blood glucose above 150 mg/dL) associated with documenting of glucose 4 times a day-before meals and at bedtime. - Patient is warned not to take insulin without proper monitoring per orders. -Patient is encouraged to call clinic for blood glucose levels less than 70 or above 300 mg /dl. - She is benefiting from continuous glucose monitoring.  - She did not tolerate metformin causing GI side effects.  -She did not tolerate Trulicity which was stopped since her last visit.   -She will benefit from a low-dose glipizide therapy.  I discussed and added glipizide 5 mg XL p.o. daily at breakfast.  - Patient specific target  A1c;  LDL, HDL, Triglycerides, and  Waist Circumference were discussed in detail.  2) BP/HTN: Her blood pressure is not controlled to target.    She is advised to continue her current blood pressure medications including lisinopril 40 mg p.o. daily.    3) Lipids/HPL: Her recent lipid panel showed controlled LDL at 53, triglycerides improving to 132 from  222.   Patient is not on  Statins, will be considered for low-dose statins on subsequent visits.  4)  Weight/Diet: CDE Consult has been initiated , exercise, and detailed carbohydrates information provided. - Patient reports history of weighing  279 pounds in the past which led to a LAP-BAND weight loss surgery in 2010 which help her lose 117 pounds. - Subsequently she has regained almost all of that weight  back to current weight of 316 pounds.  She is currently under evaluation for sleeve gastrectomy with Dr. Hassell Done.  She is encouraged to continue close follow-up with them and encouraged to pursue this option.    5) Chronic Care/Health Maintenance:  -she  is on ACEI/ARB  medications and  is encouraged to continue to follow up with Ophthalmology, Dentist,  Podiatrist at least yearly or according to recommendations, and advised to  stay away from smoking. I have recommended yearly flu vaccine and pneumonia vaccination at least every 5 years; moderate intensity exercise for up to 150 minutes weekly; and  sleep for at least 7 hours a day.  - I advised patient to maintain close follow up with Monico Blitz, MD for primary care needs.  - Patient Care Time Today:  25 min, of which >50% was spent in  counseling and the rest reviewing her  current and  previous labs/studies, previous treatments, her blood glucose readings, and medications' doses and developing a plan for long-term care based on the latest recommendations for standards of care.   [name] [name] participated in the discussions, expressed understanding, and voiced agreement with the above plans.  All questions were answered to her satisfaction. she is encouraged to contact clinic should she have any questions or concerns prior to her return visit.   Follow up plan: - Return in about 3 months (around 10/16/2019) for Bring Meter and Logs- A1c in Office.  Glade Lloyd, MD Phone: [phone]  Fax: [phone]   07/16/2019, 4:00 PM  This note was partially dictated with voice recognition software. Similar sounding words can be transcribed inadequately or may not  be corrected upon review.

## 2019-07-17 ENCOUNTER — Other Ambulatory Visit: Payer: Self-pay | Admitting: Surgery

## 2019-07-17 DIAGNOSIS — R1310 Dysphagia, unspecified: Secondary | ICD-10-CM

## 2019-07-25 ENCOUNTER — Ambulatory Visit
Admission: RE | Admit: 2019-07-25 | Discharge: 2019-07-25 | Disposition: A | Discharge status: Home / Self Care | Payer: Managed Care, Other (non HMO) | Source: Ambulatory Visit | Attending: Surgery | Admitting: Surgery

## 2019-07-25 ENCOUNTER — Other Ambulatory Visit: Payer: Self-pay | Admitting: Surgery

## 2019-07-25 DIAGNOSIS — R1310 Dysphagia, unspecified: Secondary | ICD-10-CM

## 2019-07-29 ENCOUNTER — Other Ambulatory Visit: Payer: Self-pay | Admitting: "Endocrinology

## 2019-09-26 ENCOUNTER — Other Ambulatory Visit: Payer: Self-pay | Admitting: "Endocrinology

## 2019-10-07 ENCOUNTER — Other Ambulatory Visit: Payer: Self-pay

## 2019-10-07 DIAGNOSIS — E1165 Type 2 diabetes mellitus with hyperglycemia: Secondary | ICD-10-CM

## 2019-10-07 MED ORDER — BD PEN NEEDLE SHORT U/F 31G X 8 MM MISC: 1.0000 | 1 refills | Status: DC

## 2019-10-07 MED ORDER — INSULIN LISPRO (1 UNIT DIAL) 100 UNIT/ML (KWIKPEN): 18.0000 [IU] | PEN_INJECTOR | Freq: Three times a day (TID) | SUBCUTANEOUS | 2 refills | Status: DC

## 2019-10-17 ENCOUNTER — Other Ambulatory Visit: Payer: Self-pay

## 2019-10-17 ENCOUNTER — Ambulatory Visit (INDEPENDENT_AMBULATORY_CARE_PROVIDER_SITE_OTHER): Payer: 59 | Admitting: "Endocrinology

## 2019-10-17 ENCOUNTER — Encounter: Payer: Self-pay | Admitting: "Endocrinology

## 2019-10-17 VITALS — BP 132/68 | HR 76 | Resp 16 | Ht 66.0 in | Wt 317.8 lb

## 2019-10-17 DIAGNOSIS — E1165 Type 2 diabetes mellitus with hyperglycemia: Secondary | ICD-10-CM | POA: Diagnosis not present

## 2019-10-17 DIAGNOSIS — E559 Vitamin D deficiency, unspecified: Secondary | ICD-10-CM

## 2019-10-17 DIAGNOSIS — I10 Essential (primary) hypertension: Secondary | ICD-10-CM

## 2019-10-17 LAB — POCT GLYCOSYLATED HEMOGLOBIN (HGB A1C): Hemoglobin A1C: 7.6 % — AB (ref 4.0–5.6)

## 2019-10-17 MED ORDER — FREESTYLE LIBRE 14 DAY SENSOR MISC: 2 refills | Status: DC

## 2019-10-17 MED ORDER — TRESIBA FLEXTOUCH 200 UNIT/ML ~~LOC~~ SOPN: 64.0000 [IU] | PEN_INJECTOR | Freq: Every day | SUBCUTANEOUS | 2 refills | Status: DC

## 2019-10-17 NOTE — Progress Notes (Signed)
10/17/2019                                                    Endocrinology Telehealth Visit Follow up Note -During COVID -19 Pandemic  This visit type was conducted due to national recommendations for restrictions regarding the COVID-19 Pandemic  in an effort to limit this patient's exposure and mitigate transmission of the corona virus.  Due to her co-morbid illnesses, [name] [name] is at  moderate to high risk for complications without adequate follow up.  This format is felt to be most appropriate for her at this time.  I connected with this patient on 10/17/2019   by telephone and verified that I am speaking with the correct person using two identifiers. [name] [name], Apr 03, 1966. she has verbally consented to this visit. All issues noted in this document were discussed and addressed. The format was not optimal for physical exam.    Subjective:    Patient ID: [name] [name], female    DOB: Jun 26, 1966.  she is being engaged in telehealth via telephone in  follow-up for management of currently uncontrolled symptomatic diabetes requested by  Caryl Bis, MD.   Past Medical History:  Diagnosis Date  . Crohn's colitis ([place])   . Diverticulosis OCT 2011    PAN-COLONIC  . DM (diabetes mellitus) ([place])   . HTN (hypertension)   . Hyperlipemia   . 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 Obesity, Class III, BMI 40-49.9 (morbid obesity) ([place]) AUG 2010 305 LBS   MAY 2012 267  . Vitamin D deficiency    Past Surgical History:  Procedure Laterality Date  . ABDOMINAL EXPLORATION SURGERY  May 05, 2009 MM   INFLAMMED TI (SEROSITIS), "DIDN'T LOOK LIKE CROHN'S"  . CHOLECYSTECTOMY    . COLONOSCOPY  DEC 2010 WO  . COLONOSCOPY  OCT 2011   MILD ERYTHEMA IN TI (30-40 CM), NL SB/COLON Bx  . ESOPHAGOGASTRODUODENOSCOPY N/A 04/14/2014   Procedure: ESOPHAGOGASTRODUODENOSCOPY (EGD);  Surgeon: Danie Binder, MD;  Location: [facility];  Service: Endoscopy;  Laterality:  N/A;  8:30 AM  . LAPAROSCOPIC GASTRIC BANDING  AUG 2010 BMI 50   GASTRIC POUCH 2-3 CM  . UMBILICAL HERNIA REPAIR  AUG 2010 MATTHEW MARTIN   INCARCERATED OMENTUM  . UPPER GASTROINTESTINAL ENDOSCOPY  MAY 2012 [facility] [employer]LE [employer]ACEMENT   GASTRITIS   Social History   Socioeconomic History  . Marital status: Married    Spouse name: Not on file  . Number of children: Not on file  . Years of education: Not on file  . Highest education level: Not on file  Occupational History  . Not on file  Tobacco Use  . Smoking status: Never Smoker  . Smokeless tobacco: Never Used  . Tobacco comment: Never smoked  Substance and Sexual Activity  . Alcohol use: No  . Drug use: No  . Sexual activity: Not on file  Other Topics Concern  . Not on file  Social History Narrative   MARRIED, NO KIDS   Social Determinants of Health   Financial Resource Strain:   . Difficulty of Paying Living Expenses: Not on file  Food Insecurity:   . Worried About Charity fundraiser in the Last Year: Not on file  . Ran Out of Food in  the Last Year: Not on file  Transportation Needs:   . Lack of Transportation (Medical): Not on file  . Lack of Transportation (Non-Medical): Not on file  Physical Activity:   . Days of Exercise per Week: Not on file  . Minutes of Exercise per Session: Not on file  Stress:   . Feeling of Stress : Not on file  Social Connections:   . Frequency of Communication with Friends and Family: Not on file  . Frequency of Social Gatherings with Friends and Family: Not on file  . Attends Religious Services: Not on file  . Active Member of Clubs or Organizations: Not on file  . Attends Archivist Meetings: Not on file  . Marital Status: Not on file   Outpatient Encounter Medications as of 10/17/2019  Medication Sig  . amLODipine (NORVASC) 5 MG tablet 5 mg daily.  . Blood Glucose Monitoring Suppl (ONETOUCH VERIO) w/Device KIT 1 each by Does not apply route as needed.  . Continuous  Blood Gluc Sensor (FREESTYLE LIBRE 14 DAY SENSOR) MISC USE AS DIRECTED EVERY 14 DAYS  . Continuous Blood Gluc Sensor (FREESTYLE LIBRE SENSOR SYSTEM) MISC USE 1 SENSOR EVERY 10 DAYS  . diclofenac (VOLTAREN) 75 MG EC tablet TK 1 T PO BID WF  . glipiZIDE (GLUCOTROL XL) 5 MG 24 hr tablet Take 1 tablet (5 mg total) by mouth daily with breakfast.  . glucose blood (ONETOUCH VERIO) test strip Use as instructed  . Insulin Degludec (TRESIBA FLEXTOUCH) 200 UNIT/ML SOPN Inject 64 Units into the skin at bedtime.  . insulin lispro (HUMALOG KWIKPEN) 100 UNIT/ML KwikPen Inject 0.18 mLs (18 Units total) into the skin 3 (three) times daily.  . Insulin Pen Needle (B-D ULTRAFINE III SHORT PEN) 31G X 8 MM MISC 1 each by Does not apply route as directed.  . Lancets Thin MISC 1 each by Does not apply route 4 (four) times daily.  Marland Kitchen lisinopril (PRINIVIL,ZESTRIL) 40 MG tablet Take 40 mg by mouth daily.  . potassium chloride (K-DUR) 10 MEQ tablet Take 10 mEq by mouth daily.  Marland Kitchen torsemide (DEMADEX) 20 MG tablet Take 20 mg by mouth daily.  . Vitamin D, Ergocalciferol, (DRISDOL) 1.25 MG (50000 UT) CAPS capsule TAKE 1 CAPSULE BY MOUTH EVERY 7 DAYS  . [DISCONTINUED] Continuous Blood Gluc Sensor (FREESTYLE LIBRE 14 DAY SENSOR) MISC USE AS DIRECTED EVERY 14 DAYS  . [DISCONTINUED] norgestimate-ethinyl estradiol (ORTHO-CYCLEN,SPRINTEC,PREVIFEM) 0.25-35 MG-MCG tablet Take 1 tablet by mouth daily.  . [DISCONTINUED] omeprazole (PRILOSEC) 20 MG capsule take 1 capsule by mouth every morning 30 MINUTES PRIOR TO FIRST MEAL  . [DISCONTINUED] TRESIBA FLEXTOUCH 200 UNIT/ML SOPN INJECT 60 UNITS UNDER THE SKIN AT BEDTIME   No facility-administered encounter medications on file as of 10/17/2019.    ALLERGIES: No Known Allergies  VACCINATION STATUS:  There is no immunization history on file for this patient.  Diabetes She presents for her follow-up diabetic visit. She has type 2 diabetes mellitus. Onset time: She was diagnosed at  approximate age of 89 years. Her disease course has been worsening. There are no hypoglycemic associated symptoms. Pertinent negatives for hypoglycemia include no confusion, headaches, pallor or seizures. Pertinent negatives for diabetes include no chest pain, no fatigue, no polydipsia, no polyphagia and no polyuria. There are no hypoglycemic complications. Symptoms are improving. There are no diabetic complications. Risk factors for coronary artery disease include diabetes mellitus, dyslipidemia, family history, obesity, hypertension and sedentary lifestyle. Current diabetic treatments: She is taking toujeo 50-75 units twice  a day,jardiance 25 mg by mouth daily. Her weight is increasing steadily (She weighed maximum was 279 pounds before her lap band surgery 2010 with subsequent loss of 117 pounds. Unfortunately she has regained almost all of that weight back to current weight of 313 pounds.). She is following a generally unhealthy diet. When asked about meal planning, she reported none. She has not had a previous visit with a dietitian. She rarely participates in exercise. Her home blood glucose trend is fluctuating minimally. Her breakfast blood glucose range is generally 140-180 mg/dl. Her lunch blood glucose range is generally 140-180 mg/dl. Her dinner blood glucose range is generally 140-180 mg/dl. Her bedtime blood glucose range is generally 140-180 mg/dl. Her overall blood glucose range is 140-180 mg/dl. An ACE inhibitor/angiotensin II receptor blocker is being taken. Eye exam is current.  Hypertension This is a chronic problem. The current episode started more than 1 year ago. The problem is controlled. Pertinent negatives include no chest pain, headaches, palpitations or shortness of breath. Risk factors for coronary artery disease include diabetes mellitus, obesity, sedentary lifestyle, family history and dyslipidemia. Past treatments include ACE inhibitors.    Review of systems:   Constitutional:  +weight gain, no fatigue, no subjective hyperthermia, no subjective hypothermia Eyes: no blurry vision, no xerophthalmia ENT: no sore throat, no nodules palpated in throat, no dysphagia/odynophagia, no hoarseness Cardiovascular: no Chest Pain, no Shortness of Breath, no palpitations, no leg swelling Respiratory: no cough, no SOB Gastrointestinal: no Nausea/Vomiting/Diarhhea Musculoskeletal: no muscle/joint aches Skin: no rashes Neurological: no tremors, no numbness, no tingling, no dizziness Psychiatric: no depression, no anxiety  Objective:    BP 132/68   Pulse 76   Resp 16   Ht 5' 6" (1.676 m)   Wt (!) 317 lb 12.8 oz (144.2 kg)   LMP 11/01/2015   BMI 51.29 kg/m   Wt Readings from Last 3 Encounters:  10/17/19 (!) 317 lb 12.8 oz (144.2 kg)  03/11/19 (!) 316 lb (143.3 kg)  10/04/18 (!) 313 lb (142 kg)     Physical Exam- Limited  Constitutional:  Body mass index is 51.29 kg/m. , not in acute distress, normal state of mind Eyes:  EOMI, no exophthalmos Neck: Supple Respiratory: Adequate breathing efforts Musculoskeletal: no gross deformities, strength intact in all four extremities, no gross restriction of joint movements Skin:  no rashes, no hyperemia Neurological: no tremor with outstretched hands.   CMP     Component Value Date/Time   NA 141 07/08/2019 0710   K 4.2 07/08/2019 0710   CL 104 07/08/2019 0710   CO2 29 07/08/2019 0710   GLUCOSE 147 (H) 07/08/2019 0710   BUN 11 07/08/2019 0710   BUN 12 05/01/2017 0000   CREATININE 0.67 07/08/2019 0710   CALCIUM 9.5 07/08/2019 0710   PROT 6.7 07/08/2019 0710   ALBUMIN 3.1 (L) 07/17/2013 0701   AST 12 07/08/2019 0710   ALT 13 07/08/2019 0710   ALKPHOS 74 07/17/2013 0701   BILITOT 0.4 07/08/2019 0710   GFRNONAA 100 07/08/2019 0710   GFRAA 116 07/08/2019 0710    Diabetic Labs (most recent): Lab Results  Component Value Date   HGBA1C 7.6 (A) 10/17/2019   HGBA1C 7.8 (H) 07/08/2019   HGBA1C 7.7 (H) 03/01/2019    Lipid Panel     Component Value Date/Time   CHOL 153 09/26/2018 0733   TRIG 124 09/26/2018 0733   HDL 59 09/26/2018 0733   CHOLHDL 2.6 09/26/2018 0733   VLDL 44 (H) 07/17/2013 0701  [place] 73 09/26/2018 0733    Assessment & Plan:   1. Uncontrolled type 2 diabetes mellitus with hyperglycemia ([place]) - Patient has currently uncontrolled symptomatic type 2 DM since  54 years of age. - She came with controlled and near target glycemic profile.    Her CGM analysis shows 56% time range, 44% above range, 0% hypoglycemia.  On average blood glucose is 175 mg per DL.  Her point-of-care A1c today 7.6%, generally improving from 11%.    -Recent labs reviewed.  -her diabetes is complicated by obesity/sedentary life and Idabell Picking remains at a high risk for more acute and chronic complications which include CAD, CVA, CKD, retinopathy, and neuropathy. These are all discussed in detail with the patient.  - I have counseled her on diet management and weight loss, by adopting a carbohydrate restricted/protein rich diet.  - she  admits there is a room for improvement in her diet and drink choices. -  Suggestion is made for her to avoid simple carbohydrates  from her diet including Cakes, Sweet Desserts / Pastries, Ice Cream, Soda (diet and regular), Sweet Tea, Candies, Chips, Cookies, Sweet Pastries,  Store Bought Juices, Alcohol in Excess of  1-2 drinks a day, Artificial Sweeteners, Coffee Creamer, and "Sugar-free" Products. This will help patient to have stable blood glucose profile and potentially avoid unintended weight gain.   - I encouraged her to switch to  unprocessed or minimally processed complex starch and increased protein intake (animal or plant source), fruits, and vegetables.  - she is advised to stick to a routine mealtimes to eat 3 meals  a day and avoid unnecessary snacks ( to snack only to correct hypoglycemia).   - I have approached her with the following individualized plan  to manage diabetes and patient agrees:   -   Based on her current glycemic profile, she will continue to need intensive treatment with basal/bolus insulin in order to keep diabetes under control.    -She is advised to increase Tresiba to 64 units nightly , continue Humalog 18  units 3 times a day before meals for pre-meal blood glucose above 90 mg/dL (with additional correction for pre-meal blood glucose above 150 mg/dL) associated with documenting of glucose 4 times a day-before meals and at bedtime. - Patient is warned not to take insulin without proper monitoring per orders. -Patient is encouraged to call clinic for blood glucose levels less than 70 or above 300 mg /dl. - She is benefiting from continuous glucose monitoring.  - She did not tolerate metformin causing GI side effects.  -She did not tolerate Trulicity which was stopped since her last visit.   -She has benefited from low-dose glipizide therapy.  She is advised to continue glipizide 5 mg XL p.o. daily at breakfast.  - Patient specific target  A1c;  LDL, HDL, Triglycerides, were discussed in detail.  2) BP/HTN: Her blood pressure is controlled to target.  She is advised to continue her current blood pressure medications including lisinopril 40 mg p.o. daily.    3) Lipids/HPL: Her recent lipid panel showed controlled LDL at 53, triglycerides improving to 132 from  222.   Patient is not on  Statins, will be considered for low-dose statins on subsequent visits.  4)  Weight/Diet: CDE Consult has been initiated , exercise, and detailed carbohydrates information provided. - Patient reports history of weighing  279 pounds in the past which led to a LAP-BAND weight loss surgery in 2010 which help her lose 117 pounds. -  Subsequently she has regained almost all of that weight back to current weight of 316 pounds.  She is currently under evaluation for sleeve gastrectomy with Dr. Hassell Done.  She is encouraged to continue close follow-up with  them and encouraged to pursue this option.  -She is currently being reevaluated for repeat bariatric surgery.   5) vitamin D deficiency: She is advised to continue vitamin D2 50,000 units weekly.   6) Chronic Care/Health Maintenance:  -she  is on ACEI/ARB  medications and  is encouraged to continue to follow up with Ophthalmology, Dentist,  Podiatrist at least yearly or according to recommendations, and advised to  stay away from smoking. I have recommended yearly flu vaccine and pneumonia vaccination at least every 5 years; moderate intensity exercise for up to 150 minutes weekly; and  sleep for at least 7 hours a day.  - I advised patient to maintain close follow up with Caryl Bis, MD for primary care needs.  - Time spent on this patient care encounter:  35 min, of which >50% was spent in  counseling and the rest reviewing her  current and  previous labs/studies ( including abstraction from other facilities),  previous treatments, her blood glucose readings, and medications' doses and developing a plan for long-term care based on the latest recommendations for standards of care; and documenting her care.  [name] [name] participated in the discussions, expressed understanding, and voiced agreement with the above plans.  All questions were answered to her satisfaction. she is encouraged to contact clinic should she have any questions or concerns prior to her return visit.   Follow up plan: - Return in about 3 months (around 01/15/2020) for Bring Meter and Logs- A1c in Office.  Glade Lloyd, MD Phone: [phone]  Fax: [phone]   10/17/2019, 12:49 PM This note was partially dictated with voice recognition software. Similar sounding words can be transcribed inadequately or may not  be corrected upon review.

## 2019-11-13 ENCOUNTER — Other Ambulatory Visit: Payer: Self-pay | Admitting: "Endocrinology

## 2019-12-11 ENCOUNTER — Encounter: Payer: Managed Care, Other (non HMO) | Attending: Surgery | Admitting: Skilled Nursing Facility1

## 2019-12-11 ENCOUNTER — Other Ambulatory Visit: Payer: Self-pay

## 2019-12-11 ENCOUNTER — Encounter: Payer: Self-pay | Admitting: Skilled Nursing Facility1

## 2019-12-11 DIAGNOSIS — Z713 Dietary counseling and surveillance: Secondary | ICD-10-CM | POA: Diagnosis present

## 2019-12-11 DIAGNOSIS — Z6841 Body Mass Index (BMI) 40.0 and over, adult: Secondary | ICD-10-CM | POA: Insufficient documentation

## 2019-12-11 DIAGNOSIS — Z79899 Other long term (current) drug therapy: Secondary | ICD-10-CM | POA: Insufficient documentation

## 2019-12-11 DIAGNOSIS — K9509 Other complications of gastric band procedure: Secondary | ICD-10-CM | POA: Insufficient documentation

## 2019-12-11 DIAGNOSIS — E669 Obesity, unspecified: Secondary | ICD-10-CM

## 2019-12-11 DIAGNOSIS — Z794 Long term (current) use of insulin: Secondary | ICD-10-CM | POA: Insufficient documentation

## 2019-12-11 NOTE — Progress Notes (Signed)
Nutrition Assessment for Bariatric Surgery Medical Nutrition Therapy  Patient was seen on 12/11/2019 for Pre-Operative Nutrition Assessment. Letter of approval faxed to [facility] bariatric surgery program coordinator on 12/11/2019.   Referral stated Supervised Weight Loss (SWL) visits needed: 0  Planned surgery: Lapband to Sleeve Pt expectation of surgery: to lose weight Pt expectation of dietitian: none stated     NUTRITION ASSESSMENT   Anthropometrics  Start weight at [facility]: 327.7 lbs (date: 12/11/2019)  Height: 66 in BMI: 52.89 kg/m2     Clinical  Medical hx: DM Type 2, crohns, HTN, hyperlipidemia Medications: insulin (long and short), glipizide Labs: A1C 7.6 Notable signs/symptoms:   Lifestyle & Dietary Hx  Pt states she has had DM for about 18 years. Pt states she does have a continuous blood glucose monitor. Glucose numbers: in the 100's sometimes in the 200's. Pt is familiar with hypoglycemia and how to treat it.  Pt did have the lapband in 2010. Pt states she lives with her parents.  Pt states she lost about 70 pounds with the lapband and states at first she was successful but then with problems needed the solution taken out of the band. Pt state she has had the solution out of the band for about 3 years. Pt states she kept the weight off until the last 3 years.  Pt sates she has had a kidney stone: Dietitian educated pt on choosing calcium citrate for calcium supplement.    24-Hr Dietary Recall First Meal: [place] sandwich  Snack: cracker pack Second Meal: sandwich Snack: chips and cheese Third Meal: chicken or hamburger steak or chicken tenders Snack: yogurt  Beverages: diet soda, water, unsweet tea   Estimated Energy Needs Calories: 1600   NUTRITION DIAGNOSIS  Overweight/obesity ([place]-3.3) related to past poor dietary habits and physical inactivity as evidenced by patient w/ planned band to sleeve surgery following dietary guidelines for continued  weight loss.    NUTRITION INTERVENTION  Nutrition counseling (C-1) and education (E-2) to facilitate bariatric surgery goals.   Pre-Op Goals Reviewed with the Patient . Track food and beverage intake (pen and paper, MyFitness Pal, Baritastic app, etc.) . Make healthy food choices while monitoring portion sizes . Consume 3 meals per day or try to eat every 3-5 hours . Avoid concentrated sugars and fried foods . Keep sugar & fat in the single digits per serving on food labels . Practice CHEWING your food (aim for applesauce consistency) . Practice not drinking 15 minutes before, during, and 30 minutes after each meal and snack . Avoid all carbonated beverages (ex: soda, sparkling beverages)  . Limit caffeinated beverages (ex: coffee, tea, energy drinks) . Avoid all sugar-sweetened beverages (ex: regular soda, sports drinks)  . Avoid alcohol  . Aim for 64-100 ounces of FLUID daily (with at least half of fluid intake being plain water)  . Aim for at least 60-80 grams of PROTEIN daily . Look for a liquid protein source that contains ?15 g protein and ?5 g carbohydrate (ex: shakes, drinks, shots) . Make a list of non-food related activities . Physical activity is an important part of a healthy lifestyle so keep it moving! The goal is to reach 150 minutes of exercise per week, including cardiovascular and weight baring activity.  *Goals that are bolded indicate the pt would like to start working towards these  Handouts Provided Include  . Bariatric Surgery handouts (Nutrition Visits, Pre-Op Goals, Protein Shakes, Vitamins & Minerals)  Learning Style & Readiness for Change Teaching  method utilized: Special educational needs teacher  Demonstrated degree of understanding via: Teach Back  Barriers to learning/adherence to lifestyle change: none stated     MONITORING & EVALUATION Dietary intake, weekly physical activity, body weight, and pre-op goals reached at next nutrition visit.    Next Steps  Patient  is to follow up at [facility] for Pre-Op Class >2 weeks before surgery for further nutrition education.

## 2019-12-16 ENCOUNTER — Encounter: Payer: Managed Care, Other (non HMO) | Attending: Surgery | Admitting: Skilled Nursing Facility1

## 2019-12-16 ENCOUNTER — Other Ambulatory Visit: Payer: Self-pay | Admitting: "Endocrinology

## 2019-12-16 ENCOUNTER — Other Ambulatory Visit: Payer: Self-pay

## 2019-12-16 DIAGNOSIS — E669 Obesity, unspecified: Secondary | ICD-10-CM | POA: Insufficient documentation

## 2019-12-16 NOTE — Progress Notes (Signed)
Pre-Operative Nutrition Class:  Appt start time: 6861   End time:  1830.  Patient was seen on 12/16/2019 for Pre-Operative Bariatric Surgery [facility]   Surgery date:  Surgery type: band to sleeve Start weight at [facility]: 327.7 Weight today: 325  Samples given per MNT protocol. Patient educated on appropriate usage: Bariatric Advantage Multivitamin Lot #[id number] Exp:08/21  Bariatric Advantage Calcium  Lot #[id number] Exp:09/21/2020  Protein Shake Lot #[id number] Exp:02/03/2021  The following the learning objectives were met by the patient during this course:  Identify Pre-Op Dietary Goals and will begin 2 weeks pre-operatively  Identify appropriate sources of fluids and proteins   State protein recommendations and appropriate sources pre and post-operatively  Identify Post-Operative Dietary Goals and will follow for 2 weeks post-operatively  Identify appropriate multivitamin and calcium sources  Describe the need for physical activity post-operatively and will follow MD recommendations  State when to call healthcare provider regarding medication questions or post-operative complications  Handouts given during class include:  Pre-Op Bariatric Surgery Diet Handout  Protein Shake Handout  Post-Op Bariatric Surgery Nutrition Handout  [facility] Program Information Flyer  Support Group Information Flyer  [facility] Bariatric Supplements Price List  Follow-Up Plan: Patient will follow-up at [facility] 2 weeks post operatively for diet advancement per MD.

## 2019-12-23 ENCOUNTER — Other Ambulatory Visit: Payer: Self-pay

## 2019-12-23 MED ORDER — VITAMIN D (ERGOCALCIFEROL) 1.25 MG (50000 UNIT) PO CAPS: ORAL_CAPSULE | ORAL | 0 refills | Status: DC

## 2019-12-24 ENCOUNTER — Other Ambulatory Visit: Payer: Self-pay

## 2019-12-24 MED ORDER — ONETOUCH VERIO VI STRP: ORAL_STRIP | 1 refills | Status: DC

## 2019-12-24 MED ORDER — LANCETS THIN MISC: 1.0000 | Freq: Four times a day (QID) | 1 refills | Status: DC

## 2020-01-21 ENCOUNTER — Ambulatory Visit (INDEPENDENT_AMBULATORY_CARE_PROVIDER_SITE_OTHER): Payer: 59 | Admitting: Psychology

## 2020-01-23 ENCOUNTER — Other Ambulatory Visit: Payer: Self-pay

## 2020-01-23 ENCOUNTER — Ambulatory Visit: Payer: 59 | Admitting: "Endocrinology

## 2020-01-23 MED ORDER — FREESTYLE LIBRE 14 DAY SENSOR MISC: 2 refills | Status: DC

## 2020-01-31 ENCOUNTER — Ambulatory Visit (INDEPENDENT_AMBULATORY_CARE_PROVIDER_SITE_OTHER): Payer: 59 | Admitting: Psychology

## 2020-02-05 ENCOUNTER — Other Ambulatory Visit: Payer: Self-pay | Admitting: "Endocrinology

## 2020-02-05 ENCOUNTER — Other Ambulatory Visit: Payer: Self-pay

## 2020-02-05 ENCOUNTER — Encounter: Payer: Self-pay | Admitting: "Endocrinology

## 2020-02-05 ENCOUNTER — Ambulatory Visit (INDEPENDENT_AMBULATORY_CARE_PROVIDER_SITE_OTHER): Payer: 59 | Admitting: "Endocrinology

## 2020-02-05 VITALS — BP 158/78 | HR 81 | Ht 66.0 in | Wt 335.8 lb

## 2020-02-05 DIAGNOSIS — E1165 Type 2 diabetes mellitus with hyperglycemia: Secondary | ICD-10-CM

## 2020-02-05 DIAGNOSIS — I10 Essential (primary) hypertension: Secondary | ICD-10-CM | POA: Diagnosis not present

## 2020-02-05 DIAGNOSIS — E559 Vitamin D deficiency, unspecified: Secondary | ICD-10-CM

## 2020-02-05 LAB — POCT GLYCOSYLATED HEMOGLOBIN (HGB A1C): Hemoglobin A1C: 7.5 % — AB (ref 4.0–5.6)

## 2020-02-05 NOTE — Patient Instructions (Signed)

## 2020-02-05 NOTE — Progress Notes (Signed)
02/05/2020  Endocrinology follow-up note   Subjective:    Patient ID: [name] [name], female    DOB: Jan 18, 1966.  she is being seen in follow-up for the  management of currently uncontrolled symptomatic diabetes requested by  Caryl Bis, MD.   Past Medical History:  Diagnosis Date  . Crohn's colitis ([place])   . Diverticulosis OCT 2011    PAN-COLONIC  . DM (diabetes mellitus) ([place])   . HTN (hypertension)   . Hyperlipemia   . 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 Obesity, Class III, BMI 40-49.9 (morbid obesity) ([place]) AUG 2010 305 LBS   MAY 2012 267  . Vitamin D deficiency    Past Surgical History:  Procedure Laterality Date  . ABDOMINAL EXPLORATION SURGERY  May 05, 2009 MM   INFLAMMED TI (SEROSITIS), "DIDN'T LOOK LIKE CROHN'S"  . CHOLECYSTECTOMY    . COLONOSCOPY  DEC 2010 WO  . COLONOSCOPY  OCT 2011   MILD ERYTHEMA IN TI (30-40 CM), NL SB/COLON Bx  . ESOPHAGOGASTRODUODENOSCOPY N/A 04/14/2014   Procedure: ESOPHAGOGASTRODUODENOSCOPY (EGD);  Surgeon: Danie Binder, MD;  Location: [facility];  Service: Endoscopy;  Laterality: N/A;  8:30 AM  . LAPAROSCOPIC GASTRIC BANDING  AUG 2010 BMI 50   GASTRIC POUCH 2-3 CM  . UMBILICAL HERNIA REPAIR  AUG 2010 MATTHEW MARTIN   INCARCERATED OMENTUM  . UPPER GASTROINTESTINAL ENDOSCOPY  MAY 2012 [facility] [employer]   GASTRITIS   Social History   Socioeconomic History  . Marital status: Married    Spouse name: Not on file  . Number of children: Not on file  . Years of education: Not on file  . Highest education level: Not on file  Occupational History  . Not on file  Tobacco Use  . Smoking status: Never Smoker  . Smokeless tobacco: Never Used  . Tobacco comment: Never smoked  Substance and Sexual Activity  . Alcohol use: No  . Drug use: No  . Sexual activity: Not on file  Other Topics Concern  . Not on file  Social History Narrative   MARRIED, NO KIDS   Social Determinants of Health    Financial Resource Strain:   . Difficulty of Paying Living Expenses:   Food Insecurity:   . Worried About Charity fundraiser in the Last Year:   . Arboriculturist in the Last Year:   Transportation Needs:   . Film/video editor (Medical):   Marland Kitchen Lack of Transportation (Non-Medical):   Physical Activity:   . Days of Exercise per Week:   . Minutes of Exercise per Session:   Stress:   . Feeling of Stress :   Social Connections:   . Frequency of Communication with Friends and Family:   . Frequency of Social Gatherings with Friends and Family:   . Attends Religious Services:   . Active Member of Clubs or Organizations:   . Attends Archivist Meetings:   Marland Kitchen Marital Status:    Outpatient Encounter Medications as of 02/05/2020  Medication Sig  . Blood Glucose Monitoring Suppl (ONETOUCH VERIO) w/Device KIT 1 each by Does not apply route as needed.  . Continuous Blood Gluc Sensor (FREESTYLE LIBRE 14 DAY SENSOR) MISC USE AS DIRECTED EVERY 14 DAYS  . diclofenac (VOLTAREN) 75 MG EC tablet TK 1 T PO BID WF  . glipiZIDE (GLUCOTROL XL) 5 MG 24 hr tablet TAKE 1 TABLET(5 MG) BY MOUTH DAILY WITH  BREAKFAST  . glucose blood (ONETOUCH VERIO) test strip Use as instructed to test blood glucose four times daily  . Insulin Degludec (TRESIBA FLEXTOUCH) 200 UNIT/ML SOPN Inject 64 Units into the skin at bedtime.  . insulin lispro (HUMALOG KWIKPEN) 100 UNIT/ML KwikPen Inject 0.18 mLs (18 Units total) into the skin 3 (three) times daily.  . Insulin Pen Needle (B-D ULTRAFINE III SHORT PEN) 31G X 8 MM MISC 1 each by Does not apply route as directed.  . Lancets Thin MISC 1 each by Does not apply route 4 (four) times daily. Use to test blood glucose 4 times daily  . lisinopril (PRINIVIL,ZESTRIL) 40 MG tablet Take 40 mg by mouth daily.  . potassium chloride (K-DUR) 10 MEQ tablet Take 10 mEq by mouth daily.  Marland Kitchen torsemide (DEMADEX) 20 MG tablet Take 20 mg by mouth daily.  . Vitamin D, Ergocalciferol,  (DRISDOL) 1.25 MG (50000 UNIT) CAPS capsule TAKE 1 CAPSULE BY MOUTH EVERY 7 DAYS  . [DISCONTINUED] amLODipine (NORVASC) 5 MG tablet 5 mg daily.   No facility-administered encounter medications on file as of 02/05/2020.    ALLERGIES: No Known Allergies  VACCINATION STATUS:  There is no immunization history on file for this patient.  Diabetes She presents for her follow-up diabetic visit. She has type 2 diabetes mellitus. Onset time: She was diagnosed at approximate age of 37 years. Her disease course has been improving. There are no hypoglycemic associated symptoms. Pertinent negatives for hypoglycemia include no confusion, headaches, pallor or seizures. Pertinent negatives for diabetes include no chest pain, no fatigue, no polydipsia, no polyphagia and no polyuria. There are no hypoglycemic complications. Symptoms are improving. There are no diabetic complications. Risk factors for coronary artery disease include diabetes mellitus, dyslipidemia, family history, obesity, hypertension and sedentary lifestyle. Current diabetic treatments: She is taking toujeo 50-75 units twice a day,jardiance 25 mg by mouth daily. Her weight is increasing steadily (She weighed maximum was 279 pounds before her lap band surgery 2010 with subsequent loss of 117 pounds. Unfortunately she has regained almost all of that weight back to current weight of 335  pounds.  She is currently under consideration for sleeve gastrec). She is following a generally unhealthy diet. When asked about meal planning, she reported none. She has not had a previous visit with a dietitian. She rarely participates in exercise. There is no change in her home blood glucose trend. Her breakfast blood glucose range is generally 140-180 mg/dl. Her lunch blood glucose range is generally 140-180 mg/dl. Her dinner blood glucose range is generally 140-180 mg/dl. Her bedtime blood glucose range is generally 140-180 mg/dl. Her overall blood glucose range is  140-180 mg/dl. (She presents with her logs and CGM device.  Printouts reviewed in has 70% time in range, 30% above range.  She has no major hypoglycemia.  Her point-of-care A1c 7.5%, overall improving from 11.6%.) An ACE inhibitor/angiotensin II receptor blocker is being taken. Eye exam is current.  Hypertension This is a chronic problem. The current episode started more than 1 year ago. The problem is controlled. Pertinent negatives include no chest pain, headaches, palpitations or shortness of breath. Risk factors for coronary artery disease include diabetes mellitus, obesity, sedentary lifestyle, family history and dyslipidemia. Past treatments include ACE inhibitors.    Review of systems  Constitutional: + Minimally fluctuating body weight,  current  Body mass index is 54.2 kg/m. , no fatigue, no subjective hyperthermia, no subjective hypothermia Eyes: no blurry vision, no xerophthalmia ENT: no sore throat, no  nodules palpated in throat, no dysphagia/odynophagia, no hoarseness Cardiovascular: no Chest Pain, no Shortness of Breath, no palpitations, no leg swelling Respiratory: no cough, no shortness of breath Gastrointestinal: no Nausea/Vomiting/Diarhhea Musculoskeletal: no muscle/joint aches Skin: no rashes, no hyperemia Neurological: no tremors, no numbness, no tingling, no dizziness Psychiatric: no depression, no anxiety   Objective:    BP (!) 158/78   Pulse 81   Ht 5' 6"  (1.676 m)   Wt (!) 335 lb 12.8 oz (152.3 kg)   LMP 11/01/2015   BMI 54.20 kg/m   Wt Readings from Last 3 Encounters:  02/05/20 (!) 335 lb 12.8 oz (152.3 kg)  12/16/19 (!) 325 lb (147.4 kg)  12/11/19 (!) 327 lb 11.2 oz (148.6 kg)     Physical Exam- Limited  Constitutional:  Body mass index is 54.2 kg/m. , not in acute distress, normal state of mind Eyes:  EOMI, no exophthalmos Neck: Supple Thyroid: No gross goiter Respiratory: Adequate breathing efforts Musculoskeletal: no gross deformities, strength  intact in all four extremities, no gross restriction of joint movements Skin:  no rashes, no hyperemia Neurological: no tremor with outstretched hands,     CMP     Component Value Date/Time   NA 141 07/08/2019 0710   K 4.2 07/08/2019 0710   CL 104 07/08/2019 0710   CO2 29 07/08/2019 0710   GLUCOSE 147 (H) 07/08/2019 0710   BUN 11 07/08/2019 0710   BUN 12 05/01/2017 0000   CREATININE 0.67 07/08/2019 0710   CALCIUM 9.5 07/08/2019 0710   PROT 6.7 07/08/2019 0710   ALBUMIN 3.1 (L) 07/17/2013 0701   AST 12 07/08/2019 0710   ALT 13 07/08/2019 0710   ALKPHOS 74 07/17/2013 0701   BILITOT 0.4 07/08/2019 0710   GFRNONAA 100 07/08/2019 0710   GFRAA 116 07/08/2019 0710    Diabetic Labs (most recent): Lab Results  Component Value Date   HGBA1C 7.5 (A) 02/05/2020   HGBA1C 7.6 (A) 10/17/2019   HGBA1C 7.8 (H) 07/08/2019   Lipid Panel     Component Value Date/Time   CHOL 153 09/26/2018 0733   TRIG 124 09/26/2018 0733   HDL 59 09/26/2018 0733   CHOLHDL 2.6 09/26/2018 0733   VLDL 44 (H) 07/17/2013 0701   LDLCALC 73 09/26/2018 0733    Assessment & Plan:   1. Uncontrolled type 2 diabetes mellitus with hyperglycemia ([place]) - Patient has currently uncontrolled symptomatic type 2 DM since  54 years of age. - She came with improved and controlled and near target glycemic profile.    She presents with her logs and CGM device.  Her CGM printouts which are reviewed with her reviewed 70% time in range, 30% above range.  She has no major hypoglycemia.  Her point-of-care A1c 7.5%, overall improving from 11.6%.   -Recent labs reviewed.  -her diabetes is complicated by obesity/sedentary life and Acadia Thammavong remains at a high risk for more acute and chronic complications which include CAD, CVA, CKD, retinopathy, and neuropathy. These are all discussed in detail with the patient.  - I have counseled her on diet management and weight loss, by adopting a carbohydrate restricted/protein rich  diet.  - she  admits there is a room for improvement in her diet and drink choices. -  Suggestion is made for her to avoid simple carbohydrates  from her diet including Cakes, Sweet Desserts / Pastries, Ice Cream, Soda (diet and regular), Sweet Tea, Candies, Chips, Cookies, Sweet Pastries,  Store Bought Juices, Alcohol in Excess of  1-2 drinks a day, Artificial Sweeteners, Coffee Creamer, and "Sugar-free" Products. This will help patient to have stable blood glucose profile and potentially avoid unintended weight gain.   - I encouraged her to switch to  unprocessed or minimally processed complex starch and increased protein intake (animal or plant source), fruits, and vegetables.  - she is advised to stick to a routine mealtimes to eat 3 meals  a day and avoid unnecessary snacks ( to snack only to correct hypoglycemia).   - I have approached her with the following individualized plan to manage diabetes and patient agrees:   -   Based on her current glycemic profile, she will continue to need intensive treatment with basal/bolus insulin in order for her to maintain control of diabetes to target.    -She is advised to increase Tresiba to 64 units nightly , continue Humalog 18  units 3 times a day before meals for pre-meal blood glucose above 90 mg/dL (with additional correction for pre-meal blood glucose above 150 mg/dL) associated with documenting of glucose 4 times a day-before meals and at bedtime. - Patient is warned not to take insulin without proper monitoring per orders. -Patient is encouraged to call clinic for blood glucose levels less than 70 or above 300 mg /dl. - She is benefiting from continuous glucose monitoring. -She is being considered for sleeve gastrectomy, advised to return to clinic within the week of her discharge to adjust her insulin treatment and she is expected to require much less dose of insulin after surgery.  - She did not tolerate metformin causing GI side effects.   -She did not tolerate Trulicity which was stopped since her last visit.   -She has benefited from low-dose glipizide therapy.  She is advised to continue glipizide 5 mg XL p.o. daily at breakfast.  - Patient specific target  A1c;  LDL, HDL, Triglycerides, were discussed in detail.  2) BP/HTN: Her blood pressure is not controlled to target.  She is advised to continue her current blood pressure medications including lisinopril 40 mg p.o. daily.    3) Lipids/HPL: Her recent lipid panel showed controlled LDL at 53, triglycerides improving to 132 from  222.   Patient is not on  Statins, hesitates to go on statins, will be considered for low-dose statins on subsequent visits.  4)  Weight/Diet: CDE Consult has been initiated , exercise, and detailed carbohydrates information provided. - Patient reports history of weighing  279 pounds in the past which led to a LAP-BAND weight loss surgery in 2010 which help her lose 117 pounds. - Subsequently she has regained almost all of that weight back to current weight of 335 pounds.  She is currently under evaluation for sleeve gastrectomy with Dr. Hassell Done.  She is encouraged to continue close follow-up with them and encouraged to pursue this option.    5) vitamin D deficiency: She is on ongoing treatment with vitamin D2 50,000 units weekly, advised to continue to finish.    6) Chronic Care/Health Maintenance:  -she  is on ACEI/ARB  medications and  is encouraged to continue to follow up with Ophthalmology, Dentist,  Podiatrist at least yearly or according to recommendations, and advised to  stay away from smoking. I have recommended yearly flu vaccine and pneumonia vaccination at least every 5 years; moderate intensity exercise for up to 150 minutes weekly; and  sleep for at least 7 hours a day.  - I advised patient to maintain close follow up with Caryl Bis,  MD for primary care needs.  - Time spent on this patient care encounter:  35 min, of which >  50% was spent in  counseling and the rest reviewing her blood glucose logs , discussing her hypoglycemia and hyperglycemia episodes, reviewing her current and  previous labs / studies  ( including abstraction from other facilities) and medications  doses and developing a  long term treatment plan and documenting her care.   Please refer to Patient Instructions for Blood Glucose Monitoring and Insulin/Medications Dosing Guide"  in media tab for additional information. Please  also refer to " Patient Self Inventory" in the Media  tab for reviewed elements of pertinent patient history.  [name] participated in the discussions, expressed understanding, and voiced agreement with the above plans.  All questions were answered to her satisfaction. she is encouraged to contact clinic should she have any questions or concerns prior to her return visit.   Follow up plan: - Return in about 3 months (around 05/07/2020) for F/U with Pre-visit Labs, Meter, Logs, A1c here.Glade Lloyd, MD Phone: [phone]  Fax: [phone]   02/05/2020, 4:10 PM This note was partially dictated with voice recognition software. Similar sounding words can be transcribed inadequately or may not  be corrected upon review.

## 2020-02-07 ENCOUNTER — Ambulatory Visit: Payer: Managed Care, Other (non HMO)

## 2020-02-24 ENCOUNTER — Other Ambulatory Visit: Payer: Self-pay

## 2020-02-24 DIAGNOSIS — E1165 Type 2 diabetes mellitus with hyperglycemia: Secondary | ICD-10-CM

## 2020-02-24 MED ORDER — TRESIBA FLEXTOUCH 200 UNIT/ML ~~LOC~~ SOPN: 64.0000 [IU] | PEN_INJECTOR | Freq: Every day | SUBCUTANEOUS | 2 refills | Status: DC

## 2020-03-03 ENCOUNTER — Other Ambulatory Visit: Payer: Self-pay | Admitting: "Endocrinology

## 2020-03-17 ENCOUNTER — Other Ambulatory Visit: Payer: Self-pay | Admitting: "Endocrinology

## 2020-03-21 ENCOUNTER — Other Ambulatory Visit: Payer: Self-pay | Admitting: "Endocrinology

## 2020-04-03 ENCOUNTER — Other Ambulatory Visit: Payer: Self-pay | Admitting: "Endocrinology

## 2020-04-14 ENCOUNTER — Other Ambulatory Visit: Payer: Self-pay | Admitting: "Endocrinology

## 2020-04-15 ENCOUNTER — Telehealth: Payer: Self-pay | Admitting: "Endocrinology

## 2020-04-15 NOTE — Telephone Encounter (Signed)
Patient aware.

## 2020-04-15 NOTE — Telephone Encounter (Signed)
Pt called and canceled her appt for 8/24 as she is having bariatric surgery 8/23. She did not reschedule because she is unsure if Dr. Fransico Him wants to see her before or after her surgery. Please advise

## 2020-04-15 NOTE — Telephone Encounter (Signed)
I spoke with patient and advised, will she need to do her lab work that you had ordered prior to coming in for appt after surgery?

## 2020-04-15 NOTE — Telephone Encounter (Signed)
She will likely be discharged with much less insulin after her surgery. I do want her back 1 week after d/c with her meter and logs.

## 2020-04-15 NOTE — Telephone Encounter (Signed)
Please advise 

## 2020-04-15 NOTE — Telephone Encounter (Signed)
No. They will likely do labs at surgery, we will just do a1c if she is due at that time.

## 2020-04-23 ENCOUNTER — Telehealth: Payer: Self-pay | Admitting: Skilled Nursing Facility1

## 2020-04-23 NOTE — Telephone Encounter (Signed)
Dietitian called pt to assess their understanding of the pre-op nutrition recommendations through the teach back method to ensure the pts knowledge readiness in preparation for surgery.    LVM 

## 2020-05-05 ENCOUNTER — Telehealth: Payer: Self-pay | Admitting: Skilled Nursing Facility1

## 2020-05-05 NOTE — Telephone Encounter (Signed)
Dietitian called pt to assess their understanding of the pre-op nutrition recommendations through the teach back method to ensure the pts knowledge readiness in preparation for surgery.    Pt needed some education all questisons answered to her satisfaction.

## 2020-05-06 NOTE — Progress Notes (Signed)
Call was made to notify Dr. Ermalene Searing office staff about the need for orders, for the upcomming surgery.RN was told that Dr. Daphine Deutscher will be off from office this week,but they will sent a text message.

## 2020-05-06 NOTE — Patient Instructions (Addendum)
DUE TO COVID-19 ONLY ONE VISITOR IS ALLOWED TO COME WITH YOU AND STAY IN THE WAITING ROOM ONLY DURING PRE OP AND PROCEDURE DAY OF SURGERY. THE 1 VISITOR  MAY VISIT WITH YOU AFTER SURGERY IN YOUR PRIVATE ROOM DURING VISITING HOURS ONLY!  YOU NEED TO HAVE A COVID 19 TEST ON: 05/07/20 , THIS TEST MUST BE DONE BEFORE SURGERY,  COVID TESTING SITE [address] [place] [address], IT IS ON THE RIGHT GOING OUT [address] APPROXIMATELY  2 MINUTES PAST [employer] ON THE RIGHT. ONCE YOUR COVID TEST IS COMPLETED,  PLEASE BEGIN THE QUARANTINE INSTRUCTIONS AS OUTLINED IN YOUR HANDOUT.                [name] [name]   Your procedure is scheduled on: 05/11/20   Report to [facility] Main  Entrance   Report to admitting at: 9:30 AM     Call this number if you have problems the morning of surgery [phone]    Remember: NO SOLID FOOD AFTER MIDNIGHT THE NIGHT PRIOR TO SURGERY. NOTHING BY MOUTH EXCEPT CLEAR LIQUIDS UNTIL: 8:30 am . PLEASE FINISH GATORADE DRINK PER SURGEON ORDER  WHICH NEEDS TO BE COMPLETED AT: 8:30 am .  CLEAR LIQUID DIET   Foods Allowed                                                                     Foods Excluded  Coffee and tea, regular and decaf                             liquids that you cannot  Plain Jell-O any favor except red or purple                                           see through such as: Fruit ices (not with fruit pulp)                                     milk, soups, orange juice  Iced Popsicles                                    All solid food Carbonated beverages, regular and diet                                    Cranberry, grape and apple juices Sports drinks like Gatorade Lightly seasoned clear broth or consume(fat free) Sugar, honey syrup  Sample Menu Breakfast                                Lunch  Supper Cranberry juice                    Beef broth                            Chicken  broth Jell-O                                     Grape juice                           Apple juice Coffee or tea                        Jell-O                                      Popsicle                                                Coffee or tea                        Coffee or tea  _____________________________________________________________________  BRUSH YOUR TEETH MORNING OF SURGERY AND RINSE YOUR MOUTH OUT, NO CHEWING GUM CANDY OR MINTS.     Take these medicines the morning of surgery with A SIP OF WATER: duloxetine,metoprolol.  How to Manage Your Diabetes Before and After Surgery  Why is it important to control my blood sugar before and after surgery?  Improving blood sugar levels before and after surgery helps healing and can limit problems.  A way of improving blood sugar control is eating a healthy diet by: o  Eating less sugar and carbohydrates o  Increasing activity/exercise o  Talking with your doctor about reaching your blood sugar goals  High blood sugars (greater than 180 mg/dL) can raise your risk of infections and slow your recovery, so you will need to focus on controlling your diabetes during the weeks before surgery.  Make sure that the doctor who takes care of your diabetes knows about your planned surgery including the date and location.  How do I manage my blood sugar before surgery?  Check your blood sugar at least 4 times a day, starting 2 days before surgery, to make sure that the level is not too high or low. o Check your blood sugar the morning of your surgery when you wake up and every 2 hours until you get to the Short Stay unit.  If your blood sugar is less than 70 mg/dL, you will need to treat for low blood sugar: o Do not take insulin. o Treat a low blood sugar (less than 70 mg/dL) with  cup of clear juice (cranberry or apple), 4 glucose tablets, OR glucose gel. o Recheck blood sugar in 15 minutes after treatment (to make sure it is greater  than 70 mg/dL). If your blood sugar is not greater than 70 mg/dL on recheck, call [phone] for further instructions.  Report your blood sugar to the short stay nurse when you get to Short Stay.   If you are  admitted to the hospital after surgery: o Your blood sugar will be checked by the staff and you will probably be given insulin after surgery (instead of oral diabetes medicines) to make sure you have good blood sugar levels. o The goal for blood sugar control after surgery is 80-180 mg/dL.   WHAT DO I DO ABOUT MY DIABETES MEDICATION?   Do not take oral diabetes medicines (pills) the morning of surgery.   THE DAY BEFORE SURGERY, take Glipizide as usual. Take breakfast and lunch dose of Lispro(Humalog) insulin. DO NOT take the bed time dose.   DO NOT take Tresiba insulin the night before surgery.        THE MORNING OF SURGERY:   If your CBG is greater than 220 mg/dL, you may take  of your sliding scale   (correction) dose of insulin.    DO NOT TAKE ANY DIABETIC MEDICATIONS DAY OF YOUR SURGERY                               You may not have any metal on your body including hair pins and              piercings  Do not wear jewelry, make-up, lotions, powders or perfumes, deodorant             Do not wear nail polish on your fingernails.  Do not shave  48 hours prior to surgery.              Do not bring valuables to the hospital. [place] IS NOT             RESPONSIBLE   FOR VALUABLES.  Contacts, dentures or bridgework may not be worn into surgery.  Leave suitcase in the car. After surgery it may be brought to your room.     Patients discharged the day of surgery will not be allowed to drive home. IF YOU ARE HAVING SURGERY AND GOING HOME THE SAME DAY, YOU MUST HAVE AN ADULT TO DRIVE YOU HOME AND BE WITH YOU FOR 24 HOURS. YOU MAY GO HOME BY TAXI OR UBER OR ORTHERWISE, BUT AN ADULT MUST ACCOMPANY YOU HOME AND STAY WITH YOU FOR 24 HOURS.  Name and phone number of your  driver:  Special Instructions: N/A              Please read over the following fact sheets you were given: _____________________________________________________________________         [facility] Pre[facility] Before surgery, you can play an important role.  Because skin is not sterile, your skin needs to be as free of germs as possible.  You can reduce the number of germs on your skin by washing with CHG (chlorahexidine gluconate) soap before surgery.  CHG is an antiseptic cleaner which kills germs and bonds with the skin to continue killing germs even after washing. Please DO NOT use if you have an allergy to CHG or antibacterial soaps.  If your skin becomes reddened/irritated stop using the CHG and inform your nurse when you arrive at Short Stay. Do not shave (including legs and underarms) for at least 48 hours prior to the first CHG shower.  You may shave your face/neck. Please follow these instructions carefully:  1.  Shower with CHG Soap the night before surgery and the  morning of Surgery.  2.  If you choose to wash your hair, wash your  hair first as usual with your  normal  shampoo.  3.  After you shampoo, rinse your hair and body thoroughly to remove the  shampoo.                           4.  Use CHG as you would any other liquid soap.  You can apply chg directly  to the skin and wash                       Gently with a scrungie or clean washcloth.  5.  Apply the CHG Soap to your body ONLY FROM THE NECK DOWN.   Do not use on face/ open                           Wound or open sores. Avoid contact with eyes, ears mouth and genitals (private parts).                       Wash face,  Genitals (private parts) with your normal soap.             6.  Wash thoroughly, paying special attention to the area where your surgery  will be performed.  7.  Thoroughly rinse your body with warm water from the neck down.  8.  DO NOT shower/wash with your normal soap after using and rinsing off   the CHG Soap.                9.  Pat yourself dry with a clean towel.            10.  Wear clean pajamas.            11.  Place clean sheets on your bed the night of your first shower and do not  sleep with pets. Day of Surgery : Do not apply any lotions/deodorants the morning of surgery.  Please wear clean clothes to the hospital/surgery center.  FAILURE TO FOLLOW THESE INSTRUCTIONS MAY RESULT IN THE CANCELLATION OF YOUR SURGERY PATIENT SIGNATURE_________________________________  NURSE SIGNATURE__________________________________  ________________________________________________________________________

## 2020-05-07 ENCOUNTER — Encounter (HOSPITAL_COMMUNITY): Payer: Self-pay

## 2020-05-07 ENCOUNTER — Other Ambulatory Visit (HOSPITAL_COMMUNITY)
Admission: RE | Admit: 2020-05-07 | Discharge: 2020-05-07 | Disposition: A | Discharge status: Home / Self Care | Payer: Managed Care, Other (non HMO) | Source: Ambulatory Visit | Attending: Surgery | Admitting: Surgery

## 2020-05-07 ENCOUNTER — Other Ambulatory Visit: Payer: Self-pay

## 2020-05-07 ENCOUNTER — Ambulatory Visit: Payer: Self-pay | Admitting: Surgery

## 2020-05-07 ENCOUNTER — Encounter (HOSPITAL_COMMUNITY)
Admission: RE | Admit: 2020-05-07 | Discharge: 2020-05-07 | Disposition: A | Discharge status: Home / Self Care | Payer: Managed Care, Other (non HMO) | Source: Ambulatory Visit | Attending: General Surgery | Admitting: General Surgery

## 2020-05-07 DIAGNOSIS — Z01812 Encounter for preprocedural laboratory examination: Secondary | ICD-10-CM | POA: Diagnosis present

## 2020-05-07 DIAGNOSIS — Z01818 Encounter for other preprocedural examination: Secondary | ICD-10-CM | POA: Insufficient documentation

## 2020-05-07 DIAGNOSIS — Z20822 Contact with and (suspected) exposure to covid-19: Secondary | ICD-10-CM | POA: Diagnosis not present

## 2020-05-07 HISTORY — DX: Dyspnea, unspecified: R06.00

## 2020-05-07 LAB — CBC WITH DIFFERENTIAL/PLATELET
Abs Immature Granulocytes: 0.02 10*3/uL (ref 0.00–0.07)
Basophils Absolute: 0.1 10*3/uL (ref 0.0–0.1)
Basophils Relative: 1 %
Eosinophils Absolute: 0.3 10*3/uL (ref 0.0–0.5)
Eosinophils Relative: 3 %
HCT: 38.5 % (ref 36.0–46.0)
Hemoglobin: 11.8 g/dL — ABNORMAL LOW (ref 12.0–15.0)
Immature Granulocytes: 0 %
Lymphocytes Relative: 22 %
Lymphs Abs: 2.5 10*3/uL (ref 0.7–4.0)
MCH: 27.5 pg (ref 26.0–34.0)
MCHC: 30.6 g/dL (ref 30.0–36.0)
MCV: 89.7 fL (ref 80.0–100.0)
Monocytes Absolute: 0.7 10*3/uL (ref 0.1–1.0)
Monocytes Relative: 7 %
Neutro Abs: 7.5 10*3/uL (ref 1.7–7.7)
Neutrophils Relative %: 67 %
Platelets: 230 10*3/uL (ref 150–400)
RBC: 4.29 MIL/uL (ref 3.87–5.11)
RDW: 13.4 % (ref 11.5–15.5)
WBC: 11.1 10*3/uL — ABNORMAL HIGH (ref 4.0–10.5)
nRBC: 0 % (ref 0.0–0.2)

## 2020-05-07 LAB — COMPREHENSIVE METABOLIC PANEL
ALT: 20 U/L (ref 0–44)
AST: 18 U/L (ref 15–41)
Albumin: 3.5 g/dL (ref 3.5–5.0)
Alkaline Phosphatase: 88 U/L (ref 38–126)
Anion gap: 9 (ref 5–15)
BUN: 12 mg/dL (ref 6–20)
CO2: 28 mmol/L (ref 22–32)
Calcium: 8.9 mg/dL (ref 8.9–10.3)
Chloride: 103 mmol/L (ref 98–111)
Creatinine, Ser: 0.69 mg/dL (ref 0.44–1.00)
GFR calc Af Amer: >60 mL/min (ref >60)
GFR calc non Af Amer: >60 mL/min (ref >60)
Glucose, Bld: 213 mg/dL — ABNORMAL HIGH (ref 70–99)
Potassium: 4.3 mmol/L (ref 3.5–5.1)
Sodium: 140 mmol/L (ref 135–145)
Total Bilirubin: 0.5 mg/dL (ref 0.3–1.2)
Total Protein: 6.7 g/dL (ref 6.5–8.1)

## 2020-05-07 LAB — SARS CORONAVIRUS 2 (TAT 6-24 HRS): SARS Coronavirus 2: NEGATIVE

## 2020-05-07 LAB — HEMOGLOBIN A1C
Hgb A1c MFr Bld: 7.4 % — ABNORMAL HIGH (ref 4.8–5.6)
Mean Plasma Glucose: 165.68 mg/dL

## 2020-05-07 LAB — GLUCOSE, CAPILLARY: Glucose-Capillary: 243 mg/dL — ABNORMAL HIGH (ref 70–99)

## 2020-05-07 NOTE — Progress Notes (Signed)
COVID Vaccine Completed:yes Date COVID Vaccine completed:11/29/19 COVID vaccine manufacturer: Pfizer    Moderna   *[employer]   PCP - Dr. Donzetta Sprung  Cardiologist - No  Chest x-ray -  EKG -  Stress Test -  ECHO -  Cardiac Cath -  A1C: 7.5: 02/02/20 Sleep Study - NO CPAP - NO  Fasting Blood Sugar - 120's Checks Blood Sugar ___4__ times a day  Blood Thinner Instructions: Aspirin Instructions: Last Dose:  Anesthesia review: Hx: DIA,HTN.   Patient denies shortness of breath, fever, cough and chest pain at PAT appointment   Patient verbalized understanding of instructions that were given to them at the PAT appointment. Patient was also instructed that they will need to review over the PAT instructions again at home before surgery.

## 2020-05-10 MED ORDER — BUPIVACAINE LIPOSOME 1.3 % IJ SUSP
20.0000 mL | Freq: Once | INTRAMUSCULAR | Status: DC
  Filled 2020-05-10: qty 20

## 2020-05-10 NOTE — H&P (Signed)
Chief Complaint:  lapband dysfunction  History of Present Illness:  [name] [name] is an 54 y.o. female who had a lapband placed by Dr. Zettie Pho.  Despite numerous attempts, optimal restriction with the lapband without dysfunction has not been achieved.  She presents for removal of her lapband and conversion to sleeve gastrectomy.    Past Medical History:  Diagnosis Date  . Crohn's colitis ([place])   . Diverticulosis OCT 2011    PAN-COLONIC  . DM (diabetes mellitus) ([place])   . Dyspnea    On exertion  . HTN (hypertension)   . Hyperlipemia   . 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 Obesity, Class III, BMI 40-49.9 (morbid obesity) ([place]) AUG 2010 305 LBS   MAY 2012 267  . Vitamin D deficiency     Past Surgical History:  Procedure Laterality Date  . ABDOMINAL EXPLORATION SURGERY  May 05, 2009 MM   INFLAMMED TI (SEROSITIS), "DIDN'T LOOK LIKE CROHN'S"  . CHOLECYSTECTOMY    . COLONOSCOPY  DEC 2010 WO  . COLONOSCOPY  OCT 2011   MILD ERYTHEMA IN TI (30-40 CM), NL SB/COLON Bx  . ESOPHAGOGASTRODUODENOSCOPY N/A 04/14/2014   Procedure: ESOPHAGOGASTRODUODENOSCOPY (EGD);  Surgeon: [name] Binder, MD;  Location: [facility] ENDO SUITE;  Service: Endoscopy;  Laterality: N/A;  8:30 AM  . LAPAROSCOPIC GASTRIC BANDING  AUG 2010 BMI 50   GASTRIC POUCH 2-3 CM  . UMBILICAL HERNIA REPAIR  AUG 2010 [name] [name]   INCARCERATED OMENTUM  . UPPER GASTROINTESTINAL ENDOSCOPY  MAY 2012 GIVENS CAPSULE PLACEMENT   GASTRITIS    Current Facility-Administered Medications  Medication Dose Route Frequency Provider Last Rate Last Admin  . [START ON 05/11/2020] bupivacaine liposome (EXPAREL) 1.3 % injection 266 mg  20 mL Infiltration Once [name] Hausen, MD       Current Outpatient Medications  Medication Sig Dispense Refill  . atorvastatin (LIPITOR) 10 MG tablet Take 10 mg by mouth daily.    . diclofenac (VOLTAREN) 50 MG EC tablet Take 50 mg by mouth 2 (two) times daily.   1  . DULoxetine  (CYMBALTA) 30 MG capsule Take 30 mg by mouth daily.    Marland Kitchen gabapentin (NEURONTIN) 300 MG capsule Take 600 mg by mouth at bedtime.    Marland Kitchen glipiZIDE (GLUCOTROL XL) 5 MG 24 hr tablet TAKE 1 TABLET(5 MG) BY MOUTH DAILY WITH BREAKFAST (Patient taking differently: Take 5 mg by mouth daily with breakfast. ) 30 tablet 5  . insulin degludec (TRESIBA FLEXTOUCH) 200 UNIT/ML FlexTouch Pen Inject 64 Units into the skin at bedtime. 4 pen 2  . insulin lispro (HUMALOG) 100 UNIT/ML KwikPen INJECT 18-24 UNITS INTO THE SKIN THREE TIMES DAILY BEFORE A MEAL (Patient taking differently: Inject 18-24 Units into the skin 3 (three) times daily before meals. ) 63 mL 1  . lisinopril (PRINIVIL,ZESTRIL) 40 MG tablet Take 40 mg by mouth daily.    . metoprolol succinate (TOPROL-XL) 25 MG 24 hr tablet Take 25 mg by mouth daily.    . potassium chloride (K-DUR) 10 MEQ tablet Take 10 mEq by mouth daily as needed (Take only whe take Torsemide).     . torsemide (DEMADEX) 20 MG tablet Take 20 mg by mouth daily as needed (Excess fluid).    . Vitamin D, Ergocalciferol, (DRISDOL) 1.25 MG (50000 UNIT) CAPS capsule TAKE 1 CAPSULE BY MOUTH EVERY 7 DAYS (Patient taking differently: Take 50,000 Units by mouth every 7 (seven) days. ) 12 capsule 0  .  Blood Glucose Monitoring Suppl (ONETOUCH VERIO) w/Device KIT 1 each by Does not apply route as needed. 1 kit 0  . Continuous Blood Gluc Sensor (FREESTYLE LIBRE 14 DAY SENSOR) MISC USE AS DIRECTED EVERY 14 DAYS 2 each 2  . glucose blood (ONETOUCH VERIO) test strip USE AS INSTRUCTED TO TEST BLOOD SUGAR FOUR TIMES DAILY 200 strip 1  . Insulin Pen Needle (B-D ULTRAFINE III SHORT PEN) 31G X 8 MM MISC 1 each by Does not apply route as directed. 1000 each 1  . Lancets Thin MISC 1 each by Does not apply route 4 (four) times daily. Use to test blood glucose 4 times daily 200 each 1   Patient has no known allergies. Family History  Problem Relation Age of Onset  . Diabetes Mother   . Diabetes Father   .  Diabetes Other   . Colon cancer Neg Hx   . Colon polyps Neg Hx   . Stomach cancer Neg Hx    Social History:   reports that she has never smoked. She has never used smokeless tobacco. She reports that she does not drink alcohol and does not use drugs.   REVIEW OF SYSTEMS : Negative except for see problem list  Physical Exam:   Last menstrual period 11/01/2015. There is no height or weight on file to calculate BMI.  Gen:  WDWN WF NAD  Neurological: Alert and oriented to person, place, and time. Motor and sensory function is grossly intact  Head: Normocephalic and atraumatic.  Eyes: Conjunctivae are normal. Pupils are equal, round, and reactive to light. No scleral icterus.  Neck: Normal range of motion. Neck supple. No tracheal deviation or thyromegaly present.  Cardiovascular:  SR without murmurs or gallops.  No carotid bruits Breast:  Not examined Respiratory: Effort normal.  No respiratory distress. No chest wall tenderness. Breath sounds normal.  No wheezes, rales or rhonchi.  Abdomen:  Port in the right upper quadrant GU:  unremarkable Musculoskeletal: Normal range of motion. Extremities are nontender. No cyanosis, edema or clubbing noted Lymphadenopathy: No cervical, preauricular, postauricular or axillary adenopathy is present Skin: Skin is warm and dry. No rash noted. No diaphoresis. No erythema. No pallor. Pscyh: Normal mood and affect. Behavior is normal. Judgment and thought content normal.   LABORATORY RESULTS: No results found for this or any previous visit (from the past 48 hour(s)).   RADIOLOGY RESULTS: No results found.  Problem List: Patient Active Problem List   Diagnosis Date Noted  . Vitamin D deficiency 09/14/2017  . Uncontrolled type 2 diabetes mellitus with hyperglycemia ([place]) 06/20/2017  . Essential hypertension, benign 06/20/2017  . Hypokalemia 07/18/2013  . Nausea vomiting and diarrhea 07/17/2013  . Abdominal pain 07/17/2013  . Enteritis 07/17/2013   . Hyponatremia 07/17/2013  . Leukocytosis 07/17/2013  . Hyperglycemia 07/17/2013  . Tachycardia 07/17/2013  . DM (diabetes mellitus) ([place])   . HTN (hypertension)   . Hyperlipemia   . Morbid obesity ([place])   . 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  For band removal and conversion to sleeve gastrectomy.     Matt B. Hassell Done, MD, [facility] [phone] beeper [phone]  05/10/2020 3:23 PM

## 2020-05-11 ENCOUNTER — Encounter (HOSPITAL_COMMUNITY): Payer: Self-pay | Admitting: Surgery

## 2020-05-11 ENCOUNTER — Inpatient Hospital Stay (HOSPITAL_COMMUNITY)
Admission: RE | Admit: 2020-05-11 | Discharge: 2020-05-13 | DRG: 327 | Disposition: A | Discharge status: Home / Self Care | Payer: Managed Care, Other (non HMO) | Source: Ambulatory Visit | Attending: Surgery | Admitting: Surgery

## 2020-05-11 ENCOUNTER — Other Ambulatory Visit: Payer: Self-pay

## 2020-05-11 ENCOUNTER — Inpatient Hospital Stay (HOSPITAL_COMMUNITY): Payer: Managed Care, Other (non HMO) | Admitting: Anesthesiology

## 2020-05-11 ENCOUNTER — Encounter (HOSPITAL_COMMUNITY)
Admission: RE | Disposition: A | Discharge status: Home / Self Care | Payer: Self-pay | Source: Ambulatory Visit | Attending: Surgery

## 2020-05-11 ENCOUNTER — Inpatient Hospital Stay (HOSPITAL_COMMUNITY): Payer: Managed Care, Other (non HMO) | Admitting: Physician Assistant

## 2020-05-11 DIAGNOSIS — Z6841 Body Mass Index (BMI) 40.0 and over, adult: Secondary | ICD-10-CM | POA: Diagnosis not present

## 2020-05-11 DIAGNOSIS — Z791 Long term (current) use of non-steroidal anti-inflammatories (NSAID): Secondary | ICD-10-CM | POA: Diagnosis not present

## 2020-05-11 DIAGNOSIS — I10 Essential (primary) hypertension: Secondary | ICD-10-CM | POA: Diagnosis present

## 2020-05-11 DIAGNOSIS — K9509 Other complications of gastric band procedure: Principal | ICD-10-CM | POA: Diagnosis present

## 2020-05-11 DIAGNOSIS — E785 Hyperlipidemia, unspecified: Secondary | ICD-10-CM | POA: Diagnosis present

## 2020-05-11 DIAGNOSIS — Z20822 Contact with and (suspected) exposure to covid-19: Secondary | ICD-10-CM | POA: Diagnosis present

## 2020-05-11 DIAGNOSIS — Z794 Long term (current) use of insulin: Secondary | ICD-10-CM

## 2020-05-11 DIAGNOSIS — Z9884 Bariatric surgery status: Secondary | ICD-10-CM

## 2020-05-11 DIAGNOSIS — E119 Type 2 diabetes mellitus without complications: Secondary | ICD-10-CM | POA: Diagnosis present

## 2020-05-11 DIAGNOSIS — Z79899 Other long term (current) drug therapy: Secondary | ICD-10-CM | POA: Diagnosis not present

## 2020-05-11 HISTORY — PX: LAPAROSCOPIC GASTRIC SLEEVE RESECTION: SHX5895

## 2020-05-11 HISTORY — PX: ESOPHAGOGASTRODUODENOSCOPY: SHX5428

## 2020-05-11 LAB — CBC
HCT: 38 % (ref 36.0–46.0)
Hemoglobin: 12 g/dL (ref 12.0–15.0)
MCH: 28.2 pg (ref 26.0–34.0)
MCHC: 31.6 g/dL (ref 30.0–36.0)
MCV: 89.4 fL (ref 80.0–100.0)
Platelets: 253 10*3/uL (ref 150–400)
RBC: 4.25 MIL/uL (ref 3.87–5.11)
RDW: 13.6 % (ref 11.5–15.5)
WBC: 15.7 10*3/uL — ABNORMAL HIGH (ref 4.0–10.5)
nRBC: 0 % (ref 0.0–0.2)

## 2020-05-11 LAB — TYPE AND SCREEN
ABO/RH(D): A POS
Antibody Screen: NEGATIVE

## 2020-05-11 LAB — GLUCOSE, CAPILLARY
Glucose-Capillary: 235 mg/dL — ABNORMAL HIGH (ref 70–99)
Glucose-Capillary: 164 mg/dL — ABNORMAL HIGH (ref 70–99)
Glucose-Capillary: 177 mg/dL — ABNORMAL HIGH (ref 70–99)
Glucose-Capillary: 213 mg/dL — ABNORMAL HIGH (ref 70–99)

## 2020-05-11 LAB — ABO/RH: ABO/RH(D): A POS

## 2020-05-11 LAB — CREATININE, SERUM
Creatinine, Ser: 0.74 mg/dL (ref 0.44–1.00)
GFR calc Af Amer: >60 mL/min (ref >60)
GFR calc non Af Amer: >60 mL/min (ref >60)

## 2020-05-11 LAB — PREGNANCY, URINE: Preg Test, Ur: NEGATIVE

## 2020-05-11 SURGERY — GASTRECTOMY, SLEEVE, LAPAROSCOPIC
Anesthesia: General

## 2020-05-11 MED ORDER — AMISULPRIDE (ANTIEMETIC) 5 MG/2ML IV SOLN
INTRAVENOUS | Status: AC
  Filled 2020-05-11: qty 4

## 2020-05-11 MED ORDER — LIDOCAINE 2% (20 MG/ML) 5 ML SYRINGE
INTRAMUSCULAR | Status: DC | PRN
  Administered 2020-05-11: 50 mg via INTRAVENOUS

## 2020-05-11 MED ORDER — HEPARIN SODIUM (PORCINE) 5000 UNIT/ML IJ SOLN
5000.0000 [IU] | Freq: Three times a day (TID) | INTRAMUSCULAR | Status: DC
  Administered 2020-05-11 – 2020-05-13 (×5): 5000 [IU] via SUBCUTANEOUS
  Filled 2020-05-11 (×5): qty 1

## 2020-05-11 MED ORDER — PROMETHAZINE HCL 25 MG/ML IJ SOLN: 6.2500 mg | INTRAMUSCULAR | Status: DC | PRN

## 2020-05-11 MED ORDER — CHLORHEXIDINE GLUCONATE CLOTH 2 % EX PADS: 6.0000 | MEDICATED_PAD | Freq: Once | CUTANEOUS | Status: DC

## 2020-05-11 MED ORDER — ACETAMINOPHEN 160 MG/5ML PO SOLN
1000.0000 mg | Freq: Three times a day (TID) | ORAL | Status: DC
  Administered 2020-05-12 – 2020-05-13 (×3): 1000 mg via ORAL
  Filled 2020-05-11 (×3): qty 40.6

## 2020-05-11 MED ORDER — ENSURE MAX PROTEIN PO LIQD
2.0000 [oz_av] | ORAL | Status: DC
  Administered 2020-05-12 – 2020-05-13 (×14): 2 [oz_av] via ORAL

## 2020-05-11 MED ORDER — FENTANYL CITRATE (PF) 250 MCG/5ML IJ SOLN
INTRAMUSCULAR | Status: AC
  Filled 2020-05-11: qty 5

## 2020-05-11 MED ORDER — MORPHINE SULFATE (PF) 4 MG/ML IV SOLN
1.0000 mg | INTRAVENOUS | Status: DC | PRN
  Administered 2020-05-11 – 2020-05-12 (×2): 2 mg via INTRAVENOUS
  Filled 2020-05-11 (×2): qty 1

## 2020-05-11 MED ORDER — SODIUM CHLORIDE 0.9 % IV SOLN
2.0000 g | INTRAVENOUS | Status: AC
  Administered 2020-05-11: 2 g via INTRAVENOUS
  Filled 2020-05-11: qty 2

## 2020-05-11 MED ORDER — CHLORHEXIDINE GLUCONATE 0.12 % MT SOLN
15.0000 mL | Freq: Once | OROMUCOSAL | Status: AC
  Administered 2020-05-11: 15 mL via OROMUCOSAL

## 2020-05-11 MED ORDER — GABAPENTIN 300 MG PO CAPS
300.0000 mg | ORAL_CAPSULE | ORAL | Status: AC
  Administered 2020-05-11: 300 mg via ORAL
  Filled 2020-05-11: qty 1

## 2020-05-11 MED ORDER — HYDROMORPHONE HCL 1 MG/ML IJ SOLN
0.2500 mg | INTRAMUSCULAR | Status: DC | PRN
  Administered 2020-05-11: 0.5 mg via INTRAVENOUS

## 2020-05-11 MED ORDER — PROPOFOL 10 MG/ML IV BOLUS
INTRAVENOUS | Status: AC
  Filled 2020-05-11: qty 20

## 2020-05-11 MED ORDER — ACETAMINOPHEN 500 MG PO TABS
1000.0000 mg | ORAL_TABLET | Freq: Three times a day (TID) | ORAL | Status: DC
  Administered 2020-05-11: 22:00:00 1000 mg via ORAL
  Filled 2020-05-11 (×2): qty 2

## 2020-05-11 MED ORDER — PROPOFOL 10 MG/ML IV BOLUS
INTRAVENOUS | Status: DC | PRN
  Administered 2020-05-11: 200 mg via INTRAVENOUS

## 2020-05-11 MED ORDER — SCOPOLAMINE 1 MG/3DAYS TD PT72
1.0000 | MEDICATED_PATCH | TRANSDERMAL | Status: DC
  Administered 2020-05-11: 1.5 mg via TRANSDERMAL
  Filled 2020-05-11: qty 1

## 2020-05-11 MED ORDER — BUPIVACAINE LIPOSOME 1.3 % IJ SUSP
INTRAMUSCULAR | Status: DC | PRN
  Administered 2020-05-11: 20 mL

## 2020-05-11 MED ORDER — INSULIN ASPART 100 UNIT/ML ~~LOC~~ SOLN
0.0000 [IU] | SUBCUTANEOUS | Status: DC
  Administered 2020-05-11: 4 [IU] via SUBCUTANEOUS
  Administered 2020-05-11: 21:00:00 7 [IU] via SUBCUTANEOUS
  Administered 2020-05-12 (×2): 4 [IU] via SUBCUTANEOUS
  Administered 2020-05-12: 12:00:00 7 [IU] via SUBCUTANEOUS
  Administered 2020-05-12: 21:00:00 3 [IU] via SUBCUTANEOUS
  Administered 2020-05-12 – 2020-05-13 (×3): 4 [IU] via SUBCUTANEOUS
  Administered 2020-05-13: 7 [IU] via SUBCUTANEOUS
  Administered 2020-05-13: 4 [IU] via SUBCUTANEOUS

## 2020-05-11 MED ORDER — HEPARIN SODIUM (PORCINE) 5000 UNIT/ML IJ SOLN
5000.0000 [IU] | INTRAMUSCULAR | Status: AC
  Administered 2020-05-11: 5000 [IU] via SUBCUTANEOUS
  Filled 2020-05-11: qty 1

## 2020-05-11 MED ORDER — LACTATED RINGERS IR SOLN
Status: DC | PRN
  Administered 2020-05-11: 1

## 2020-05-11 MED ORDER — OXYCODONE HCL 5 MG/5ML PO SOLN
5.0000 mg | Freq: Four times a day (QID) | ORAL | Status: DC | PRN
  Administered 2020-05-12 – 2020-05-13 (×3): 5 mg via ORAL
  Filled 2020-05-11 (×5): qty 5

## 2020-05-11 MED ORDER — LABETALOL HCL 5 MG/ML IV SOLN
10.0000 mg | INTRAVENOUS | Status: DC | PRN
  Administered 2020-05-11: 10 mg via INTRAVENOUS

## 2020-05-11 MED ORDER — ROCURONIUM BROMIDE 10 MG/ML (PF) SYRINGE
PREFILLED_SYRINGE | INTRAVENOUS | Status: DC | PRN
  Administered 2020-05-11: 20 mg via INTRAVENOUS
  Administered 2020-05-11: 100 mg via INTRAVENOUS

## 2020-05-11 MED ORDER — EPHEDRINE SULFATE-NACL 50-0.9 MG/10ML-% IV SOSY
PREFILLED_SYRINGE | INTRAVENOUS | Status: DC | PRN
  Administered 2020-05-11 (×2): 5 mg via INTRAVENOUS

## 2020-05-11 MED ORDER — ACETAMINOPHEN 500 MG PO TABS
1000.0000 mg | ORAL_TABLET | ORAL | Status: AC
  Administered 2020-05-11: 1000 mg via ORAL
  Filled 2020-05-11: qty 2

## 2020-05-11 MED ORDER — EPHEDRINE 5 MG/ML INJ
INTRAVENOUS | Status: AC
  Filled 2020-05-11: qty 10

## 2020-05-11 MED ORDER — PANTOPRAZOLE SODIUM 40 MG IV SOLR
40.0000 mg | Freq: Every day | INTRAVENOUS | Status: DC
  Administered 2020-05-11 – 2020-05-12 (×2): 40 mg via INTRAVENOUS
  Filled 2020-05-11: qty 40

## 2020-05-11 MED ORDER — ONDANSETRON HCL 4 MG/2ML IJ SOLN
INTRAMUSCULAR | Status: DC | PRN
  Administered 2020-05-11: 4 mg via INTRAVENOUS

## 2020-05-11 MED ORDER — ALBUMIN HUMAN 5 % IV SOLN: INTRAVENOUS | Status: DC | PRN

## 2020-05-11 MED ORDER — OXYCODONE HCL 5 MG/5ML PO SOLN: 5.0000 mg | Freq: Once | ORAL | Status: DC | PRN

## 2020-05-11 MED ORDER — APREPITANT 40 MG PO CAPS
40.0000 mg | ORAL_CAPSULE | ORAL | Status: AC
  Administered 2020-05-11: 40 mg via ORAL
  Filled 2020-05-11: qty 1

## 2020-05-11 MED ORDER — MIDAZOLAM HCL 5 MG/5ML IJ SOLN
INTRAMUSCULAR | Status: DC | PRN
  Administered 2020-05-11: 2 mg via INTRAVENOUS

## 2020-05-11 MED ORDER — FENTANYL CITRATE (PF) 250 MCG/5ML IJ SOLN
INTRAMUSCULAR | Status: DC | PRN
Start: 2020-05-11 — End: 2020-05-11
  Administered 2020-05-11 (×2): 50 ug via INTRAVENOUS
  Administered 2020-05-11: 100 ug via INTRAVENOUS

## 2020-05-11 MED ORDER — OXYCODONE HCL 5 MG PO TABS: 5.0000 mg | ORAL_TABLET | Freq: Once | ORAL | Status: DC | PRN

## 2020-05-11 MED ORDER — SUGAMMADEX SODIUM 200 MG/2ML IV SOLN
INTRAVENOUS | Status: DC | PRN
  Administered 2020-05-11: 450 mg via INTRAVENOUS

## 2020-05-11 MED ORDER — INSULIN ASPART 100 UNIT/ML ~~LOC~~ SOLN
10.0000 [IU] | Freq: Once | SUBCUTANEOUS | Status: AC
  Administered 2020-05-11: 10 [IU] via SUBCUTANEOUS
  Filled 2020-05-11: qty 1

## 2020-05-11 MED ORDER — SODIUM CHLORIDE (PF) 0.9 % IJ SOLN
INTRAMUSCULAR | Status: AC
  Filled 2020-05-11: qty 10

## 2020-05-11 MED ORDER — LABETALOL HCL 5 MG/ML IV SOLN
INTRAVENOUS | Status: AC
  Filled 2020-05-11: qty 4

## 2020-05-11 MED ORDER — ATROPINE SULFATE 1 MG/10ML IJ SOSY
PREFILLED_SYRINGE | INTRAMUSCULAR | Status: AC
  Filled 2020-05-11: qty 10

## 2020-05-11 MED ORDER — ONDANSETRON HCL 4 MG/2ML IJ SOLN
4.0000 mg | INTRAMUSCULAR | Status: DC | PRN
  Administered 2020-05-11 – 2020-05-13 (×6): 4 mg via INTRAVENOUS
  Filled 2020-05-11 (×6): qty 2

## 2020-05-11 MED ORDER — AMISULPRIDE (ANTIEMETIC) 5 MG/2ML IV SOLN
10.0000 mg | Freq: Once | INTRAVENOUS | Status: AC | PRN
  Administered 2020-05-11: 10 mg via INTRAVENOUS

## 2020-05-11 MED ORDER — MEPERIDINE HCL 50 MG/ML IJ SOLN: 6.2500 mg | INTRAMUSCULAR | Status: DC | PRN

## 2020-05-11 MED ORDER — LACTATED RINGERS IV SOLN: INTRAVENOUS | Status: DC

## 2020-05-11 MED ORDER — ORAL CARE MOUTH RINSE: 15.0000 mL | Freq: Once | OROMUCOSAL | Status: AC

## 2020-05-11 MED ORDER — HYDROMORPHONE HCL 1 MG/ML IJ SOLN
INTRAMUSCULAR | Status: AC
  Administered 2020-05-11: 0.5 mg via INTRAVENOUS
  Filled 2020-05-11: qty 1

## 2020-05-11 MED ORDER — SODIUM CHLORIDE (PF) 0.9 % IJ SOLN
INTRAMUSCULAR | Status: DC | PRN
  Administered 2020-05-11: 10 mL

## 2020-05-11 MED ORDER — MIDAZOLAM HCL 2 MG/2ML IJ SOLN
INTRAMUSCULAR | Status: AC
  Filled 2020-05-11: qty 2

## 2020-05-11 SURGICAL SUPPLY — 65 items
APPLICATOR COTTON TIP 6 STRL (MISCELLANEOUS) IMPLANT
APPLICATOR COTTON TIP 6IN STRL (MISCELLANEOUS)
APPLIER CLIP 5 13 M/L LIGAMAX5 (MISCELLANEOUS)
APPLIER CLIP ROT 10 11.4 M/L (STAPLE)
APPLIER CLIP ROT 13.4 12 LRG (CLIP)
BLADE SURG 15 STRL LF DISP TIS (BLADE) ×1 IMPLANT
BLADE SURG 15 STRL SS (BLADE) ×2
CABLE HIGH FREQUENCY MONO STRZ (ELECTRODE) ×3 IMPLANT
CLIP APPLIE 5 13 M/L LIGAMAX5 (MISCELLANEOUS) IMPLANT
CLIP APPLIE ROT 10 11.4 M/L (STAPLE) IMPLANT
CLIP APPLIE ROT 13.4 12 LRG (CLIP) IMPLANT
COVER WAND RF STERILE (DRAPES) IMPLANT
DECANTER SPIKE VIAL GLASS SM (MISCELLANEOUS) ×3 IMPLANT
DERMABOND ADVANCED (GAUZE/BANDAGES/DRESSINGS) ×2
DERMABOND ADVANCED .7 DNX12 (GAUZE/BANDAGES/DRESSINGS) ×1 IMPLANT
DEVICE SUT QUICK LOAD TK 5 (STAPLE) IMPLANT
DEVICE SUT TI-KNOT TK 5X26 (MISCELLANEOUS) IMPLANT
DEVICE SUTURE ENDOST 10MM (ENDOMECHANICALS) IMPLANT
DEVICE TI KNOT TK5 (MISCELLANEOUS)
DISSECTOR BLUNT TIP ENDO 5MM (MISCELLANEOUS) IMPLANT
ELECT L-HOOK LAP 45CM DISP (ELECTROSURGICAL) ×3
ELECT REM PT RETURN 15FT ADLT (MISCELLANEOUS) ×3 IMPLANT
ELECTRODE L-HOOK LAP 45CM DISP (ELECTROSURGICAL) ×1 IMPLANT
GAUZE SPONGE 4X4 12PLY STRL (GAUZE/BANDAGES/DRESSINGS) IMPLANT
GLOVE BIOGEL M 8.0 STRL (GLOVE) ×3 IMPLANT
GOWN STRL REUS W/TWL XL LVL3 (GOWN DISPOSABLE) ×12 IMPLANT
GRASPER SUT TROCAR 14GX15 (MISCELLANEOUS) ×3 IMPLANT
HANDLE STAPLE EGIA 4 XL (STAPLE) ×3 IMPLANT
HOVERMATT SINGLE USE (MISCELLANEOUS) ×3 IMPLANT
KIT BASIN OR (CUSTOM PROCEDURE TRAY) ×3 IMPLANT
KIT TURNOVER KIT A (KITS) IMPLANT
MARKER SKIN DUAL TIP RULER LAB (MISCELLANEOUS) ×3 IMPLANT
NEEDLE SPNL 22GX3.5 QUINCKE BK (NEEDLE) ×3 IMPLANT
PACK CARDIOVASCULAR III (CUSTOM PROCEDURE TRAY) ×3 IMPLANT
QUICK LOAD TK 5 (STAPLE)
RELOAD TRI 45 ART MED THCK BLK (STAPLE) ×6 IMPLANT
RELOAD TRI 45 ART MED THCK PUR (STAPLE) IMPLANT
RELOAD TRI 60 ART MED THCK BLK (STAPLE) ×9 IMPLANT
RELOAD TRI 60 ART MED THCK PUR (STAPLE) IMPLANT
SCISSORS LAP 5X45 EPIX DISP (ENDOMECHANICALS) IMPLANT
SET IRRIG TUBING LAPAROSCOPIC (IRRIGATION / IRRIGATOR) ×3 IMPLANT
SET TUBE SMOKE EVAC HIGH FLOW (TUBING) ×3 IMPLANT
SHEARS HARMONIC ACE PLUS 45CM (MISCELLANEOUS) ×3 IMPLANT
SLEEVE ADV FIXATION 5X100MM (TROCAR) ×6 IMPLANT
SLEEVE GASTRECTOMY 36FR VISIGI (MISCELLANEOUS) ×3 IMPLANT
SOL ANTI FOG 6CC (MISCELLANEOUS) ×1 IMPLANT
SOLUTION ANTI FOG 6CC (MISCELLANEOUS) ×2
SPONGE LAP 18X18 RF (DISPOSABLE) ×3 IMPLANT
STAPLER VISISTAT 35W (STAPLE) IMPLANT
SUT MNCRL AB 4-0 PS2 18 (SUTURE) ×6 IMPLANT
SUT SURGIDAC NAB ES-9 0 48 120 (SUTURE) IMPLANT
SUT VICRYL 0 TIES 12 18 (SUTURE) ×3 IMPLANT
SYR 10ML ECCENTRIC (SYRINGE) ×3 IMPLANT
SYR 20ML LL LF (SYRINGE) ×3 IMPLANT
SYR 50ML LL SCALE MARK (SYRINGE) ×3 IMPLANT
TOWEL OR 17X26 10 PK STRL BLUE (TOWEL DISPOSABLE) ×3 IMPLANT
TOWEL OR NON WOVEN STRL DISP B (DISPOSABLE) ×3 IMPLANT
TRAY FOLEY MTR SLVR 16FR STAT (SET/KITS/TRAYS/PACK) IMPLANT
TROCAR ADV FIXATION 5X100MM (TROCAR) ×3 IMPLANT
TROCAR BLADELESS 15MM (ENDOMECHANICALS) ×3 IMPLANT
TROCAR BLADELESS OPT 5 100 (ENDOMECHANICALS) ×3 IMPLANT
TUBE CALIBRATION LAPBAND (TUBING) IMPLANT
TUBING CONNECTING 10 (TUBING) ×4 IMPLANT
TUBING CONNECTING 10' (TUBING) ×2
TUBING ENDO SMARTCAP (MISCELLANEOUS) ×3 IMPLANT

## 2020-05-11 NOTE — Transfer of Care (Signed)
Immediate Anesthesia Transfer of Care Note  Patient: [name] [name]  Procedure(s) Performed: LAPAROSCOPIC SLEEVE GASTRECTOMY (N/A ) UPPER GASTROINTESTINAL ENDOSCOPY (N/A ) REMOVAL OF ADJUSTABLE BAND PORT AND COMPONENTS (N/A )  Patient Location: PACU  Anesthesia Type:General  Level of Consciousness: awake, alert  and oriented  Airway & Oxygen Therapy: Patient Spontanous Breathing and Patient connected to face mask oxygen  Post-op Assessment: Report given to RN  Post vital signs: Reviewed and stable  Last Vitals:  Vitals Value Taken Time  BP 182/75 05/11/20 1356  Temp    Pulse 70 05/11/20 1400  Resp 17 05/11/20 1400  SpO2 100 % 05/11/20 1400  Vitals shown include unvalidated device data.  Last Pain:  Vitals:   05/11/20 0935  TempSrc: Oral         Complications: No complications documented.

## 2020-05-11 NOTE — Anesthesia Preprocedure Evaluation (Signed)
Anesthesia Evaluation  Patient identified by MRN, date of birth, ID band Patient awake    Reviewed: Allergy & Precautions, NPO status , Patient's Chart, lab work & pertinent test results  Airway Mallampati: II  TM Distance: >3 FB Neck ROM: Full    Dental no notable dental hx.    Pulmonary neg pulmonary ROS,    Pulmonary exam normal breath sounds clear to auscultation       Cardiovascular hypertension, Pt. on medications negative cardio ROS Normal cardiovascular exam Rhythm:Regular Rate:Normal     Neuro/Psych negative neurological ROS  negative psych ROS   GI/Hepatic negative GI ROS, Neg liver ROS,   Endo/Other  diabetes, Type obesity  Renal/GU negative Renal ROS  negative genitourinary   Musculoskeletal negative musculoskeletal ROS (+)   Abdominal (+) + obese,   Peds negative pediatric ROS (+)  Hematology negative hematology ROS (+)   Anesthesia Other Findings   Reproductive/Obstetrics negative OB ROS                             Anesthesia Physical Anesthesia Plan  ASA: III  Anesthesia Plan: General   Post-op Pain Management:    Induction: Intravenous  PONV Risk Score and Plan: 3 and Ondansetron, Dexamethasone, Midazolam and Treatment may vary due to age or medical condition  Airway Management Planned: Oral ETT  Additional Equipment:   Intra-op Plan:   Post-operative Plan: Extubation in OR  Informed Consent: I have reviewed the patients History and Physical, chart, labs and discussed the procedure including the risks, benefits and alternatives for the proposed anesthesia with the patient or authorized representative who has indicated his/her understanding and acceptance.     Dental advisory given  Plan Discussed with: CRNA  Anesthesia Plan Comments:         Anesthesia Quick Evaluation

## 2020-05-11 NOTE — Interval H&P Note (Signed)
History and Physical Interval Note:  05/11/2020 10:19 AM  [name] [name]  has presented today for surgery, with the diagnosis of morbid obesity.  The various methods of treatment have been discussed with the patient and family. After consideration of risks, benefits and other options for treatment, the patient has consented to  Procedure(s): LAPAROSCOPIC SLEEVE GASTRECTOMY (N/A) UPPER GASTROINTESTINAL ENDOSCOPY (N/A) REMOVAL OF ADJUSTABLE BAND PORT AND COMPONENTS (N/A) as a surgical intervention.  The patient's history has been reviewed, patient examined, no change in status, stable for surgery.  I have reviewed the patient's chart and labs.  Questions were answered to the patient's satisfaction.     Valarie Merino

## 2020-05-11 NOTE — Progress Notes (Signed)
Discussed post op day goals with patient including ambulation, IS, diet progression, pain, and nausea control.  BSTOP education provided including BSTOP information guide, "Guide for Pain Management after your Bariatric Procedure". Placed Diabetes Coordinator consult to discuss recommendations for DM medications at discharge.

## 2020-05-11 NOTE — Op Note (Signed)
11 May 2020  Surgeon: Wenda Low, MD, FACS  Asst:  Ovidio Kin, MD, FACS  Anes:  General endotracheal  Procedure: Laparoscopic removal of lapband (2010) and  sleeve gastrectomy and upper endoscopy  Diagnosis: Morbid obesity  Complications: None noted  EBL:   minimal cc  Description of Procedure:  The patient was take to OR 2 and given general anesthesia.  The abdomen was prepped with Chloroprep and draped sterilely.  A timeout was performed.  Access to the abdomen was achieved with a 5 mm Optiview through the left upper quadrant.  Following insufflation, the state of the abdomen was found to be with a few adhesions to the umbilicus and the site of the port.  The ViSiGi 36Fr tube was inserted to deflate the stomach and was pulled back into the esophagus.  Standard trocar placement.  The plication of the lapband was taken down with the electrocautery and unbuckled and removed.  The cicatrix was taken down with the scissors.  The plication was taken down entirely; no band erosion noted.   The pylorus was identified and we measured 5 cm back and marked the antrum.  At that point we began dissection to take down the greater curvature of the stomach using the Harmonic scalpel.  This dissection was taken all the way up to the left crus.  Posterior attachments of the stomach were also taken down.    The ViSiGi tube was then passed into the antrum and suction applied so that it was snug along the lessor curvature.  The "crow's foot" or incisura was identified.  The sleeve gastrectomy was begun using the [employer] stapler beginning with a 4.5 black with the remaining 6 cm black loads with TRS and then a 4.5 cm black with TRS.  When the sleeve was complete the tube was taken off suction and insufflated briefly.  The tube was withdrawn.  Upper endoscopy was then performed by Dr. Ezzard Standing.     The specimen was extracted through the 15 trocar site.  The port was removed.   Local was provided  by infiltrating with 30 cc of Exparel and closed 4-0 Monocryl and Dermabond.    Matt B. Daphine Deutscher, MD, [facility], [place] [phone]

## 2020-05-11 NOTE — Progress Notes (Signed)
PHARMACY CONSULT FOR:  Risk Assessment for Post-Discharge VTE Following Bariatric Surgery  Post-Discharge VTE Risk Assessment: This patient's probability of 30-day post-discharge VTE is increased due to the factors marked:   Female    Age >/=60 years   X BMI >/=50 kg/m2    CHF    Dyspnea at Rest    Paraplegia  X  Non-gastric-band surgery    Operation Time >/=3 hr    Return to OR     Length of Stay >/= 3 d   Hx of VTE   Hypercoagulable condition   Significant venous stasis   Predicted probability of 30-day post-discharge VTE: 0.27%  Mild  Recommendation for Discharge: No pharmacologic prophylaxis post-discharge     [name] [name] is a 54 y.o. female who underwent  laparoscopic sleeve gastrectomy on 05/11/20.   CASE START 1148  CASE END 1350   No Known Allergies  Patient Measurements: Height: 5\' 6"  (167.6 cm) Weight: (!) 149.8 kg (330 lb 3.2 oz) IBW/kg (Calculated) : 59.3 Body mass index is 53.3 kg/m.  No results for input(s): WBC, HGB, HCT, PLT, APTT, CREATININE, LABCREA, CREATININE, CREAT24HRUR, MG, PHOS, ALBUMIN, PROT, ALBUMIN, AST, ALT, ALKPHOS, BILITOT, BILIDIR, IBILI in the last 72 hours. Estimated Creatinine Clearance: 121.2 mL/min (by C-G formula based on SCr of 0.69 mg/dL).    Past Medical History:  Diagnosis Date  . Crohn's colitis ([place])   . Diverticulosis OCT 2011    PAN-COLONIC  . DM (diabetes mellitus) ([place])   . Dyspnea    On exertion  . HTN (hypertension)   . Hyperlipemia   . 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 Obesity, Class III, BMI 40-49.9 (morbid obesity) ([place]) AUG 2010 305 LBS   MAY 2012 267  . Vitamin D deficiency      Medications Prior to Admission  Medication Sig Dispense Refill Last Dose  . AMLODIPINE BENZOATE PO Take 5 mg by mouth daily.   05/10/2020 at Unknown time  . atorvastatin (LIPITOR) 10 MG tablet Take 10 mg by mouth daily.   05/10/2020 at Unknown time  . Blood Glucose Monitoring Suppl  (ONETOUCH VERIO) w/Device KIT 1 each by Does not apply route as needed. 1 kit 0 05/11/2020 at Unknown time  . Continuous Blood Gluc Sensor (FREESTYLE LIBRE 14 DAY SENSOR) MISC USE AS DIRECTED EVERY 14 DAYS 2 each 2 05/11/2020 at Unknown time  . diclofenac (VOLTAREN) 50 MG EC tablet Take 50 mg by mouth 2 (two) times daily.   1 05/10/2020 at Unknown time  . DULoxetine (CYMBALTA) 30 MG capsule Take 30 mg by mouth daily.   05/11/2020 at 0700  . gabapentin (NEURONTIN) 300 MG capsule Take 600 mg by mouth at bedtime.   Past Week at Unknown time  . glipiZIDE (GLUCOTROL XL) 5 MG 24 hr tablet TAKE 1 TABLET(5 MG) BY MOUTH DAILY WITH BREAKFAST (Patient taking differently: Take 5 mg by mouth daily with breakfast. ) 30 tablet 5 05/10/2020 at Unknown time  . glucose blood (ONETOUCH VERIO) test strip USE AS INSTRUCTED TO TEST BLOOD SUGAR FOUR TIMES DAILY 200 strip 1 05/11/2020 at Unknown time  . insulin degludec (TRESIBA FLEXTOUCH) 200 UNIT/ML FlexTouch Pen Inject 64 Units into the skin at bedtime. 4 pen 2 Past Week at Unknown time  . insulin lispro (HUMALOG) 100 UNIT/ML KwikPen INJECT 18-24 UNITS INTO THE SKIN THREE TIMES DAILY BEFORE A MEAL (Patient taking differently: Inject 18-24 Units into the skin 3 (three)  times daily before meals. ) 63 mL 1 05/11/2020 at 0700  . Insulin Pen Needle (B-D ULTRAFINE III SHORT PEN) 31G X 8 MM MISC 1 each by Does not apply route as directed. 1000 each 1 05/11/2020 at Unknown time  . Lancets Thin MISC 1 each by Does not apply route 4 (four) times daily. Use to test blood glucose 4 times daily 200 each 1 05/11/2020 at Unknown time  . lisinopril (PRINIVIL,ZESTRIL) 40 MG tablet Take 40 mg by mouth daily.   05/10/2020 at Unknown time  . metoprolol succinate (TOPROL-XL) 25 MG 24 hr tablet Take 25 mg by mouth daily.   05/11/2020 at 0700  . potassium chloride (K-DUR) 10 MEQ tablet Take 10 mEq by mouth daily as needed (Take only whe take Torsemide).    05/10/2020 at Unknown time  . torsemide (DEMADEX)  20 MG tablet Take 20 mg by mouth daily as needed (Excess fluid).   Past Week at Unknown time  . Vitamin D, Ergocalciferol, (DRISDOL) 1.25 MG (50000 UNIT) CAPS capsule TAKE 1 CAPSULE BY MOUTH EVERY 7 DAYS (Patient taking differently: Take 50,000 Units by mouth every 7 (seven) days. ) 12 capsule 0 Past Week at Unknown time    Gretta Arab PharmD, BCPS Clinical Pharmacist [facility] main pharmacy [phone] 05/11/2020 11:41 AM

## 2020-05-11 NOTE — Anesthesia Postprocedure Evaluation (Signed)
Anesthesia Post Note  Patient: [name] [name]  Procedure(s) Performed: LAPAROSCOPIC SLEEVE GASTRECTOMY (N/A ) UPPER GASTROINTESTINAL ENDOSCOPY (N/A ) REMOVAL OF ADJUSTABLE BAND PORT AND COMPONENTS (N/A )     Patient location during evaluation: PACU Anesthesia Type: General Level of consciousness: awake and alert Pain management: pain level controlled Vital Signs Assessment: post-procedure vital signs reviewed and stable Respiratory status: spontaneous breathing, nonlabored ventilation and respiratory function stable Cardiovascular status: blood pressure returned to baseline and stable Postop Assessment: no apparent nausea or vomiting Anesthetic complications: no   No complications documented.  Last Vitals:  Vitals:   05/11/20 1500 05/11/20 1515  BP: (!) 183/78 (!) 174/70  Pulse: 68 67  Resp: 17 16  Temp:  36.6 C  SpO2: 97% 98%    Last Pain:  Vitals:   05/11/20 1500  TempSrc:   PainSc: Asleep                 Lowella Curb

## 2020-05-12 ENCOUNTER — Ambulatory Visit: Payer: 59 | Admitting: "Endocrinology

## 2020-05-12 ENCOUNTER — Encounter (HOSPITAL_COMMUNITY): Payer: Self-pay | Admitting: Surgery

## 2020-05-12 LAB — CBC WITH DIFFERENTIAL/PLATELET
Abs Immature Granulocytes: 0.05 10*3/uL (ref 0.00–0.07)
Basophils Absolute: 0 10*3/uL (ref 0.0–0.1)
Basophils Relative: 0 %
Eosinophils Absolute: 0 10*3/uL (ref 0.0–0.5)
Eosinophils Relative: 0 %
HCT: 36.7 % (ref 36.0–46.0)
Hemoglobin: 11.4 g/dL — ABNORMAL LOW (ref 12.0–15.0)
Immature Granulocytes: 0 %
Lymphocytes Relative: 14 %
Lymphs Abs: 1.7 10*3/uL (ref 0.7–4.0)
MCH: 27.9 pg (ref 26.0–34.0)
MCHC: 31.1 g/dL (ref 30.0–36.0)
MCV: 90 fL (ref 80.0–100.0)
Monocytes Absolute: 1 10*3/uL (ref 0.1–1.0)
Monocytes Relative: 8 %
Neutro Abs: 9.5 10*3/uL — ABNORMAL HIGH (ref 1.7–7.7)
Neutrophils Relative %: 78 %
Platelets: 213 10*3/uL (ref 150–400)
RBC: 4.08 MIL/uL (ref 3.87–5.11)
RDW: 13.7 % (ref 11.5–15.5)
WBC: 12.3 10*3/uL — ABNORMAL HIGH (ref 4.0–10.5)
nRBC: 0 % (ref 0.0–0.2)

## 2020-05-12 LAB — GLUCOSE, CAPILLARY
Glucose-Capillary: 144 mg/dL — ABNORMAL HIGH (ref 70–99)
Glucose-Capillary: 175 mg/dL — ABNORMAL HIGH (ref 70–99)
Glucose-Capillary: 194 mg/dL — ABNORMAL HIGH (ref 70–99)
Glucose-Capillary: 195 mg/dL — ABNORMAL HIGH (ref 70–99)
Glucose-Capillary: 198 mg/dL — ABNORMAL HIGH (ref 70–99)
Glucose-Capillary: 221 mg/dL — ABNORMAL HIGH (ref 70–99)

## 2020-05-12 LAB — SURGICAL PATHOLOGY

## 2020-05-12 NOTE — Progress Notes (Signed)
Patient nauseated through out day requiring IV antiemetic.  Protein/fluid  intake at minimum.  Ambulated in hallway, encouraged to continue ambulation.  Discussed with Dr Daphine Deutscher.

## 2020-05-12 NOTE — Progress Notes (Signed)
Patient alert and oriented, Post op day 1.  Provided support and encouragement.  Encouraged pulmonary toilet, ambulation and small sips of liquids.  Completed 12 ounces of bari clear liquid started protein this morning.  All questions answered.  Will continue to monitor.

## 2020-05-12 NOTE — Progress Notes (Signed)
Inpatient Diabetes Program Recommendations  AACE/ADA: New Consensus Statement on Inpatient Glycemic Control (2015)  Target Ranges:  Prepandial:   less than 140 mg/dL      Peak postprandial:   less than 180 mg/dL (1-2 hours)      Critically ill patients:  140 - 180 mg/dL   Lab Results  Component Value Date   GLUCAP 195 (H) 05/12/2020   HGBA1C 7.4 (H) 05/07/2020    Review of Glycemic Control Results for UNIQUA, KIHN (MRN [id number]) as of 05/12/2020 11:01  Ref. Range 05/11/2020 20:19 05/12/2020 00:16 05/12/2020 04:15 05/12/2020 07:39  Glucose-Capillary Latest Ref Range: 70 - 99 mg/dL 827 (H)  Novolog 7 units 194 (H)  Novolog 4 units 175 (H)  Novolog 4 units 195 (H)  Novolog 4 units   Diabetes history: DM 2 Outpatient Diabetes medications: Glipizide 5 mg Daily, Tresiba 64 units qhs, Humalog 18-24 units tid before meals Current orders for Inpatient glycemic control:  Novolog 0-20 units Q4 hours  Inpatient Diabetes Program Recommendations:    Pt should have Humalog at home would d/c on a "resistant" Correction scale 4x/day at home.   Check glucose 4x/day (Continue Freestyle Libre sensor at home). If Glucose >200 for 2-3 checks call PCP.  Thanks, Christena Deem RN, MSN, BC-ADM Inpatient Diabetes Coordinator Team Pager [phone] (8a-5p)

## 2020-05-12 NOTE — Progress Notes (Signed)
Nutrition Note  RD consulted for diet education for patient s/p bariatric surgery. Bariatric nurse coordinator providing education.  If nutrition issues arise, please consult RD.   Wahneta Derocher, MS, RD, LDN Inpatient Clinical Dietitian Contact information available via Amion  

## 2020-05-13 LAB — CBC WITH DIFFERENTIAL/PLATELET
Abs Immature Granulocytes: 0.03 10*3/uL (ref 0.00–0.07)
Basophils Absolute: 0.1 10*3/uL (ref 0.0–0.1)
Basophils Relative: 1 %
Eosinophils Absolute: 0.3 10*3/uL (ref 0.0–0.5)
Eosinophils Relative: 3 %
HCT: 36.9 % (ref 36.0–46.0)
Hemoglobin: 11.2 g/dL — ABNORMAL LOW (ref 12.0–15.0)
Immature Granulocytes: 0 %
Lymphocytes Relative: 19 %
Lymphs Abs: 1.9 10*3/uL (ref 0.7–4.0)
MCH: 27.7 pg (ref 26.0–34.0)
MCHC: 30.4 g/dL (ref 30.0–36.0)
MCV: 91.3 fL (ref 80.0–100.0)
Monocytes Absolute: 0.9 10*3/uL (ref 0.1–1.0)
Monocytes Relative: 9 %
Neutro Abs: 6.9 10*3/uL (ref 1.7–7.7)
Neutrophils Relative %: 68 %
Platelets: 181 10*3/uL (ref 150–400)
RBC: 4.04 MIL/uL (ref 3.87–5.11)
RDW: 13.7 % (ref 11.5–15.5)
WBC: 10.1 10*3/uL (ref 4.0–10.5)
nRBC: 0 % (ref 0.0–0.2)

## 2020-05-13 LAB — GLUCOSE, CAPILLARY
Glucose-Capillary: 155 mg/dL — ABNORMAL HIGH (ref 70–99)
Glucose-Capillary: 186 mg/dL — ABNORMAL HIGH (ref 70–99)
Glucose-Capillary: 220 mg/dL — ABNORMAL HIGH (ref 70–99)

## 2020-05-13 MED ORDER — ONDANSETRON 4 MG PO TBDP: 4.0000 mg | ORAL_TABLET | Freq: Four times a day (QID) | ORAL | 0 refills | Status: DC | PRN

## 2020-05-13 MED ORDER — PANTOPRAZOLE SODIUM 40 MG PO TBEC: 40.0000 mg | DELAYED_RELEASE_TABLET | Freq: Every day | ORAL | 0 refills | Status: AC

## 2020-05-13 MED ORDER — OXYCODONE HCL 5 MG PO TABS: 5.0000 mg | ORAL_TABLET | Freq: Four times a day (QID) | ORAL | 0 refills | Status: DC | PRN

## 2020-05-13 NOTE — Progress Notes (Signed)
Patient ID: [name] [name], female   DOB: Mar 19, 1966, 54 y.o.   MRN: [id number] [facility] Progress Note:   2 Days Post-Op  Subjective: Mental status is clear.  Complaints minimal except PO intake. Objective: Vital signs in last 24 hours: Temp:  [98 F (36.7 C)-98.8 F (37.1 C)] 98.3 F (36.8 C) (08/25 0507) Pulse Rate:  [65-102] 88 (08/25 0507) Resp:  [15-18] 15 (08/25 0507) BP: (157-186)/(65-81) 157/81 (08/25 0507) SpO2:  [93 %-99 %] 93 % (08/25 0507)  Intake/Output from previous day: 08/24 0701 - 08/25 0700 In: 720 [P.O.:720] Out: 1750 [Urine:1750] Intake/Output this shift: No intake/output data recorded.  Physical Exam: Work of breathing is normal.  Pain under control  Lab Results:  Results for orders placed or performed during the hospital encounter of 05/11/20 (from the past 48 hour(s))  Surgical pathology     Status: None   Collection Time: 05/11/20  1:34 PM  Result Value Ref Range   SURGICAL PATHOLOGY      SURGICAL PATHOLOGY CASE: [id number] PATIENT: [name] [name] Surgical Pathology Report     Clinical History: Morbid obesity [rd]     FINAL MICROSCOPIC DIAGNOSIS:  A. STOMACH, GREATER CURVATURE, GASTRIC SLEEVE RESECTION, GROSS DIAGNOSIS ONLY: Portion of stomach, clinically greater curvature, with no gross pathologic findings.   GROSS DESCRIPTION:  Received fresh is a 20.2 x 4 x 2.7 cm portion of stomach, clinically greater curvature, which has a pink to hyperemic smooth serosa, and a staple line along the entire length of the specimen on one aspect. There is minimal attached soft fatty tissue.  On opening, the stomach contains a small amount of dark red soft mucoid material.  The mucosa is tan-pink to hyperemic, smooth, soft with normal gastric folds.  No discrete lesions or masses are identified.  No sections submitted. Gross diagnosis only.  SW 05/11/2020   Final Diagnosis performed by Holley Bouche, MD.    Electronically signed 05/12/2020 Gasper Lloyd l and / or Professional components performed at [facility], [address] [place], [place] [address].  Immunohistochemistry Technical component (if applicable) was performed at [facility]. [address], [place], [place] [address].   IMMUNOHISTOCHEMISTRY DISCLAIMER (if applicable): Some of these immunohistochemical stains may have been developed and the performance characteristics determine by [facility]. Some may not have been cleared or approved by the U.S. Food and Drug Administration. The FDA has determined that such clearance or approval is not necessary. This test is used for clinical purposes. It should not be regarded as investigational or for research. This laboratory is certified under the Clinical Laboratory Improvement Amendments of 1988 (CLIA-88) as qualified to perform high complexity clinical laboratory testing.  The controls stained appropriately.   Glucose, capillary     Status: Abnormal   Collection Time: 05/11/20  2:46 PM  Result Value Ref Range   Glucose-Capillary 164 (H) 70 - 99 mg/dL    Comment: Glucose reference range applies only to samples taken after fasting for at least 8 hours.  Glucose, capillary     Status: Abnormal   Collection Time: 05/11/20  3:42 PM  Result Value Ref Range   Glucose-Capillary 177 (H) 70 - 99 mg/dL    Comment: Glucose reference range applies only to samples taken after fasting for at least 8 hours.  CBC     Status: Abnormal   Collection Time: 05/11/20  6:12 PM  Result Value Ref Range   WBC 15.7 (H) 4.0 - 10.5 K/uL  RBC 4.25 3.87 - 5.11 MIL/uL   Hemoglobin 12.0 12.0 - 15.0 g/dL   HCT 73.5 36 - 46 %   MCV 89.4 80.0 - 100.0 fL   MCH 28.2 26.0 - 34.0 pg   MCHC 31.6 30.0 - 36.0 g/dL   RDW 32.9 92.4 - 26.8 %   Platelets 253 150 - 400 K/uL   nRBC 0.0 0.0 - 0.2 %    Comment: Performed at [facility], [address], [place], [place] [address]  Creatinine, serum     Status: None   Collection Time: 05/11/20  6:12 PM  Result Value Ref Range   Creatinine, Ser 0.74 0.44 - 1.00 mg/dL   GFR calc non Af Amer >60 >60 mL/min   GFR calc Af Amer >60 >60 mL/min    Comment: Performed at [facility], [address] [place], [place] [address]  Glucose, capillary     Status: Abnormal   Collection Time: 05/11/20  8:19 PM  Result Value Ref Range   Glucose-Capillary 213 (H) 70 - 99 mg/dL    Comment: Glucose reference range applies only to samples taken after fasting for at least 8 hours.  Glucose, capillary     Status: Abnormal   Collection Time: 05/12/20 12:16 AM  Result Value Ref Range   Glucose-Capillary 194 (H) 70 - 99 mg/dL    Comment: Glucose reference range applies only to samples taken after fasting for at least 8 hours.  Glucose, capillary     Status: Abnormal   Collection Time: 05/12/20  4:15 AM  Result Value Ref Range   Glucose-Capillary 175 (H) 70 - 99 mg/dL    Comment: Glucose reference range applies only to samples taken after fasting for at least 8 hours.  CBC WITH DIFFERENTIAL     Status: Abnormal   Collection Time: 05/12/20  5:41 AM  Result Value Ref Range   WBC 12.3 (H) 4.0 - 10.5 K/uL   RBC 4.08 3.87 - 5.11 MIL/uL   Hemoglobin 11.4 (L) 12.0 - 15.0 g/dL   HCT 98.9 36 - 46 %   MCV 90.0 80.0 - 100.0 fL   MCH 27.9 26.0 - 34.0 pg   MCHC 31.1 30.0 - 36.0 g/dL   RDW 21.1 94.1 - 74.0 %   Platelets 213 150 - 400 K/uL   nRBC 0.0 0.0 - 0.2 %   Neutrophils Relative % 78 %   Neutro Abs 9.5 (H) 1.7 - 7.7 K/uL   Lymphocytes Relative 14 %   Lymphs Abs 1.7 0.7 - 4.0 K/uL   Monocytes Relative 8 %   Monocytes Absolute 1.0 0 - 1 K/uL   Eosinophils Relative 0 %   Eosinophils Absolute 0.0 0 - 0 K/uL   Basophils Relative 0 %   Basophils Absolute 0.0 0 - 0 K/uL   Immature Granulocytes 0 %   Abs Immature Granulocytes 0.05 0.00 - 0.07 K/uL    Comment: Performed at [facility], [address] [place], [place] [address]  Glucose, capillary     Status: Abnormal   Collection Time: 05/12/20  7:39 AM  Result Value Ref Range   Glucose-Capillary 195 (H) 70 - 99 mg/dL    Comment: Glucose reference range applies only to samples taken after fasting for at least 8 hours.  Glucose, capillary     Status: Abnormal   Collection Time: 05/12/20 11:47 AM  Result Value Ref Range   Glucose-Capillary 221 (H) 70 - 99 mg/dL  Comment: Glucose reference range applies only to samples taken after fasting for at least 8 hours.  Glucose, capillary     Status: Abnormal   Collection Time: 05/12/20  4:11 PM  Result Value Ref Range   Glucose-Capillary 198 (H) 70 - 99 mg/dL    Comment: Glucose reference range applies only to samples taken after fasting for at least 8 hours.  Glucose, capillary     Status: Abnormal   Collection Time: 05/12/20  8:12 PM  Result Value Ref Range   Glucose-Capillary 144 (H) 70 - 99 mg/dL    Comment: Glucose reference range applies only to samples taken after fasting for at least 8 hours.  Glucose, capillary     Status: Abnormal   Collection Time: 05/13/20 12:06 AM  Result Value Ref Range   Glucose-Capillary 186 (H) 70 - 99 mg/dL    Comment: Glucose reference range applies only to samples taken after fasting for at least 8 hours.  CBC with Differential     Status: Abnormal   Collection Time: 05/13/20  4:53 AM  Result Value Ref Range   WBC 10.1 4.0 - 10.5 K/uL   RBC 4.04 3.87 - 5.11 MIL/uL   Hemoglobin 11.2 (L) 12.0 - 15.0 g/dL   HCT 40.936.9 36 - 46 %   MCV 91.3 80.0 - 100.0 fL   MCH 27.7 26.0 - 34.0 pg   MCHC 30.4 30.0 - 36.0 g/dL   RDW 81.113.7 91.411.5 - 78.215.5 %   Platelets 181 150 - 400 K/uL   nRBC 0.0 0.0 - 0.2 %   Neutrophils Relative % 68 %   Neutro Abs 6.9 1.7 - 7.7 K/uL   Lymphocytes Relative 19 %   Lymphs Abs 1.9 0.7 - 4.0 K/uL   Monocytes Relative 9 %   Monocytes Absolute 0.9 0 - 1 K/uL   Eosinophils Relative 3 %   Eosinophils  Absolute 0.3 0 - 0 K/uL   Basophils Relative 1 %   Basophils Absolute 0.1 0 - 0 K/uL   Immature Granulocytes 0 %   Abs Immature Granulocytes 0.03 0.00 - 0.07 K/uL    Comment: Performed at [facility], [address] [place], [place] [id number]  Glucose, capillary     Status: Abnormal   Collection Time: 05/13/20  5:04 AM  Result Value Ref Range   Glucose-Capillary 155 (H) 70 - 99 mg/dL    Comment: Glucose reference range applies only to samples taken after fasting for at least 8 hours.  Glucose, capillary     Status: Abnormal   Collection Time: 05/13/20  7:30 AM  Result Value Ref Range   Glucose-Capillary 220 (H) 70 - 99 mg/dL    Comment: Glucose reference range applies only to samples taken after fasting for at least 8 hours.    Radiology/Results: No results found.  Anti-infectives: Anti-infectives (From admission, onward)   Start     Dose/Rate Route Frequency Ordered Stop   05/11/20 0930  cefoTEtan (CEFOTAN) 2 g in sodium chloride 0.9 % 100 mL IVPB        2 g 200 mL/hr over 30 Minutes Intravenous On call to O.R. 05/11/20 0920 05/11/20 1115      Assessment/Plan: Problem List: Patient Active Problem List   Diagnosis Date Noted  . S/P laparoscopic sleeve gastrectomy 05/11/2020  . Vitamin D deficiency 09/14/2017  . Uncontrolled type 2 diabetes mellitus with hyperglycemia (HCC) 06/20/2017  . Essential hypertension, benign 06/20/2017  . Hypokalemia 07/18/2013  . Nausea vomiting and  diarrhea 07/17/2013  . Abdominal pain 07/17/2013  . Enteritis 07/17/2013  . Hyponatremia 07/17/2013  . Leukocytosis 07/17/2013  . Hyperglycemia 07/17/2013  . Tachycardia 07/17/2013  . DM (diabetes mellitus) (HCC)   . HTN (hypertension)   . Hyperlipemia   . Morbid obesity (HCC)   . Ileitis, 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    Not taking enough PO to advance and discharge.   2 Days Post-Op    LOS: 2 days   Matt B. Daphine Deutscher, MD, [facility] [phone] to reach the surgeon on call.    05/13/2020 10:01 AM

## 2020-05-13 NOTE — Progress Notes (Signed)
Patient alert and oriented, pain is controlled. Patient is tolerating fluids, advanced to protein shake today, patient is tolerating well.  Reviewed Gastric sleeve discharge instructions with patient and patient is able to articulate understanding.  Provided information on BELT program, Support Group and [facility] outpatient pharmacy. All questions answered, will continue to monitor.  Total fluid intake 750 Per dehydration protocol call back one week postop 

## 2020-05-13 NOTE — Progress Notes (Signed)
Discharge instructions given by [facility].  All questions were answered.  Patient taken to main exit in wheelchair.

## 2020-05-13 NOTE — Discharge Summary (Signed)
Physician Discharge Summary  Patient ID: [name] [name] MRN: [id number] DOB/AGE: 01-03-1966 54 y.o.  PCP: Caryl Bis, MD  Admit date: 05/11/2020 Discharge date: 05/13/2020  Admission Diagnoses:  Dysfunctional lapband  Discharge Diagnoses:  same  Active Problems:   S/P laparoscopic sleeve gastrectomy   Surgery:  Removal of lapband and conversion to sleeve gastrectomy  Discharged Condition: improved  Hospital Course:   Had surgery on Monday.  PO intake inadequate on Tuesday.  Was drinking shakes OK on Wed and ready for discharge.  Pain controlled.    Consults: diabetes management, pharmacy  Significant Diagnostic Studies: none    Discharge Exam: Blood pressure (!) 157/81, pulse 88, temperature 98.3 F (36.8 C), temperature source Oral, resp. rate 15, height 5' 6"  (1.676 m), weight (!) 149.8 kg, last menstrual period 11/01/2015, SpO2 93 %. Incisions ok  Disposition: Discharge disposition: 01-Home or Self Care       Discharge Instructions    Ambulate hourly while awake   Complete by: As directed    Call MD for:  difficulty breathing, headache or visual disturbances   Complete by: As directed    Call MD for:  persistant dizziness or light-headedness   Complete by: As directed    Call MD for:  persistant nausea and vomiting   Complete by: As directed    Call MD for:  redness, tenderness, or signs of infection (pain, swelling, redness, odor or green/yellow discharge around incision site)   Complete by: As directed    Call MD for:  severe uncontrolled pain   Complete by: As directed    Call MD for:  temperature >101 F   Complete by: As directed    Diet bariatric full liquid   Complete by: As directed    Discharge instructions   Complete by: As directed    Pt should have Humalog at home would d/c on a "resistant" Correction scale 4x/day at home.   Check glucose 4x/day (Continue Freestyle Libre sensor at home). If Glucose >200 for 2-3 checks call PCP.    Incentive spirometry   Complete by: As directed    Perform hourly while awake     Allergies as of 05/13/2020   No Known Allergies     Medication List    STOP taking these medications   diclofenac 50 MG EC tablet Commonly known as: VOLTAREN     TAKE these medications   AMLODIPINE BENZOATE PO Take 5 mg by mouth daily. Notes to patient: Monitor Blood Pressure Daily and keep a log for primary care physician.  You may need to make changes to your medications with rapid weight loss.     atorvastatin 10 MG tablet Commonly known as: LIPITOR Take 10 mg by mouth daily.   B-D ULTRAFINE III SHORT PEN 31G X 8 MM Misc Generic drug: Insulin Pen Needle 1 each by Does not apply route as directed.   DULoxetine 30 MG capsule Commonly known as: CYMBALTA Take 30 mg by mouth daily.   FreeStyle Libre 14 Day Sensor Misc USE AS DIRECTED EVERY 14 DAYS   gabapentin 300 MG capsule Commonly known as: NEURONTIN Take 600 mg by mouth at bedtime.   glipiZIDE 5 MG 24 hr tablet Commonly known as: GLUCOTROL XL TAKE 1 TABLET(5 MG) BY MOUTH DAILY WITH BREAKFAST What changed: See the new instructions.   insulin lispro 100 UNIT/ML KwikPen Commonly known as: HUMALOG INJECT 18-24 UNITS INTO THE SKIN THREE TIMES DAILY BEFORE A MEAL What changed: See the new instructions.  Lancets Thin Misc 1 each by Does not apply route 4 (four) times daily. Use to test blood glucose 4 times daily   lisinopril 40 MG tablet Commonly known as: ZESTRIL Take 40 mg by mouth daily. Notes to patient: Monitor Blood Pressure Daily and keep a log for primary care physician.  You may need to make changes to your medications with rapid weight loss.     metoprolol succinate 25 MG 24 hr tablet Commonly known as: TOPROL-XL Take 25 mg by mouth daily. Notes to patient: Monitor Blood Pressure Daily and keep a log for primary care physician.  You may need to make changes to your medications with rapid weight loss.     ondansetron 4  MG disintegrating tablet Commonly known as: ZOFRAN-ODT Take 1 tablet (4 mg total) by mouth every 6 (six) hours as needed for nausea or vomiting.   OneTouch Verio test strip Generic drug: glucose blood USE AS INSTRUCTED TO TEST BLOOD SUGAR FOUR TIMES DAILY   OneTouch Verio w/Device Kit 1 each by Does not apply route as needed.   oxyCODONE 5 MG immediate release tablet Commonly known as: Oxy IR/ROXICODONE Take 1 tablet (5 mg total) by mouth every 6 (six) hours as needed for severe pain.   pantoprazole 40 MG tablet Commonly known as: PROTONIX Take 1 tablet (40 mg total) by mouth daily.   potassium chloride 10 MEQ tablet Commonly known as: KLOR-CON Take 10 mEq by mouth daily as needed (Take only whe take Torsemide).   torsemide 20 MG tablet Commonly known as: DEMADEX Take 20 mg by mouth daily as needed (Excess fluid). Notes to patient: Monitor Blood Pressure Daily and keep a log for primary care physician.  Monitor for symptoms of dehydration.  You may need to make changes to your medications with rapid weight loss.     Tyler Aas FlexTouch 200 UNIT/ML FlexTouch Pen Generic drug: insulin degludec Inject 64 Units into the skin at bedtime. Notes to patient: Monitor Blood Sugar Frequently and keep a log for primary care physician, you may need to adjust medication dosage with rapid weight loss.     Vitamin D (Ergocalciferol) 1.25 MG (50000 UNIT) Caps capsule Commonly known as: DRISDOL TAKE 1 CAPSULE BY MOUTH EVERY 7 DAYS What changed:   how much to take  how to take this  when to take this  additional instructions       Follow-up Information    Surgery, [place]. Go on 05/27/2020.   Specialty: General Surgery Why: at 130 pm with Dr Johnathan Hausen.  Please arrive 15 minutes prior to appointment.  Thank you Contact information: [place] [address] [phone]        Carlena Hurl, PA-C. Go on 06/30/2020.   Specialty: General  Surgery Why: at 130 pm.  Please arrive 15 minutes prior to appointment.  Thank you Contact information: [place] [address] [phone]               Signed: Pedro Earls 05/13/2020, 10:07 AM

## 2020-05-13 NOTE — Progress Notes (Signed)
Patient alert and oriented, Post op day 2.  Provided support and encouragement.  Encouraged pulmonary toilet, ambulation and small sips of liquids.  Working on protein shake this morning.  All questions answered.  Will continue to monitor.

## 2020-05-13 NOTE — Discharge Instructions (Addendum)
   GASTRIC BYPASS/SLEEVE  Home Care Instructions   These instructions are to help you care for yourself when you go home.  Call: If you have any problems. . Call [phone] and ask for the surgeon on call . If you need immediate help, come to the ER at [facility].  . Tell the ER staff that you are a new post-op gastric bypass or gastric sleeve patient   Signs and symptoms to report: . Severe vomiting or nausea o If you cannot keep down clear liquids for longer than 1 day, call your surgeon  . Abdominal pain that does not get better after taking your pain medication . Fever over 100.4 F with chills . Heart beating over 100 beats a minute . Shortness of breath at rest . Chest pain .  Redness, swelling, drainage, or foul odor at incision (surgical) sites .  If your incisions open or pull apart . Swelling or pain in calf (lower leg) . Diarrhea (Loose bowel movements that happen often), frequent watery, uncontrolled bowel movements . Constipation, (no bowel movements for 3 days) if this happens: Pick one o Milk of Magnesia, 2 tablespoons by mouth, 3 times a day for 2 days if needed o Stop taking Milk of Magnesia once you have a bowel movement o Call your doctor if constipation continues Or o Miralax  (instead of Milk of Magnesia) following the label instructions o Stop taking Miralax once you have a bowel movement o Call your doctor if constipation continues . Anything you think is not normal   Normal side effects after surgery: . Unable to sleep at night or unable to focus . Irritability or moody . Being tearful (crying) or depressed These are common complaints, possibly related to your anesthesia medications that put you to sleep, stress of surgery, and change in lifestyle.  This usually goes away a few weeks after surgery.  If these feelings continue, call your primary care doctor.   Wound Care: You may have surgical glue, steri-strips, or staples over your incisions after  surgery . Surgical glue:  Looks like a clear film over your incisions and will wear off a little at a time . Steri-strips: Strips of tape over your incisions. You may notice a yellowish color on the skin under the steri-strips. This is used to make the   steri-strips stick better. Do not pull the steri-strips off - let them fall off . Staples: Staples may be removed before you leave the hospital o If you go home with staples, call [facility], [phone] at for an appointment with your surgeon's nurse to have staples removed 10 days after surgery. . Showering: You may shower two (2) days after your surgery unless your surgeon tells you differently o Wash gently around incisions with warm soapy water, rinse well, and gently pat dry  o No tub baths until staples are removed, steri-strips fall off or glue is gone.    Medications: . Medications should be liquid or crushed if larger than the size of a dime . Extended release pills (medication that release a little bit at a time through the day) should NOT be crushed or cut. (examples include XL, ER, DR, SR) . Depending on the size and number of medications you take, you may need to space (take a few throughout the day)/change the time you take your medications so that you do not over-fill your pouch (smaller stomach) . Make sure you follow-up with your primary care doctor to   make medication changes needed during rapid weight loss and life-style changes . If you have diabetes, follow up with the doctor that orders your diabetes medication(s) within one week after surgery and check your blood sugar regularly. . Do not drive while taking prescription pain medication  . It is ok to take Tylenol by the bottle instructions with your pain medicine or instead of your pain medicine as needed.  DO NOT TAKE NSAIDS (EXAMPLES OF NSAIDS:  IBUPROFREN/ NAPROXEN)  Diet:                    First 2 Weeks  You will see the dietician t about two (2) weeks  after your surgery. The dietician will increase the types of foods you can eat if you are handling liquids well: . If you have severe vomiting or nausea and cannot keep down clear liquids lasting longer than 1 day, call your surgeon @ ([phone]) Protein Shake . Drink at least 2 ounces of shake 5-6 times per day . Each serving of protein shakes (usually 8 - 12 ounces) should have: o 15 grams of protein  o And no more than 5 grams of carbohydrate  . Goal for protein each day: o Men = 80 grams per day o Women = 60 grams per day . Protein powder may be added to fluids such as non-fat milk or Lactaid milk or unsweetened Soy/Almond milk (limit to 35 grams added protein powder per serving)  Hydration . Slowly increase the amount of water and other clear liquids as tolerated (See Acceptable Fluids) . Slowly increase the amount of protein shake as tolerated  .  Sip fluids slowly and throughout the day.  Do not use straws. . May use sugar substitutes in small amounts (no more than 6 - 8 packets per day; i.e. Splenda)  Fluid Goal . The first goal is to drink at least 8 ounces of protein shake/drink per day (or as directed by the nutritionist); some examples of protein shakes are Syntrax Nectar, Adkins Advantage, EAS Edge HP, and Unjury. See handout from pre-op Bariatric Education Class: o Slowly increase the amount of protein shake you drink as tolerated o You may find it easier to slowly sip shakes throughout the day o It is important to get your proteins in first . Your fluid goal is to drink 64 - 100 ounces of fluid daily o It may take a few weeks to build up to this . 32 oz (or more) should be clear liquids  And  . 32 oz (or more) should be full liquids (see below for examples) . Liquids should not contain sugar, caffeine, or carbonation  Clear Liquids: . Water or Sugar-free flavored water (i.e. Fruit H2O, Propel) . Decaffeinated coffee or tea (sugar-free) . Crystal Lite, Wyler's Lite,  Minute Maid Lite . Sugar-free Jell-O . Bouillon or broth . Sugar-free Popsicle:   *Less than 20 calories each; Limit 1 per day  Full Liquids: Protein Shakes/Drinks + 2 choices per day of other full liquids . Full liquids must be: o No More Than 15 grams of Carbs per serving  o No More Than 3 grams of Fat per serving . Strained low-fat cream soup (except Cream of Potato or Tomato) . Non-Fat milk . Fat-free Lactaid Milk . Unsweetened Soy Or Unsweetened Almond Milk . Low Sugar yogurt (Dannon Lite & Fit, Greek yogurt; Oikos Triple Zero; Chobani Simply 100; Yoplait 100 calorie Greek - No Fruit on the Bottom)    Vitamins   and Minerals . Start 1 day after surgery unless otherwise directed by your surgeon . Chewable Bariatric Specific Multivitamin / Multimineral Supplement with iron (Example: Bariatric Advantage Multi EA) . Chewable Calcium with Vitamin D-3 (Example: 3 Chewable Calcium Plus 600 with Vitamin D-3) o Take 500 mg three (3) times a day for a total of 1500 mg each day o Do not take all 3 doses of calcium at one time as it may cause constipation, and you can only absorb 500 mg  at a time  o Do not mix multivitamins containing iron with calcium supplements; take 2 hours apart . Menstruating women and those with a history of anemia (a blood disease that causes weakness) may need extra iron o Talk with your doctor to see if you need more iron . Do not stop taking or change any vitamins or minerals until you talk to your dietitian or surgeon . Your Dietitian and/or surgeon must approve all vitamin and mineral supplements   Activity and Exercise: Limit your physical activity as instructed by your doctor.  It is important to continue walking at home.  During this time, use these guidelines: . Do not lift anything greater than ten (10) pounds for at least two (2) weeks . Do not go back to work or drive until Designer, industrial/product says you can . You may have sex when you feel comfortable  o It is  VERY important for female patients to use a reliable birth control method; fertility often increases after surgery  o All hormonal birth control will be ineffective for 30 days after surgery due to medications given during surgery a barrier method must be used. o Do not get pregnant for at least 18 months . Start exercising as soon as your doctor tells you that you can o Make sure your doctor approves any physical activity . Start with a simple walking program . Walk 5-15 minutes each day, 7 days per week.  . Slowly increase until you are walking 30-45 minutes per day Consider joining our BELT program. [phone] or email [email or web address]   Special Instructions Things to remember: . Use your CPAP when sleeping if this applies to you  . [facility] has two free Bariatric Surgery Support Groups that meet monthly o The 3rd Thursday of each month, 6 pm o The 2nd Friday of each month, 11:30 . It is very important to keep all follow up appointments with your surgeon, dietitian, primary care physician, and behavioral health practitioner . Routine follow up schedule with your surgeon include appointments at 2-3 weeks, 6-8 weeks, 6 months, and 1 year at a minimum.  Your surgeon may request to see you more often.   . After the first year, please follow up with your bariatric surgeon and dietitian at least once a year in order to maintain best weight loss results   [facility]: [phone] [facility]: [phone] Bariatric Nurse Coordinator: [phone]      Reviewed and Endorsed  by [facility] Patient Education Committee, June, 2016 Edits Approved: Aug, 2018     Your glucose trends are requiring between 4-7 units every time we check your glucose while in the hospital. Check your glucose 4 times a day (before meals and at bedtime)(Continue Freestyle Libre sensor at home if you still have it). If Glucose >200 for 2-3 checks call  PCP you may need a portion of your home medication added back.  For now we will watch your glucose  trends on a sliding scale with your Humalog dose. Your body will still be adjusting after the surgery for some days.

## 2020-05-13 NOTE — Progress Notes (Signed)
Inpatient Diabetes Program Recommendations  AACE/ADA: New Consensus Statement on Inpatient Glycemic Control (2015)  Target Ranges:  Prepandial:   less than 140 mg/dL      Peak postprandial:   less than 180 mg/dL (1-2 hours)      Critically ill patients:  140 - 180 mg/dL   Lab Results  Component Value Date   GLUCAP 220 (H) 05/13/2020   HGBA1C 7.4 (H) 05/07/2020    Review of Glycemic Control  Spoke with pt at bedside regarding DM medications at d/c and glucose parameters. Pt sees Dr. Fransico Him, Endocrinologist in [place], for DM management and already has plans for follow up. Discussed glipizide, sliding scale, and using her freestyle libre for glucose checks at home. Pt has everything she needs at home.   Thanks, Christena Deem RN, MSN, BC-ADM Inpatient Diabetes Coordinator Team Pager [phone] (8a-5p)

## 2020-05-18 ENCOUNTER — Telehealth (HOSPITAL_COMMUNITY): Payer: Self-pay

## 2020-05-18 NOTE — Telephone Encounter (Signed)
Patient called to discuss post bariatric surgery follow up questions.  See below:   1.  Tell me about your pain and pain management? Took one pain pill since discharge  2.  Let's talk about fluid intake.  How much total fluid are you taking in?54 ounces  3.  How much protein have you taken in the last 2 days?60 grams  4.  Have you had nausea?  Tell me about when have experienced nausea and what you did to help?took one nausea pill  5.  Has the frequency or color changed with your urine?urine light in color  6.  Tell me what your incisions look like?no problems  7.  Have you been passing gas? BM?yes loose stools explained when to call due to diarrhea  8.  If a problem or question were to arise who would you call?  Do you know contact numbers for [facility], [facility], and [facility]?aware of all contact informatin  9.  How has the walking going?walking around house regularly  10.  How are your vitamins and calcium going?  How are you taking them?started mvi and calcium no problems  To see endocrinologist Wednesday at 2 pm for post op follow up.  Reminded to stay on phase II diet until follow up with RD

## 2020-05-20 ENCOUNTER — Ambulatory Visit (INDEPENDENT_AMBULATORY_CARE_PROVIDER_SITE_OTHER): Payer: 59 | Admitting: "Endocrinology

## 2020-05-20 ENCOUNTER — Other Ambulatory Visit: Payer: Self-pay

## 2020-05-20 ENCOUNTER — Encounter: Payer: Self-pay | Admitting: "Endocrinology

## 2020-05-20 VITALS — BP 110/82 | HR 84 | Ht 66.0 in | Wt 318.6 lb

## 2020-05-20 DIAGNOSIS — E1165 Type 2 diabetes mellitus with hyperglycemia: Secondary | ICD-10-CM

## 2020-05-20 DIAGNOSIS — I10 Essential (primary) hypertension: Secondary | ICD-10-CM | POA: Diagnosis not present

## 2020-05-20 DIAGNOSIS — E559 Vitamin D deficiency, unspecified: Secondary | ICD-10-CM

## 2020-05-20 MED ORDER — TRESIBA FLEXTOUCH 200 UNIT/ML ~~LOC~~ SOPN: 20.0000 [IU] | PEN_INJECTOR | Freq: Every day | SUBCUTANEOUS | 1 refills | Status: DC

## 2020-05-20 NOTE — Patient Instructions (Signed)

## 2020-05-20 NOTE — Progress Notes (Signed)
Pt has not taken her tresiba nor her glipizide since bariatric surgery on 8/23.

## 2020-05-20 NOTE — Progress Notes (Signed)
05/20/2020  Endocrinology follow-up note   Subjective:    Patient ID: [name] [name], female    DOB: 1965/09/24.  she is being seen in follow-up for the  management of currently uncontrolled symptomatic diabetes requested by  Caryl Bis, MD.   Past Medical History:  Diagnosis Date  . Crohn's colitis ([place])   . Diverticulosis OCT 2011    PAN-COLONIC  . DM (diabetes mellitus) ([place])   . Dyspnea    On exertion  . HTN (hypertension)   . Hyperlipemia   . 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 Obesity, Class III, BMI 40-49.9 (morbid obesity) ([place]) AUG 2010 305 LBS   MAY 2012 267  . Vitamin D deficiency    Past Surgical History:  Procedure Laterality Date  . ABDOMINAL EXPLORATION SURGERY  May 05, 2009 MM   INFLAMMED TI (SEROSITIS), "DIDN'T LOOK LIKE CROHN'S"  . CHOLECYSTECTOMY    . COLONOSCOPY  DEC 2010 WO  . COLONOSCOPY  OCT 2011   MILD ERYTHEMA IN TI (30-40 CM), NL SB/COLON Bx  . ESOPHAGOGASTRODUODENOSCOPY N/A 04/14/2014   Procedure: ESOPHAGOGASTRODUODENOSCOPY (EGD);  Surgeon: Danie Binder, MD;  Location: [facility];  Service: Endoscopy;  Laterality: N/A;  8:30 AM  . ESOPHAGOGASTRODUODENOSCOPY N/A 05/11/2020   Procedure: UPPER GASTROINTESTINAL ENDOSCOPY;  Surgeon: Johnathan Hausen, MD;  Location: [facility];  Service: General;  Laterality: N/A;  . LAPAROSCOPIC GASTRIC BANDING  AUG 2010 BMI 50   GASTRIC POUCH 2-3 CM  . LAPAROSCOPIC GASTRIC SLEEVE RESECTION N/A 05/11/2020   Procedure: LAPAROSCOPIC SLEEVE GASTRECTOMY;  Surgeon: Johnathan Hausen, MD;  Location: [facility];  Service: General;  Laterality: N/A;  . UMBILICAL HERNIA REPAIR  AUG 2010 MATTHEW MARTIN   INCARCERATED OMENTUM  . UPPER GASTROINTESTINAL ENDOSCOPY  MAY 2012 [facility] [employer]   GASTRITIS   Social History   Socioeconomic History  . Marital status: Married    Spouse name: Not on file  . Number of children: Not on file  . Years of education: Not on file  . Highest  education level: Not on file  Occupational History  . Not on file  Tobacco Use  . Smoking status: Never Smoker  . Smokeless tobacco: Never Used  . Tobacco comment: Never smoked  Vaping Use  . Vaping Use: Never used  Substance and Sexual Activity  . Alcohol use: No  . Drug use: No  . Sexual activity: Not on file  Other Topics Concern  . Not on file  Social History Narrative   MARRIED, NO KIDS   Social Determinants of Health   Financial Resource Strain:   . Difficulty of Paying Living Expenses: Not on file  Food Insecurity:   . Worried About Charity fundraiser in the Last Year: Not on file  . Ran Out of Food in the Last Year: Not on file  Transportation Needs:   . Lack of Transportation (Medical): Not on file  . Lack of Transportation (Non-Medical): Not on file  Physical Activity:   . Days of Exercise per Week: Not on file  . Minutes of Exercise per Session: Not on file  Stress:   . Feeling of Stress : Not on file  Social Connections:   . Frequency of Communication with Friends and Family: Not on file  . Frequency of Social Gatherings with Friends and Family: Not on file  . Attends Religious Services: Not on file  . Active Member of Clubs or  Organizations: Not on file  . Attends Archivist Meetings: Not on file  . Marital Status: Not on file   Outpatient Encounter Medications as of 05/20/2020  Medication Sig  . AMLODIPINE BENZOATE PO Take 5 mg by mouth daily.  Marland Kitchen atorvastatin (LIPITOR) 10 MG tablet Take 10 mg by mouth daily.  . Blood Glucose Monitoring Suppl (ONETOUCH VERIO) w/Device KIT 1 each by Does not apply route as needed.  . Continuous Blood Gluc Sensor (FREESTYLE LIBRE 14 DAY SENSOR) MISC USE AS DIRECTED EVERY 14 DAYS  . DULoxetine (CYMBALTA) 30 MG capsule Take 30 mg by mouth daily.  Marland Kitchen gabapentin (NEURONTIN) 300 MG capsule Take 600 mg by mouth at bedtime.  Marland Kitchen glucose blood (ONETOUCH VERIO) test strip USE AS INSTRUCTED TO TEST BLOOD SUGAR FOUR TIMES DAILY   . insulin degludec (TRESIBA FLEXTOUCH) 200 UNIT/ML FlexTouch Pen Inject 20 Units into the skin at bedtime.  . Insulin Pen Needle (B-D ULTRAFINE III SHORT PEN) 31G X 8 MM MISC 1 each by Does not apply route as directed.  . Lancets Thin MISC 1 each by Does not apply route 4 (four) times daily. Use to test blood glucose 4 times daily  . lisinopril (PRINIVIL,ZESTRIL) 40 MG tablet Take 40 mg by mouth daily.  . metoprolol succinate (TOPROL-XL) 25 MG 24 hr tablet Take 25 mg by mouth daily.  . ondansetron (ZOFRAN-ODT) 4 MG disintegrating tablet Take 1 tablet (4 mg total) by mouth every 6 (six) hours as needed for nausea or vomiting.  Marland Kitchen oxyCODONE (OXY IR/ROXICODONE) 5 MG immediate release tablet Take 1 tablet (5 mg total) by mouth every 6 (six) hours as needed for severe pain.  . pantoprazole (PROTONIX) 40 MG tablet Take 1 tablet (40 mg total) by mouth daily.  . potassium chloride (K-DUR) 10 MEQ tablet Take 10 mEq by mouth daily as needed (Take only whe take Torsemide).   . torsemide (DEMADEX) 20 MG tablet Take 20 mg by mouth daily as needed (Excess fluid).  . Vitamin D, Ergocalciferol, (DRISDOL) 1.25 MG (50000 UNIT) CAPS capsule TAKE 1 CAPSULE BY MOUTH EVERY 7 DAYS (Patient taking differently: Take 50,000 Units by mouth every 7 (seven) days. )  . [DISCONTINUED] glipiZIDE (GLUCOTROL XL) 5 MG 24 hr tablet TAKE 1 TABLET(5 MG) BY MOUTH DAILY WITH BREAKFAST (Patient taking differently: Take 5 mg by mouth daily with breakfast. )  . [DISCONTINUED] insulin degludec (TRESIBA FLEXTOUCH) 200 UNIT/ML FlexTouch Pen Inject 64 Units into the skin at bedtime.  . [DISCONTINUED] insulin lispro (HUMALOG) 100 UNIT/ML KwikPen INJECT 18-24 UNITS INTO THE SKIN THREE TIMES DAILY BEFORE A MEAL (Patient taking differently: Inject 3-11 Units into the skin 3 (three) times daily before meals. )   No facility-administered encounter medications on file as of 05/20/2020.    ALLERGIES: No Known Allergies  VACCINATION STATUS:  There  is no immunization history on file for this patient.  Diabetes She presents for her follow-up diabetic visit. She has type 2 diabetes mellitus. Onset time: She was diagnosed at approximate age of 40 years. Her disease course has been improving. There are no hypoglycemic associated symptoms. Pertinent negatives for hypoglycemia include no confusion, headaches, pallor or seizures. Pertinent negatives for diabetes include no chest pain, no fatigue, no polydipsia, no polyphagia and no polyuria. There are no hypoglycemic complications. Symptoms are improving. There are no diabetic complications. Risk factors for coronary artery disease include diabetes mellitus, dyslipidemia, family history, obesity, hypertension and sedentary lifestyle. Current diabetic treatments: She is taking toujeo 50-75  units twice a day,jardiance 25 mg by mouth daily. Her weight is decreasing steadily (She is status post sleeve gastrectomy, lost 25 pounds since last visit.  ). She is following a generally unhealthy diet. When asked about meal planning, she reported none. She has not had a previous visit with a dietitian. She rarely participates in exercise. Her home blood glucose trend is decreasing steadily. Her breakfast blood glucose range is generally 140-180 mg/dl. Her lunch blood glucose range is generally 140-180 mg/dl. Her dinner blood glucose range is generally 140-180 mg/dl. Her bedtime blood glucose range is generally 140-180 mg/dl. Her overall blood glucose range is 140-180 mg/dl. (She presents with her logs and CGM device.  Printouts reviewed and she has 75% time in range.  No hypoglycemia.  This is despite the fact that her Tyler Aas and glipizide were discontinued following her sleeve gastrectomy.  She has lost 25 pounds since her surgery.    Her recent A1c was 7.5% overall improving from 11.6%.    ) An ACE inhibitor/angiotensin II receptor blocker is being taken. Eye exam is current.  Hypertension This is a chronic problem.  The current episode started more than 1 year ago. The problem is controlled. Pertinent negatives include no chest pain, headaches, palpitations or shortness of breath. Risk factors for coronary artery disease include diabetes mellitus, obesity, sedentary lifestyle, family history and dyslipidemia. Past treatments include ACE inhibitors.    Review of systems  Constitutional: + Lost 25 pounds since last visit.  current  Body mass index is 51.42 kg/m. , no fatigue, no subjective hyperthermia, no subjective hypothermia Eyes: no blurry vision, no xerophthalmia ENT: no sore throat, no nodules palpated in throat, no dysphagia/odynophagia, no hoarseness Cardiovascular: no Chest Pain, no Shortness of Breath, no palpitations, no leg swelling Respiratory: no cough, no shortness of breath Gastrointestinal: no Nausea/Vomiting/Diarhhea Musculoskeletal: no muscle/joint aches Skin: no rashes, no hyperemia Neurological: no tremors, no numbness, no tingling, no dizziness Psychiatric: no depression, no anxiety   Objective:    BP 110/82   Pulse 84   Ht 5' 6"  (1.676 m)   Wt (!) 318 lb 9.6 oz (144.5 kg)   LMP 11/01/2015   BMI 51.42 kg/m   Wt Readings from Last 3 Encounters:  05/20/20 (!) 318 lb 9.6 oz (144.5 kg)  05/11/20 (!) 330 lb 3.2 oz (149.8 kg)  05/07/20 (!) 343 lb 1 oz (155.6 kg)     Physical Exam- Limited  Constitutional:  Body mass index is 51.42 kg/m. , not in acute distress, normal state of mind Eyes:  EOMI, no exophthalmos Neck: Supple Thyroid: No gross goiter Respiratory: Adequate breathing efforts Musculoskeletal: no gross deformities, strength intact in all four extremities, no gross restriction of joint movements Skin:  no rashes, no hyperemia Neurological: no tremor with outstretched hands,     CMP     Component Value Date/Time   NA 140 05/07/2020 1355   K 4.3 05/07/2020 1355   CL 103 05/07/2020 1355   CO2 28 05/07/2020 1355   GLUCOSE 213 (H) 05/07/2020 1355   BUN 12  05/07/2020 1355   BUN 12 05/01/2017 0000   CREATININE 0.74 05/11/2020 1812   CREATININE 0.67 07/08/2019 0710   CALCIUM 8.9 05/07/2020 1355   PROT 6.7 05/07/2020 1355   ALBUMIN 3.5 05/07/2020 1355   AST 18 05/07/2020 1355   ALT 20 05/07/2020 1355   ALKPHOS 88 05/07/2020 1355   BILITOT 0.5 05/07/2020 1355   GFRNONAA >60 05/11/2020 1812   GFRNONAA 100 07/08/2019  [place] 05/11/2020 1812   GFRAA 116 07/08/2019 0710    Diabetic Labs (most recent): Lab Results  Component Value Date   HGBA1C 7.4 (H) 05/07/2020   HGBA1C 7.5 (A) 02/05/2020   HGBA1C 7.6 (A) 10/17/2019   Lipid Panel     Component Value Date/Time   CHOL 153 09/26/2018 0733   TRIG 124 09/26/2018 0733   HDL 59 09/26/2018 0733   CHOLHDL 2.6 09/26/2018 0733   VLDL 44 (H) 07/17/2013 0701   LDLCALC 73 09/26/2018 0733    Assessment & Plan:   1. Uncontrolled type 2 diabetes mellitus with hyperglycemia ([place]) - Patient has currently uncontrolled symptomatic type 2 DM since  54 years of age. She presents with her logs and CGM device.  Printouts reviewed and she has 75% time in range.  No hypoglycemia.  This is despite the fact that her Tyler Aas and glipizide were discontinued following her sleeve gastrectomy.  She has lost 25 pounds since her surgery.  Her recent A1c was 7.4% overall improving from 11.6%.     -Recent labs reviewed.  -her diabetes is complicated by obesity/sedentary life and [name] [name] remains at a high risk for more acute and chronic complications which include CAD, CVA, CKD, retinopathy, and neuropathy. These are all discussed in detail with the patient.  - I have counseled her on diet management and weight loss, by adopting a carbohydrate restricted/protein rich diet.  - she  admits there is a room for improvement in her diet and drink choices. -  Suggestion is made for her to avoid simple carbohydrates  from her diet including Cakes, Sweet Desserts / Pastries, Ice Cream, Soda (diet and  regular), Sweet Tea, Candies, Chips, Cookies, Sweet Pastries,  Store Bought Juices, Alcohol in Excess of  1-2 drinks a day, Artificial Sweeteners, Coffee Creamer, and "Sugar-free" Products. This will help patient to have stable blood glucose profile and potentially avoid unintended weight gain.  - I encouraged her to switch to  unprocessed or minimally processed complex starch and increased protein intake (animal or plant source), fruits, and vegetables.  - she is advised to stick to a routine mealtimes to eat 3 meals  a day and avoid unnecessary snacks ( to snack only to correct hypoglycemia).   - I have approached her with the following individualized plan to manage diabetes and patient agrees:   -   Based on her current glycemic profile, in light of her recent sleeve gastrectomy, which allowed her to lose 25 pounds already, she is advised to discontinue Humalog.  She will be continued on lower dose of Tresiba 20 units nightly, discontinue glipizide.  She is allowed and advised to continue to wear her CGM device and report glycemic profile below 70 or above 150 mg per DL.     - She did not tolerate metformin causing GI side effects.  -She did not tolerate Trulicity which was stopped since her last visit.    - Patient specific target  A1c;  LDL, HDL, Triglycerides, were discussed in detail.  2) BP/HTN: Her blood pressure is not controlled to target.  She is advised to continue her current blood pressure medications including lisinopril 40 mg p.o. daily.    3) Lipids/HPL: Her recent lipid panel showed controlled LDL at 53, triglycerides improving to 132 from  222.   Patient is not on  Statins, hesitates to go on statins, will be considered for low-dose statins on subsequent visits.  4)  Weight/Diet: CDE  Consult has been initiated , exercise, and detailed carbohydrates information provided. -She is status post sleeve gastrectomy.   She is advised to maintain close follow-up with her surgeons at  [facility] bariatric surgery.   5) vitamin D deficiency: She is on ongoing treatment with vitamin D2 50,000 units weekly, advised to continue to finish.    6) Chronic Care/Health Maintenance:  -she  is on ACEI/ARB  medications and  is encouraged to continue to follow up with Ophthalmology, Dentist,  Podiatrist at least yearly or according to recommendations, and advised to  stay away from smoking. I have recommended yearly flu vaccine and pneumonia vaccination at least every 5 years; moderate intensity exercise for up to 150 minutes weekly; and  sleep for at least 7 hours a day.  - I advised patient to maintain close follow up with Caryl Bis, MD for primary care needs.     - Time spent on this patient care encounter:  35 min, of which > 50% was spent in  counseling and the rest reviewing her blood glucose logs , discussing her hypoglycemia and hyperglycemia episodes, reviewing her current and  previous labs / studies  ( including abstraction from other facilities) and medications  doses and developing a  long term treatment plan and documenting her care.   Please refer to Patient Instructions for Blood Glucose Monitoring and Insulin/Medications Dosing Guide"  in media tab for additional information. Please  also refer to " Patient Self Inventory" in the Media  tab for reviewed elements of pertinent patient history.  Nunzio Cobbs participated in the discussions, expressed understanding, and voiced agreement with the above plans.  All questions were answered to her satisfaction. she is encouraged to contact clinic should she have any questions or concerns prior to her return visit.  Follow up plan: - Return in about 3 months (around 08/10/2020) for Bring Meter and Logs- A1c in Office.  Glade Lloyd, MD Phone: [phone]  Fax: [phone]   05/20/2020, 4:31 PM This note was partially dictated with voice recognition software. Similar sounding words can be transcribed inadequately or  may not  be corrected upon review.

## 2020-05-26 ENCOUNTER — Encounter: Payer: Managed Care, Other (non HMO) | Attending: Surgery | Admitting: Skilled Nursing Facility1

## 2020-05-26 ENCOUNTER — Other Ambulatory Visit: Payer: Self-pay

## 2020-05-26 DIAGNOSIS — E669 Obesity, unspecified: Secondary | ICD-10-CM | POA: Diagnosis present

## 2020-05-27 NOTE — Progress Notes (Signed)
2 Week Post-Operative Nutrition Class   Patient was seen on 11/13/18 for Post-Operative Nutrition [facility]    Surgery date: 05/11/2020 Surgery type: sleeve Start weight at [facility]: 327.7 Weight today: 314.6      The following the learning objectives were met by the patient during this course:  Identifies Phase 3 (Soft, High Proteins) Dietary Goals and will begin from 2 weeks post-operatively to 2 months post-operatively  Identifies appropriate sources of fluids and proteins   States protein recommendations and appropriate sources post-operatively  Identifies the need for appropriate texture modifications, mastication, and bite sizes when consuming solids  Identifies appropriate multivitamin and calcium sources post-operatively  Describes the need for physical activity post-operatively and will follow MD recommendations  States when to call healthcare provider regarding medication questions or post-operative complications   Handouts given during class include:  Phase 3A: Soft, High Protein Diet Handout   Follow-Up Plan: Patient will follow-up at [facility] in 6 weeks for 2 month post-op nutrition visit for diet advancement per MD.

## 2020-06-02 ENCOUNTER — Telehealth: Payer: Self-pay | Admitting: Skilled Nursing Facility1

## 2020-06-02 NOTE — Telephone Encounter (Signed)
RD called pt to verify fluid intake once starting soft, solid proteins 2 week post-bariatric surgery.   Daily Fluid intake: 50+ Daily Protein intake: 60+  Concerns/issues:   No concerns.

## 2020-06-03 ENCOUNTER — Telehealth: Payer: Self-pay

## 2020-06-03 NOTE — Telephone Encounter (Signed)
Pt left a VM that her sugar has been over 200 the last 2 days.

## 2020-06-03 NOTE — Telephone Encounter (Signed)
I spoke with her to increase tresiba to 30 units qhs.

## 2020-06-03 NOTE — Telephone Encounter (Signed)
Pt's BG for Monday 122 at lunch, 160 at dinner, 169 at bedtime. Tuesday 163 at breakfast, 197 at lunch, 212 at dinner, 201 at bedtime. Today 191 at breakfast, 208 at lunch, 214 at 1:49p.m and 227 at 4:03pm. Pt is taking [place] 20 units daily at bedtime.

## 2020-06-04 NOTE — Telephone Encounter (Signed)
Noted  

## 2020-06-08 NOTE — Telephone Encounter (Signed)
Left a message making pt aware of instructions.

## 2020-06-08 NOTE — Telephone Encounter (Signed)
Pt is calling about high readings.   9/18 - breakfast 179, 206 lunch, 263 dinner, bedtime 167  9/19 - breakfast 179, 200 lunch, 198 dinner, bedtime 236 and at 10 oclockpm 224  9/20- breakfast 249, lunch 225   Please advise.

## 2020-06-08 NOTE — Telephone Encounter (Signed)
Pt is taking Tresiba 30 units daily at bedtime.

## 2020-06-08 NOTE — Telephone Encounter (Signed)
Advise her to increase her Tresiba to 50 units qhs, call back if >200 x 3.

## 2020-06-12 ENCOUNTER — Other Ambulatory Visit: Payer: Self-pay | Admitting: "Endocrinology

## 2020-07-06 ENCOUNTER — Encounter: Payer: Managed Care, Other (non HMO) | Attending: Surgery | Admitting: Skilled Nursing Facility1

## 2020-07-06 ENCOUNTER — Other Ambulatory Visit: Payer: Self-pay

## 2020-07-06 DIAGNOSIS — E669 Obesity, unspecified: Secondary | ICD-10-CM | POA: Diagnosis not present

## 2020-07-06 NOTE — Progress Notes (Signed)
Bariatric Nutrition Follow-Up Visit Medical Nutrition Therapy   2 Months Post-Operative sleeve Surgery Surgery Date: 05/11/2020  NUTRITION ASSESSMENT   Anthropometrics  Surgery date: 05/11/2020 Surgery type: sleeve Start weight at [facility]: 327.7  Body Composition Scale 07/06/2020  Weight  lbs 298.7  Total Body Fat  % 49.4     Visceral Fat 22  Fat-Free Mass  % 50.5     Total Body Water  % 39.7     Muscle-Mass  lbs 32  BMI 49.4  Body Fat Displacement ---        Torso  lbs 91.6        Left Leg  lbs 18.3        Right Leg  lbs 18.3        Left Arm  lbs 9.1        Right Arm  lbs 9.1   Clinical  Medical hx: DM Medications: long acting insulin  Labs:    Lifestyle & Dietary Hx  Pt states she is no longer taking meal time insulin but is taking long acting.  Pt states she is checking her blood sugars 4 times a day: 121-130 fasting; preprandial: 130-150. Pt states her blood sugars have been under 70 about 3 times 64, 67 stating she felt very shaky and confused at that time.  Pt state sometimes she gets cravings and does something else to keep her busy.  Pt states her parents cook for her.  Pt asked if adding non starchy vegetables would cause her to gain weight: Dietitian enforced focusing on health and needing non starchy vegetables for proper nutrition.   Estimated daily fluid intake:  54+ oz Estimated daily protein intake: 60+ g Supplements: cant remember the name of multi and calcium citrate   Current average weekly physical activity: some walking and some hand weights 3-4 days a week  24-Hr Dietary Recall First Meal: egg + [place] sausage  Snack:  Second Meal: deli meat + beans Snack: yogurt Third Meal: fish or chicken or [place] + 1/2 cup beans Snack: cheese Beverages: water, water + flavorings, decaff unsweet tea  Post-Op Goals/ Signs/ Symptoms Using straws: no Drinking while eating: no Chewing/swallowing difficulties: no Changes in vision: no Changes to  mood/headaches: no Hair loss/changes to skin/nails: no Difficulty focusing/concentrating: no Sweating: no Dizziness/lightheadedness: no Palpitations: no  Carbonated/caffeinated beverages: no N/V/D/C/Gas: no Abdominal pain: no Dumping syndrome: no    NUTRITION DIAGNOSIS  Overweight/obesity ([place]-3.3) related to past poor dietary habits and physical inactivity as evidenced by completed bariatric surgery and following dietary guidelines for continued weight loss and healthy nutrition status.     NUTRITION INTERVENTION Nutrition counseling (C-1) and education (E-2) to facilitate bariatric surgery goals, including:  Diet advancement to the next phase (phase 4) now including none starchy vegetables   The importance of consuming adequate calories as well as certain nutrients daily due to the body's need for essential vitamins, minerals, and fats  The importance of daily physical activity and to reach a goal of at least 150 minutes of moderate to vigorous physical activity weekly (or as directed by their physician) due to benefits such as increased musculature and improved lab values  The importance of intuitive eating specifically learning hunger-satiety cues and understanding the importance of learning a new body: The importance of mindful eating to avoid grazing behaviors   Goals: -Continue to aim for a minimum of 64 fluid ounces 7 days a week with at least 30 ounces being plain water -Eat non-starchy vegetables  2 times a day 7 days a week -Start out with soft cooked vegetables today and tomorrow; if tolerated begin to eat raw vegetables or cooked including salads -Eat your 3 ounces of protein first then start in on your non-starchy vegetables; once you understand how much of your meal leads to satisfaction and not full while still eating 3 ounces of protein and non-starchy vegetables you can eat them in any order  -Continue to aim for 30 minutes of activity at least 5 times a week -Do NOT  cook with/add to your food: alfredo sauce, cheese sauce, barbeque sauce, ketchup, fat back, butter, bacon grease, grease, Crisco, OR SUGAR   Handouts Provided Include   Phase 4  Learning Style & Readiness for Change Teaching method utilized: Visual & Auditory  Demonstrated degree of understanding via: Teach Back  Barriers to learning/adherence to lifestyle change: none identified at this time  RD's Notes for Next Visit  Assess adherence to pt chosen goals   MONITORING & EVALUATION Dietary intake, weekly physical activity, body weight  Next Steps Patient is to follow-up in 3 months

## 2020-07-08 NOTE — Telephone Encounter (Signed)
Patient will call in the morning with her readings.

## 2020-07-30 ENCOUNTER — Other Ambulatory Visit: Payer: Self-pay | Admitting: "Endocrinology

## 2020-08-05 ENCOUNTER — Other Ambulatory Visit: Payer: Self-pay | Admitting: "Endocrinology

## 2020-08-05 DIAGNOSIS — E1165 Type 2 diabetes mellitus with hyperglycemia: Secondary | ICD-10-CM

## 2020-08-24 ENCOUNTER — Other Ambulatory Visit: Payer: Self-pay | Admitting: "Endocrinology

## 2020-08-25 ENCOUNTER — Other Ambulatory Visit: Payer: Self-pay

## 2020-08-25 ENCOUNTER — Ambulatory Visit (INDEPENDENT_AMBULATORY_CARE_PROVIDER_SITE_OTHER): Payer: 59 | Admitting: "Endocrinology

## 2020-08-25 ENCOUNTER — Encounter: Payer: Self-pay | Admitting: "Endocrinology

## 2020-08-25 VITALS — BP 126/78 | HR 60 | Ht 66.0 in | Wt 285.0 lb

## 2020-08-25 DIAGNOSIS — I10 Essential (primary) hypertension: Secondary | ICD-10-CM | POA: Diagnosis not present

## 2020-08-25 DIAGNOSIS — E559 Vitamin D deficiency, unspecified: Secondary | ICD-10-CM | POA: Diagnosis not present

## 2020-08-25 DIAGNOSIS — E1165 Type 2 diabetes mellitus with hyperglycemia: Secondary | ICD-10-CM

## 2020-08-25 DIAGNOSIS — Z5320 Procedure and treatment not carried out because of patient's decision for unspecified reasons: Secondary | ICD-10-CM | POA: Diagnosis not present

## 2020-08-25 LAB — POCT GLYCOSYLATED HEMOGLOBIN (HGB A1C): HbA1c, POC (controlled diabetic range): 7.1 % — AB (ref 0.0–7.0)

## 2020-08-25 MED ORDER — FREESTYLE LIBRE 14 DAY SENSOR MISC
5 refills | Status: DC
Start: 2020-08-25 — End: 2021-04-16

## 2020-08-25 NOTE — Patient Instructions (Signed)

## 2020-08-25 NOTE — Progress Notes (Signed)
08/25/2020  Endocrinology follow-up note   Subjective:    Patient ID: [name] [name], female    DOB: 12-06-1965.  she is being seen in follow-up for the  management of currently uncontrolled symptomatic diabetes requested by  Caryl Bis, MD.   Past Medical History:  Diagnosis Date  . Crohn's colitis ([place])   . Diverticulosis OCT 2011    PAN-COLONIC  . DM (diabetes mellitus) ([place])   . Dyspnea    On exertion  . HTN (hypertension)   . Hyperlipemia   . 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 Obesity, Class III, BMI 40-49.9 (morbid obesity) ([place]) AUG 2010 305 LBS   MAY 2012 267  . Vitamin D deficiency    Past Surgical History:  Procedure Laterality Date  . ABDOMINAL EXPLORATION SURGERY  May 05, 2009 MM   INFLAMMED TI (SEROSITIS), "DIDN'T LOOK LIKE CROHN'S"  . CHOLECYSTECTOMY    . COLONOSCOPY  DEC 2010 WO  . COLONOSCOPY  OCT 2011   MILD ERYTHEMA IN TI (30-40 CM), NL SB/COLON Bx  . ESOPHAGOGASTRODUODENOSCOPY N/A 04/14/2014   Procedure: ESOPHAGOGASTRODUODENOSCOPY (EGD);  Surgeon: Danie Binder, MD;  Location: [facility];  Service: Endoscopy;  Laterality: N/A;  8:30 AM  . ESOPHAGOGASTRODUODENOSCOPY N/A 05/11/2020   Procedure: UPPER GASTROINTESTINAL ENDOSCOPY;  Surgeon: Johnathan Hausen, MD;  Location: [facility];  Service: General;  Laterality: N/A;  . LAPAROSCOPIC GASTRIC BANDING  AUG 2010 BMI 50   GASTRIC POUCH 2-3 CM  . LAPAROSCOPIC GASTRIC SLEEVE RESECTION N/A 05/11/2020   Procedure: LAPAROSCOPIC SLEEVE GASTRECTOMY;  Surgeon: Johnathan Hausen, MD;  Location: [facility];  Service: General;  Laterality: N/A;  . UMBILICAL HERNIA REPAIR  AUG 2010 MATTHEW MARTIN   INCARCERATED OMENTUM  . UPPER GASTROINTESTINAL ENDOSCOPY  MAY 2012 [facility] [employer]   GASTRITIS   Social History   Socioeconomic History  . Marital status: Married    Spouse name: Not on file  . Number of children: Not on file  . Years of education: Not on file  . Highest  education level: Not on file  Occupational History  . Not on file  Tobacco Use  . Smoking status: Never Smoker  . Smokeless tobacco: Never Used  . Tobacco comment: Never smoked  Vaping Use  . Vaping Use: Never used  Substance and Sexual Activity  . Alcohol use: No  . Drug use: No  . Sexual activity: Not on file  Other Topics Concern  . Not on file  Social History Narrative   MARRIED, NO KIDS   Social Determinants of Health   Financial Resource Strain:   . Difficulty of Paying Living Expenses: Not on file  Food Insecurity:   . Worried About Charity fundraiser in the Last Year: Not on file  . Ran Out of Food in the Last Year: Not on file  Transportation Needs:   . Lack of Transportation (Medical): Not on file  . Lack of Transportation (Non-Medical): Not on file  Physical Activity:   . Days of Exercise per Week: Not on file  . Minutes of Exercise per Session: Not on file  Stress:   . Feeling of Stress : Not on file  Social Connections:   . Frequency of Communication with Friends and Family: Not on file  . Frequency of Social Gatherings with Friends and Family: Not on file  . Attends Religious Services: Not on file  . Active Member of Clubs or  Organizations: Not on file  . Attends Archivist Meetings: Not on file  . Marital Status: Not on file   Outpatient Encounter Medications as of 08/25/2020  Medication Sig  . AMLODIPINE BENZOATE PO Take 5 mg by mouth daily.  Marland Kitchen atorvastatin (LIPITOR) 10 MG tablet Take 10 mg by mouth daily.  . Blood Glucose Monitoring Suppl (ONETOUCH VERIO) w/Device KIT 1 each by Does not apply route as needed.  . Continuous Blood Gluc Sensor (FREESTYLE LIBRE 14 DAY SENSOR) MISC Use to monitor BG 4 times a day  . DULoxetine (CYMBALTA) 30 MG capsule Take 30 mg by mouth daily.  Marland Kitchen gabapentin (NEURONTIN) 300 MG capsule Take 600 mg by mouth at bedtime.  Marland Kitchen glucose blood (ONETOUCH VERIO) test strip USE AS INSTRUCTED TO TEST BLOOD SUGAR FOUR TIMES  DAILY  . insulin degludec (TRESIBA FLEXTOUCH) 200 UNIT/ML FlexTouch Pen Inject 50 Units into the skin at bedtime.  . Insulin Pen Needle (B-D ULTRAFINE III SHORT PEN) 31G X 8 MM MISC 1 each by Does not apply route as directed.  . Lancets Thin MISC 1 each by Does not apply route 4 (four) times daily. Use to test blood glucose 4 times daily  . lisinopril (PRINIVIL,ZESTRIL) 40 MG tablet Take 40 mg by mouth daily.  . metoprolol succinate (TOPROL-XL) 25 MG 24 hr tablet Take 25 mg by mouth daily.  . ondansetron (ZOFRAN-ODT) 4 MG disintegrating tablet Take 1 tablet (4 mg total) by mouth every 6 (six) hours as needed for nausea or vomiting.  Marland Kitchen oxyCODONE (OXY IR/ROXICODONE) 5 MG immediate release tablet Take 1 tablet (5 mg total) by mouth every 6 (six) hours as needed for severe pain.  . pantoprazole (PROTONIX) 40 MG tablet Take 1 tablet (40 mg total) by mouth daily.  . potassium chloride (K-DUR) 10 MEQ tablet Take 10 mEq by mouth daily as needed (Take only whe take Torsemide).   . torsemide (DEMADEX) 20 MG tablet Take 20 mg by mouth daily as needed (Excess fluid).  . Vitamin D, Ergocalciferol, (DRISDOL) 1.25 MG (50000 UNIT) CAPS capsule TAKE 1 CAPSULE BY MOUTH EVERY 7 DAYS  . [DISCONTINUED] Continuous Blood Gluc Sensor (FREESTYLE LIBRE 14 DAY SENSOR) MISC USE AS DIRECTED EVERY 14 DAYS   No facility-administered encounter medications on file as of 08/25/2020.    ALLERGIES: No Known Allergies  VACCINATION STATUS:  There is no immunization history on file for this patient.  Diabetes She presents for her follow-up diabetic visit. She has type 2 diabetes mellitus. Onset time: She was diagnosed at approximate age of 30 years. Her disease course has been improving. There are no hypoglycemic associated symptoms. Pertinent negatives for hypoglycemia include no confusion, headaches, pallor or seizures. Pertinent negatives for diabetes include no chest pain, no fatigue, no polydipsia, no polyphagia and no  polyuria. There are no hypoglycemic complications. Symptoms are improving. There are no diabetic complications. Risk factors for coronary artery disease include diabetes mellitus, dyslipidemia, family history, obesity, hypertension and sedentary lifestyle. Current diabetic treatments: She is taking toujeo 50-75 units twice a day,jardiance 25 mg by mouth daily. Her weight is decreasing steadily (She is status post sleeve gastrectomy, lost 45 pounds since last visit.  ). She is following a generally unhealthy diet. When asked about meal planning, she reported none. She has not had a previous visit with a dietitian. She rarely participates in exercise. Her home blood glucose trend is decreasing steadily. Her breakfast blood glucose range is generally 140-180 mg/dl. Her lunch blood glucose range  is generally 140-180 mg/dl. Her dinner blood glucose range is generally 140-180 mg/dl. Her bedtime blood glucose range is generally 140-180 mg/dl. Her overall blood glucose range is 140-180 mg/dl. (She presents with her CGM device, showing 75% time range, 24% above range.  No hypoglycemia.  Her point-of-care A1c 7.1%, overall improving from 11.6%.     She has lost 45 pounds since her surgery.   ) An ACE inhibitor/angiotensin II receptor blocker is being taken. Eye exam is current.  Hypertension This is a chronic problem. The current episode started more than 1 year ago. The problem is controlled. Pertinent negatives include no chest pain, headaches, palpitations or shortness of breath. Risk factors for coronary artery disease include diabetes mellitus, obesity, sedentary lifestyle, family history and dyslipidemia. Past treatments include ACE inhibitors.    Review of systems Lost 45 pounds since her bariatric surgery.  Objective:    BP 126/78   Pulse 60   Ht 5' 6"  (1.676 m)   Wt 285 lb (129.3 kg)   LMP 11/01/2015   BMI 46.00 kg/m   Wt Readings from Last 3 Encounters:  08/25/20 285 lb (129.3 kg)  07/06/20 298  lb 11.2 oz (135.5 kg)  05/27/20 (!) 314 lb 9.6 oz (142.7 kg)         CMP     Component Value Date/Time   NA 140 05/07/2020 1355   K 4.3 05/07/2020 1355   CL 103 05/07/2020 1355   CO2 28 05/07/2020 1355   GLUCOSE 213 (H) 05/07/2020 1355   BUN 12 05/07/2020 1355   BUN 12 05/01/2017 0000   CREATININE 0.74 05/11/2020 1812   CREATININE 0.67 07/08/2019 0710   CALCIUM 8.9 05/07/2020 1355   PROT 6.7 05/07/2020 1355   ALBUMIN 3.5 05/07/2020 1355   AST 18 05/07/2020 1355   ALT 20 05/07/2020 1355   ALKPHOS 88 05/07/2020 1355   BILITOT 0.5 05/07/2020 1355   GFRNONAA >60 05/11/2020 1812   GFRNONAA 100 07/08/2019 0710   GFRAA >60 05/11/2020 1812   GFRAA 116 07/08/2019 0710    Diabetic Labs (most recent): Lab Results  Component Value Date   HGBA1C 7.1 (A) 08/25/2020   HGBA1C 7.4 (H) 05/07/2020   HGBA1C 7.5 (A) 02/05/2020   Lipid Panel     Component Value Date/Time   CHOL 153 09/26/2018 0733   TRIG 124 09/26/2018 0733   HDL 59 09/26/2018 0733   CHOLHDL 2.6 09/26/2018 0733   VLDL 44 (H) 07/17/2013 0701   LDLCALC 73 09/26/2018 0733    Assessment & Plan:   1. Uncontrolled type 2 diabetes mellitus with hyperglycemia ([place]) - Patient has currently uncontrolled symptomatic type 2 DM since  54 years of age. She presents with her CGM device, showing 75% time range, 24% above range.  No hypoglycemia.  Her point-of-care A1c 7.1%, overall improving from 11.6%.     She has lost 45 pounds since her surgery.     -Recent labs reviewed.  -her diabetes is complicated by obesity/sedentary life and [name] [name] remains at a high risk for more acute and chronic complications which include CAD, CVA, CKD, retinopathy, and neuropathy. These are all discussed in detail with the patient.  - I have counseled her on diet management and weight loss, by adopting a carbohydrate restricted/protein rich diet. - she  admits there is a room for improvement in her diet and drink choices. -   Suggestion is made for her to avoid simple carbohydrates  from her diet including Cakes,  Sweet Desserts / Pastries, Ice Cream, Soda (diet and regular), Sweet Tea, Candies, Chips, Cookies, Sweet Pastries,  Store Bought Juices, Alcohol in Excess of  1-2 drinks a day, Artificial Sweeteners, Coffee Creamer, and "Sugar-free" Products. This will help patient to have stable blood glucose profile and potentially avoid unintended weight gain.   - I encouraged her to switch to  unprocessed or minimally processed complex starch and increased protein intake (animal or plant source), fruits, and vegetables.  - she is advised to stick to a routine mealtimes to eat 3 meals  a day and avoid unnecessary snacks ( to snack only to correct hypoglycemia).   - I have approached her with the following individualized plan to manage diabetes and patient agrees:   -   Based on her current glycemic profile and in light of her recent sleeve gastrectomy which allowed her to lose 45 pounds already, she will not need prandial insulin.  She is advised to continue Tresiba 50 units nightly associated with monitoring of blood glucose 4 times a day-before meals and at bedtime.  She is advised to continue to wear her CGM device and report glycemic profile below 70 or above 150 mg per DL.     - She did not tolerate metformin causing GI side effects.  -She did not tolerate Trulicity which was stopped since her last visit.    - Patient specific target  A1c;  LDL, HDL, Triglycerides, were discussed in detail.  2) BP/HTN: Her blood pressure is controlled to target.  She is advised to continue her current blood pressure medications including lisinopril 40 mg p.o. daily.    3) Lipids/HPL: Her recent lipid panel showed controlled LDL at 53, triglycerides improving to 132 from  222.   Patient is not on  Statins, hesitates to go on statins, will be considered for low-dose statins on subsequent visits.  4)  Weight/Diet: CDE Consult has been  initiated , exercise, and detailed carbohydrates information provided. -She is status post sleeve gastrectomy.   She lost 45 pounds already. She is advised to maintain close follow-up with her surgeons at [facility] bariatric surgery.   5) vitamin D deficiency: She is on ongoing treatment with vitamin D2 50,000 units weekly, advised to continue to finish.    6) Chronic Care/Health Maintenance:  -she  is on ACEI/ARB  medications and  is encouraged to continue to follow up with Ophthalmology, Dentist,  Podiatrist at least yearly or according to recommendations, and advised to  stay away from smoking. I have recommended yearly flu vaccine and pneumonia vaccination at least every 5 years; moderate intensity exercise for up to 150 minutes weekly; and  sleep for at least 7 hours a day.  - I advised patient to maintain close follow up with Caryl Bis, MD for primary care needs.     - Time spent on this patient care encounter:  35 min, of which > 50% was spent in  counseling and the rest reviewing her blood glucose logs , discussing her hypoglycemia and hyperglycemia episodes, reviewing her current and  previous labs / studies  ( including abstraction from other facilities) and medications  doses and developing a  long term treatment plan and documenting her care.   Please refer to Patient Instructions for Blood Glucose Monitoring and Insulin/Medications Dosing Guide"  in media tab for additional information. Please  also refer to " Patient Self Inventory" in the Media  tab for reviewed elements of pertinent patient history.  Darnelle Bos  Benjaman Kindler participated in the discussions, expressed understanding, and voiced agreement with the above plans.  All questions were answered to her satisfaction. she is encouraged to contact clinic should she have any questions or concerns prior to her return visit.   Follow up plan: - Return in about 6 months (around 02/23/2021) for F/U with Pre-visit Labs, Meter, Logs,  A1c here.Glade Lloyd, MD Phone: [phone]  Fax: [phone]   08/25/2020, 5:05 PM This note was partially dictated with voice recognition software. Similar sounding words can be transcribed inadequately or may not  be corrected upon review.

## 2020-09-07 ENCOUNTER — Other Ambulatory Visit: Payer: Self-pay | Admitting: "Endocrinology

## 2020-09-07 DIAGNOSIS — E1165 Type 2 diabetes mellitus with hyperglycemia: Secondary | ICD-10-CM

## 2020-09-29 ENCOUNTER — Encounter: Payer: 59 | Attending: Surgery | Admitting: Skilled Nursing Facility1

## 2020-09-29 DIAGNOSIS — E669 Obesity, unspecified: Secondary | ICD-10-CM | POA: Insufficient documentation

## 2020-09-29 DIAGNOSIS — E119 Type 2 diabetes mellitus without complications: Secondary | ICD-10-CM | POA: Insufficient documentation

## 2020-09-30 ENCOUNTER — Other Ambulatory Visit: Payer: Self-pay

## 2020-09-30 NOTE — Progress Notes (Signed)
Follow-up visit:  Post-Operative sleeve Surgery  Medical Nutrition Therapy:  Appt start time: 6:00pm end time:  7:00pm  Primary concerns today: Post-operative Bariatric Surgery Nutrition Management 6 Month Post-Op Class  Class done virtually through HIPPA compliant Webex: pt verbalizes the acceptance of continuing with this format understanding the limitations of this visit type: pt was identified through DOB and name    Anthropometrics  Surgery date: 05/11/2020 Surgery type: sleeve Start weight at [facility]: 327.7  Body Composition Scale 07/06/2020  Weight  lbs 298.7  Total Body Fat  % 49.4     Visceral Fat 22  Fat-Free Mass  % 50.5     Total Body Water  % 39.7     Muscle-Mass  lbs 32  BMI 49.4  Body Fat Displacement ---        Torso  lbs 91.6        Left Leg  lbs 18.3        Right Leg  lbs 18.3        Left Arm  lbs 9.1        Right Arm  lbs 9.1    Information Reviewed/ Discussed During Appointment: -Review of composition scale numbers -Fluid requirements (64-100 ounces) -Protein requirements (60-80g) -Strategies for tolerating diet -Advancement of diet to include Starchy vegetables -Barriers to inclusion of new foods -Inclusion of appropriate multivitamin and calcium supplements  -Exercise recommendations   Fluid intake: adequate   Medications: See List Supplementation: appropriate    Using straws: no Drinking while eating: no Having you been chewing well: yes Chewing/swallowing difficulties: no Changes in vision: no Changes to mood/headaches: no Hair loss/Cahnges to skin/Changes to nails: no Any difficulty focusing or concentrating: no Sweating: no Dizziness/Lightheaded: no Palpitations: no  Carbonated beverages: no N/V/D/C/GAS: no Abdominal Pain: no Dumping syndrome: no  Recent physical activity:  ADL's  Progress Towards Goal(s):  In Progress Teaching method utilized: Visual & Auditory  Demonstrated degree of understanding via: Teach Back  Readiness  Level: Action Barriers to learning/adherence to lifestyle change: none identified  Handouts given during visit include:  Phase V diet Progression   Goals Sheet  The Benefits of Exercise are endless.....  Support Group Topics  Teaching Method Utilized:  Visual Auditory Hands on  Demonstrated degree of understanding via:  Teach Back   Monitoring/Evaluation:  Dietary intake, exercise, and body weight. Follow up in 3 months for 9 month post-op visit.

## 2020-11-19 ENCOUNTER — Telehealth: Payer: Self-pay | Admitting: Skilled Nursing Facility1

## 2020-11-19 NOTE — Telephone Encounter (Signed)
Returned pt's call. LVM.

## 2021-01-20 ENCOUNTER — Telehealth: Payer: Self-pay | Admitting: Skilled Nursing Facility1

## 2021-01-20 NOTE — Telephone Encounter (Signed)
Returned pt's call. LVM.

## 2021-01-27 ENCOUNTER — Other Ambulatory Visit: Payer: Self-pay | Admitting: "Endocrinology

## 2021-01-27 DIAGNOSIS — E1165 Type 2 diabetes mellitus with hyperglycemia: Secondary | ICD-10-CM

## 2021-02-11 ENCOUNTER — Other Ambulatory Visit: Payer: Self-pay | Admitting: "Endocrinology

## 2021-02-17 ENCOUNTER — Other Ambulatory Visit: Payer: Self-pay

## 2021-02-17 ENCOUNTER — Encounter: Payer: 59 | Attending: Surgery | Admitting: Skilled Nursing Facility1

## 2021-02-17 DIAGNOSIS — E119 Type 2 diabetes mellitus without complications: Secondary | ICD-10-CM

## 2021-02-17 NOTE — Progress Notes (Signed)
Follow-up visit:  Post-Operative sleeve Surgery   Primary concerns today: Post-operative Bariatric Surgery Nutrition Management   Anthropometrics  Surgery date: 05/11/2020 Surgery type: sleeve Start weight at [facility]: 327.7 Weight: 259.5 lbs  Body Composition Scale 07/06/2020 02/17/2021  Weight  lbs 298.7 259.5  Total Body Fat  % 49.4 46.1     Visceral Fat 22   Fat-Free Mass  % 50.5      Total Body Water  % 39.7 41.8     Muscle-Mass  lbs 32   BMI 49.4   Body Fat Displacement ---         Torso  lbs 91.6         Left Leg  lbs 18.3         Right Leg  lbs 18.3         Left Arm  lbs 9.1         Right Arm  lbs 9.1     Pt state she is really frustrated because she has not lost any weight and has stalled being at her current weight for about 3 months.  Pt states her dad cooks most of her meals for her.  Pt state she weighs her meat on the scale aiming for 3 ounces but has been weighing it before she cooks it: dietitian educated pt on this.  Pt states her blood sugars have been good with a n A1C of 6.9 and blood sugars about 87 checking them 4 times a day.  Pt states her blood sugars have been down in the 70's a couple times a month symptoms being sluggish to correct eating hershey chocolate 2 miniatures.  Pt states she eats out once a month.   24 hr recall: First meal: 1 egg + 1 bacon (olive oil spray)  snack: Second meal: 1 [place] or ham or roast beef + cheese wrap + mustard sometimes mayo lightly spread on it sometimes with pickles or lettuce  Snack: 6-7 cheese cubes + 28 almonds  Third meal: pork chop + dry seasoning or fish + green beans or cauliflower + small pat of butter or dipped in ranch or cucumbers  Snack: yogurt or almonds + cheese  Fluid intake: adequate: water, water + flavoring  Medications: See List Supplementation: multi and calcium    Using straws: no Drinking while eating: no Having you been chewing well: yes Chewing/swallowing difficulties: no Changes in  vision: no Changes to mood/headaches: no Hair loss/Cahnges to skin/Changes to nails: no Any difficulty focusing or concentrating: no Sweating: no Dizziness/Lightheaded: no Palpitations: no  Carbonated beverages: no N/V/D/C/GAS: no Abdominal Pain: no Dumping syndrome: no  Recent physical activity:  inconsistent   Goals: -aim for at least half your fluid to be water -Add in complex carbohydrate at least 2 times a day such as fruit or beans or potato -Aim to be active with purpose 4-5 days a week for at least 30 minutes getting sweaty and  Out of breath   Progress Towards Goal(s):  In Progress Teaching method utilized: Patent attorney & Auditory  Demonstrated degree of understanding via: Teach Back  Readiness Level: Action Barriers to learning/adherence to lifestyle change: none identified  Teaching Method Utilized:  Visual Auditory Hands on  Demonstrated degree of understanding via:  Teach Back   Monitoring/Evaluation:  Dietary intake, exercise, and body weight

## 2021-02-18 LAB — VITAMIN D 25 HYDROXY (VIT D DEFICIENCY, FRACTURES): Vit D, 25-Hydroxy: 87.2 ng/mL (ref 30.0–100.0)

## 2021-02-18 LAB — COMPREHENSIVE METABOLIC PANEL
ALT: 14 IU/L (ref 0–32)
AST: 14 IU/L (ref 0–40)
Albumin/Globulin Ratio: 1.7 (ref 1.2–2.2)
Albumin: 4.1 g/dL (ref 3.8–4.9)
Alkaline Phosphatase: 87 IU/L (ref 44–121)
BUN/Creatinine Ratio: 30 — ABNORMAL HIGH (ref 9–23)
BUN: 20 mg/dL (ref 6–24)
Bilirubin Total: 0.3 mg/dL (ref 0.0–1.2)
CO2: 25 mmol/L (ref 20–29)
Calcium: 9.3 mg/dL (ref 8.7–10.2)
Chloride: 105 mmol/L (ref 96–106)
Creatinine, Ser: 0.66 mg/dL (ref 0.57–1.00)
Globulin, Total: 2.4 g/dL (ref 1.5–4.5)
Glucose: 77 mg/dL (ref 65–99)
Potassium: 4.6 mmol/L (ref 3.5–5.2)
Sodium: 145 mmol/L — ABNORMAL HIGH (ref 134–144)
Total Protein: 6.5 g/dL (ref 6.0–8.5)
eGFR: 104 mL/min/{1.73_m2} (ref >59)

## 2021-02-18 LAB — T4, FREE: Free T4: 1.2 ng/dL (ref 0.82–1.77)

## 2021-02-18 LAB — TSH: TSH: 3.39 u[IU]/mL (ref 0.450–4.500)

## 2021-02-24 ENCOUNTER — Other Ambulatory Visit: Payer: Self-pay

## 2021-02-24 ENCOUNTER — Encounter: Payer: Self-pay | Admitting: "Endocrinology

## 2021-02-24 ENCOUNTER — Ambulatory Visit (INDEPENDENT_AMBULATORY_CARE_PROVIDER_SITE_OTHER): Payer: 59 | Admitting: "Endocrinology

## 2021-02-24 VITALS — BP 136/78 | HR 68 | Ht 66.0 in | Wt 255.4 lb

## 2021-02-24 DIAGNOSIS — E559 Vitamin D deficiency, unspecified: Secondary | ICD-10-CM | POA: Diagnosis not present

## 2021-02-24 DIAGNOSIS — I10 Essential (primary) hypertension: Secondary | ICD-10-CM

## 2021-02-24 DIAGNOSIS — E1165 Type 2 diabetes mellitus with hyperglycemia: Secondary | ICD-10-CM | POA: Diagnosis not present

## 2021-02-24 LAB — POCT GLYCOSYLATED HEMOGLOBIN (HGB A1C): HbA1c, POC (controlled diabetic range): 6.6 % (ref 0.0–7.0)

## 2021-02-24 MED ORDER — VITAMIN D (ERGOCALCIFEROL) 1.25 MG (50000 UNIT) PO CAPS
1.0000 | ORAL_CAPSULE | ORAL | 0 refills | Status: DC
Start: 2021-02-24 — End: 2021-07-28

## 2021-02-24 NOTE — Progress Notes (Signed)
02/24/2021  Endocrinology follow-up note   Subjective:    Patient ID: [name] [name], female    DOB: 1966/07/24.  she is being seen in follow-up for the  management of currently uncontrolled symptomatic diabetes requested by  Caryl Bis, MD.   Past Medical History:  Diagnosis Date  . Crohn's colitis ([place])   . Diverticulosis OCT 2011    PAN-COLONIC  . DM (diabetes mellitus) ([place])   . Dyspnea    On exertion  . HTN (hypertension)   . Hyperlipemia   . 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 Obesity, Class III, BMI 40-49.9 (morbid obesity) ([place]) AUG 2010 305 LBS   MAY 2012 267  . Vitamin D deficiency    Past Surgical History:  Procedure Laterality Date  . ABDOMINAL EXPLORATION SURGERY  May 05, 2009 MM   INFLAMMED TI (SEROSITIS), "DIDN'T LOOK LIKE CROHN'S"  . CHOLECYSTECTOMY    . COLONOSCOPY  DEC 2010 WO  . COLONOSCOPY  OCT 2011   MILD ERYTHEMA IN TI (30-40 CM), NL SB/COLON Bx  . ESOPHAGOGASTRODUODENOSCOPY N/A 04/14/2014   Procedure: ESOPHAGOGASTRODUODENOSCOPY (EGD);  Surgeon: Danie Binder, MD;  Location: [facility];  Service: Endoscopy;  Laterality: N/A;  8:30 AM  . ESOPHAGOGASTRODUODENOSCOPY N/A 05/11/2020   Procedure: UPPER GASTROINTESTINAL ENDOSCOPY;  Surgeon: Johnathan Hausen, MD;  Location: [facility];  Service: General;  Laterality: N/A;  . LAPAROSCOPIC GASTRIC BANDING  AUG 2010 BMI 50   GASTRIC POUCH 2-3 CM  . LAPAROSCOPIC GASTRIC SLEEVE RESECTION N/A 05/11/2020   Procedure: LAPAROSCOPIC SLEEVE GASTRECTOMY;  Surgeon: Johnathan Hausen, MD;  Location: [facility];  Service: General;  Laterality: N/A;  . UMBILICAL HERNIA REPAIR  AUG 2010 MATTHEW MARTIN   INCARCERATED OMENTUM  . UPPER GASTROINTESTINAL ENDOSCOPY  MAY 2012 [facility]LE [employer]ACEMENT   GASTRITIS   Social History   Socioeconomic History  . Marital status: Married    Spouse name: Not on file  . Number of children: Not on file  . Years of education: Not on file  . Highest  education level: Not on file  Occupational History  . Not on file  Tobacco Use  . Smoking status: Never Smoker  . Smokeless tobacco: Never Used  . Tobacco comment: Never smoked  Vaping Use  . Vaping Use: Never used  Substance and Sexual Activity  . Alcohol use: No  . Drug use: No  . Sexual activity: Not on file  Other Topics Concern  . Not on file  Social History Narrative   MARRIED, NO KIDS   Social Determinants of Health   Financial Resource Strain: Not on file  Food Insecurity: Not on file  Transportation Needs: Not on file  Physical Activity: Not on file  Stress: Not on file  Social Connections: Not on file   Outpatient Encounter Medications as of 02/24/2021  Medication Sig  . AMLODIPINE BENZOATE PO Take 5 mg by mouth daily.  Marland Kitchen atorvastatin (LIPITOR) 10 MG tablet Take 10 mg by mouth daily.  . B-D UF III MINI PEN NEEDLES 31G X 5 MM MISC USE AS DIRECTED WITH INSULIN INJECTIONS  . Blood Glucose Monitoring Suppl (ONETOUCH VERIO) w/Device KIT 1 each by Does not apply route as needed.  . Continuous Blood Gluc Sensor (FREESTYLE LIBRE 14 DAY SENSOR) MISC Use to monitor BG 4 times a day  . DULoxetine (CYMBALTA) 30 MG capsule Take 30 mg by mouth daily.  Marland Kitchen gabapentin (NEURONTIN) 300 MG capsule  Take 600 mg by mouth at bedtime.  Marland Kitchen glucose blood (ONETOUCH VERIO) test strip USE AS INSTRUCTED TO TEST BLOOD SUGAR FOUR TIMES DAILY  . Lancets Thin MISC 1 each by Does not apply route 4 (four) times daily. Use to test blood glucose 4 times daily  . lisinopril (PRINIVIL,ZESTRIL) 40 MG tablet Take 40 mg by mouth daily.  . metoprolol succinate (TOPROL-XL) 25 MG 24 hr tablet Take 25 mg by mouth daily.  . ondansetron (ZOFRAN-ODT) 4 MG disintegrating tablet Take 1 tablet (4 mg total) by mouth every 6 (six) hours as needed for nausea or vomiting. (Patient not taking: Reported on 02/24/2021)  . oxyCODONE (OXY IR/ROXICODONE) 5 MG immediate release tablet Take 1 tablet (5 mg total) by mouth every 6 (six)  hours as needed for severe pain. (Patient not taking: Reported on 02/24/2021)  . pantoprazole (PROTONIX) 40 MG tablet Take 1 tablet (40 mg total) by mouth daily.  . potassium chloride (K-DUR) 10 MEQ tablet Take 10 mEq by mouth daily as needed (Take only whe take Torsemide).   . torsemide (DEMADEX) 20 MG tablet Take 20 mg by mouth daily as needed (Excess fluid).  . TRESIBA FLEXTOUCH 200 UNIT/ML FlexTouch Pen ADMINISTER 50 UNITS UNDER THE SKIN AT BEDTIME  . Vitamin D, Ergocalciferol, (DRISDOL) 1.25 MG (50000 UNIT) CAPS capsule Take 1 capsule (50,000 Units total) by mouth every 30 (thirty) days.  . [DISCONTINUED] Vitamin D, Ergocalciferol, (DRISDOL) 1.25 MG (50000 UNIT) CAPS capsule TAKE 1 CAPSULE BY MOUTH EVERY 7 DAYS   No facility-administered encounter medications on file as of 02/24/2021.    ALLERGIES: No Known Allergies  VACCINATION STATUS:  There is no immunization history on file for this patient.  Diabetes She presents for her follow-up diabetic visit. She has type 2 diabetes mellitus. Onset time: She was diagnosed at approximate age of 13 years. Her disease course has been improving. There are no hypoglycemic associated symptoms. Pertinent negatives for hypoglycemia include no confusion, headaches, pallor or seizures. Pertinent negatives for diabetes include no chest pain, no fatigue, no polydipsia, no polyphagia and no polyuria. There are no hypoglycemic complications. Symptoms are improving. There are no diabetic complications. Risk factors for coronary artery disease include diabetes mellitus, dyslipidemia, family history, obesity, hypertension and sedentary lifestyle. Current diabetic treatments: She is taking toujeo 50-75 units twice a day,jardiance 25 mg by mouth daily. Her weight is decreasing steadily (She is status post sleeve gastrectomy, lost 60pounds since last visit.  ). She is following a generally unhealthy diet. When asked about meal planning, she reported none. She has not had a  previous visit with a dietitian. She rarely participates in exercise. Her home blood glucose trend is decreasing steadily. Her breakfast blood glucose range is generally 130-140 mg/dl. Her lunch blood glucose range is generally 130-140 mg/dl. Her dinner blood glucose range is generally 130-140 mg/dl. Her bedtime blood glucose range is generally 130-140 mg/dl. Her overall blood glucose range is 130-140 mg/dl. (She presents with her CGM device.  AGP report shows 77% time range, 22% above range.  No major hypoglycemia.  Her point-of-care A1c 6.6%, overall improving from 11.6%.  She has lost approximately 60 pounds since her surgery.  Her average blood glucose 151.) An ACE inhibitor/angiotensin II receptor blocker is being taken. Eye exam is current.  Hypertension This is a chronic problem. The current episode started more than 1 year ago. The problem is controlled. Pertinent negatives include no chest pain, headaches, palpitations or shortness of breath. Risk factors for coronary  artery disease include diabetes mellitus, obesity, sedentary lifestyle, family history and dyslipidemia. Past treatments include ACE inhibitors.    Review of systems Lost 45 pounds since her bariatric surgery.  Objective:    BP 136/78   Pulse 68   Ht 5' 6"  (1.676 m)   Wt 255 lb 6.4 oz (115.8 kg)   LMP 11/01/2015   BMI 41.22 kg/m   Wt Readings from Last 3 Encounters:  02/24/21 255 lb 6.4 oz (115.8 kg)  02/17/21 259 lb 8 oz (117.7 kg)  08/25/20 285 lb (129.3 kg)         CMP     Component Value Date/Time   NA 145 (H) 02/17/2021 0826   K 4.6 02/17/2021 0826   CL 105 02/17/2021 0826   CO2 25 02/17/2021 0826   GLUCOSE 77 02/17/2021 0826   GLUCOSE 213 (H) 05/07/2020 1355   BUN 20 02/17/2021 0826   CREATININE 0.66 02/17/2021 0826   CREATININE 0.67 07/08/2019 0710   CALCIUM 9.3 02/17/2021 0826   PROT 6.5 02/17/2021 0826   ALBUMIN 4.1 02/17/2021 0826   AST 14 02/17/2021 0826   ALT 14 02/17/2021 0826   ALKPHOS  87 02/17/2021 0826   BILITOT 0.3 02/17/2021 0826   GFRNONAA >60 05/11/2020 1812   GFRNONAA 100 07/08/2019 0710   GFRAA >60 05/11/2020 1812   GFRAA 116 07/08/2019 0710    Diabetic Labs (most recent): Lab Results  Component Value Date   HGBA1C 6.6 02/24/2021   HGBA1C 7.1 (A) 08/25/2020   HGBA1C 7.4 (H) 05/07/2020   Lipid Panel     Component Value Date/Time   CHOL 153 09/26/2018 0733   TRIG 124 09/26/2018 0733   HDL 59 09/26/2018 0733   CHOLHDL 2.6 09/26/2018 0733   VLDL 44 (H) 07/17/2013 0701   LDLCALC 73 09/26/2018 0733    Assessment & Plan:   1. Uncontrolled type 2 diabetes mellitus with hyperglycemia ([place]) - Patient has currently uncontrolled symptomatic type 2 DM since  55 years of age.  she presents with her CGM device.  AGP report shows 77% time range, 22% above range.  No major hypoglycemia.  Her point-of-care A1c 6.6%, overall improving from 11.6%.  She has lost approximately 60 pounds since her surgery.  Her average blood glucose 151.    -Recent labs reviewed.  -her diabetes is complicated by obesity/sedentary life and [name] [name] remains at a high risk for more acute and chronic complications which include CAD, CVA, CKD, retinopathy, and neuropathy. These are all discussed in detail with the patient.  - I have counseled her on diet management and weight loss, by adopting a carbohydrate restricted/protein rich diet.  - she acknowledges that there is a room for improvement in her food and drink choices. - Suggestion is made for her to avoid simple carbohydrates  from her diet including Cakes, Sweet Desserts, Ice Cream, Soda (diet and regular), Sweet Tea, Candies, Chips, Cookies, Store Bought Juices, Alcohol in Excess of  1-2 drinks a day, Artificial Sweeteners,  Coffee Creamer, and "Sugar-free" Products, Lemonade. This will help patient to have more stable blood glucose profile and potentially avoid unintended weight gain.   - I encouraged her to switch to   unprocessed or minimally processed complex starch and increased protein intake (animal or plant source), fruits, and vegetables.  - she is advised to stick to a routine mealtimes to eat 3 meals  a day and avoid unnecessary snacks ( to snack only to correct hypoglycemia).   -  I have approached her with the following individualized plan to manage diabetes and patient agrees:   -   Based on her current glycemic profile and in light of her recent sleeve gastrectomy which allowed her to lose 60 pounds already, she will not need prandial insulin.  She is advised to lower [place] to 40 units nightly, associated with monitoring of blood glucose 4 times a day-before meals and at bedtime.  She is advised to continue to wear her CGM device and report glycemic profile below 70 or above 150 mg per DL.     - She did not tolerate metformin causing GI side effects.  -She did not tolerate Trulicity which was stopped since her last visit.    - Patient specific target  A1c;  LDL, HDL, Triglycerides, were discussed in detail.  2) BP/HTN: -Her blood pressure is controlled to target.  She is advised to continue her current blood pressure medications including lisinopril 40 mg p.o. daily.    3) Lipids/HPL: Her recent lipid panel showed controlled LDL at 53, triglycerides improving to 132 from  222.   Patient is not on  Statins, hesitates to go on statins, will be considered for low-dose statins on subsequent visits.  4)  Weight/Diet: CDE Consult has been initiated , exercise, and detailed carbohydrates information provided. -She is status post sleeve gastrectomy.   She lost 45 pounds already. She is advised to maintain close follow-up with her surgeons at [facility] bariatric surgery.   5) vitamin D deficiency: She is on ongoing treatment with vitamin D2 50,000 units weekly, advised to continue to finish.    6) Chronic Care/Health Maintenance:  -she  is on ACEI/ARB  medications and  is encouraged to continue  to follow up with Ophthalmology, Dentist,  Podiatrist at least yearly or according to recommendations, and advised to  stay away from smoking. I have recommended yearly flu vaccine and pneumonia vaccination at least every 5 years; moderate intensity exercise for up to 150 minutes weekly; and  sleep for at least 7 hours a day.  - I advised patient to maintain close follow up with Caryl Bis, MD for primary care needs.     I spent 41 minutes in the care of the patient today including review of labs from [place], Lipids, Thyroid Function, Hematology (current and previous including abstractions from other facilities); face-to-face time discussing  her blood glucose readings/logs, discussing hypoglycemia and hyperglycemia episodes and symptoms, medications doses, her options of short and long term treatment based on the latest standards of care / guidelines;  discussion about incorporating lifestyle medicine;  and documenting the encounter.    Please refer to Patient Instructions for Blood Glucose Monitoring and Insulin/Medications Dosing Guide"  in media tab for additional information. Please  also refer to " Patient Self Inventory" in the Media  tab for reviewed elements of pertinent patient history.  Nunzio Cobbs participated in the discussions, expressed understanding, and voiced agreement with the above plans.  All questions were answered to her satisfaction. she is encouraged to contact clinic should she have any questions or concerns prior to her return visit.   Follow up plan: - Return in about 6 months (around 08/26/2021) for F/U with Pre-visit Labs, Meter, Logs, A1c here.Glade Lloyd, MD Phone: [phone]  Fax: [phone]   02/24/2021, 5:12 PM This note was partially dictated with voice recognition software. Similar sounding words can be transcribed inadequately or may not  be corrected upon review.

## 2021-02-24 NOTE — Patient Instructions (Signed)

## 2021-03-08 ENCOUNTER — Telehealth: Payer: Self-pay | Admitting: Skilled Nursing Facility1

## 2021-03-08 NOTE — Telephone Encounter (Signed)
Returned pts call.   Pt asked what complex carbohydrates count: dietitian advised: fruit, beans, peas, corn, potato, and low sugar oatmeal but to wait on pasta and bread until august.

## 2021-03-30 ENCOUNTER — Telehealth: Payer: Self-pay | Admitting: "Endocrinology

## 2021-03-30 NOTE — Telephone Encounter (Signed)
Pt is calling to discuss her high readings, pt states she usually test before each meal and at bedtime. Some days.. she test multiple times between.  7/9 159, 192, 113, 223  7/10 86, 181, 182, 289 at 7p 223 before bed  7/11 137, 158, 187, 192 before bed 171  7/12 115, lunch 205, after lunch 232, now 240.   Pt states the days before 7/9 her sugar has been in the 300s.   [phone]

## 2021-03-30 NOTE — Telephone Encounter (Signed)
Lft msg for pt to call back 

## 2021-03-30 NOTE — Telephone Encounter (Signed)
It appears she is on Tresiba 40 units nightly (decreased at last visit).  Has anything happened lately to cause higher numbers?  Any recent steroid use?  Any recent infections?  Both can cause elevations in blood glucose which can correct (after resolution of the causative problem or medications) without any adjustments to her meds.  Looks like she may be leveling back out.  Tell her not to worry too much about the readings after meals, we expect them to be higher.  I am cautious to increase her insulin much given that she is still losing weight from the gastric sleeve which may cause low glucose readings.  My recommendations would be to monitor for the next few days and reach back out if her glucose readings do not improve or worsen.

## 2021-03-30 NOTE — Telephone Encounter (Signed)
FYI  Pt states no changes except she hasn't been losing any weight the past couple of weeks. She says she will continue to monitor and call us back in few days with the numbers.

## 2021-04-15 ENCOUNTER — Other Ambulatory Visit: Payer: Self-pay | Admitting: "Endocrinology

## 2021-04-29 ENCOUNTER — Ambulatory Visit: Payer: Managed Care, Other (non HMO) | Admitting: Skilled Nursing Facility1

## 2021-05-22 ENCOUNTER — Other Ambulatory Visit: Payer: Self-pay | Admitting: "Endocrinology

## 2021-05-22 DIAGNOSIS — E1165 Type 2 diabetes mellitus with hyperglycemia: Secondary | ICD-10-CM

## 2021-07-05 ENCOUNTER — Other Ambulatory Visit: Payer: Self-pay | Admitting: "Endocrinology

## 2021-07-05 DIAGNOSIS — E1165 Type 2 diabetes mellitus with hyperglycemia: Secondary | ICD-10-CM

## 2021-07-12 ENCOUNTER — Other Ambulatory Visit: Payer: Self-pay | Admitting: "Endocrinology

## 2021-07-22 ENCOUNTER — Other Ambulatory Visit: Payer: Self-pay | Admitting: "Endocrinology

## 2021-07-22 DIAGNOSIS — E1165 Type 2 diabetes mellitus with hyperglycemia: Secondary | ICD-10-CM

## 2021-07-27 LAB — COMPREHENSIVE METABOLIC PANEL
ALT: 21 IU/L (ref 0–32)
AST: 34 IU/L (ref 0–40)
Albumin/Globulin Ratio: 1.5 (ref 1.2–2.2)
Albumin: 4.1 g/dL (ref 3.8–4.9)
Alkaline Phosphatase: 71 IU/L (ref 44–121)
BUN/Creatinine Ratio: 32 — ABNORMAL HIGH (ref 9–23)
BUN: 32 mg/dL — ABNORMAL HIGH (ref 6–24)
Bilirubin Total: 0.4 mg/dL (ref 0.0–1.2)
CO2: 25 mmol/L (ref 20–29)
Calcium: 9.8 mg/dL (ref 8.7–10.2)
Chloride: 104 mmol/L (ref 96–106)
Creatinine, Ser: 1 mg/dL (ref 0.57–1.00)
Globulin, Total: 2.7 g/dL (ref 1.5–4.5)
Glucose: 78 mg/dL (ref 70–99)
Potassium: 4.6 mmol/L (ref 3.5–5.2)
Sodium: 145 mmol/L — ABNORMAL HIGH (ref 134–144)
Total Protein: 6.8 g/dL (ref 6.0–8.5)
eGFR: 67 mL/min/{1.73_m2} (ref >59)

## 2021-07-27 LAB — LIPID PANEL
Chol/HDL Ratio: 2.2 ratio (ref 0.0–4.4)
Cholesterol, Total: 147 mg/dL (ref 100–199)
HDL: 67 mg/dL (ref >39)
LDL Chol Calc (NIH): 65 mg/dL (ref 0–99)
Triglycerides: 77 mg/dL (ref 0–149)
VLDL Cholesterol Cal: 15 mg/dL (ref 5–40)

## 2021-07-27 LAB — VITAMIN D 25 HYDROXY (VIT D DEFICIENCY, FRACTURES): Vit D, 25-Hydroxy: 79.8 ng/mL (ref 30.0–100.0)

## 2021-07-27 LAB — VITAMIN B12: Vitamin B-12: >2000 pg/mL — ABNORMAL HIGH (ref 232–1245)

## 2021-07-28 ENCOUNTER — Other Ambulatory Visit: Payer: Self-pay

## 2021-07-28 ENCOUNTER — Ambulatory Visit (INDEPENDENT_AMBULATORY_CARE_PROVIDER_SITE_OTHER): Payer: 59 | Admitting: "Endocrinology

## 2021-07-28 ENCOUNTER — Encounter: Payer: Self-pay | Admitting: "Endocrinology

## 2021-07-28 VITALS — BP 118/80 | HR 60 | Ht 66.0 in | Wt 253.8 lb

## 2021-07-28 DIAGNOSIS — Z5320 Procedure and treatment not carried out because of patient's decision for unspecified reasons: Secondary | ICD-10-CM

## 2021-07-28 DIAGNOSIS — I10 Essential (primary) hypertension: Secondary | ICD-10-CM

## 2021-07-28 DIAGNOSIS — E1165 Type 2 diabetes mellitus with hyperglycemia: Secondary | ICD-10-CM | POA: Diagnosis not present

## 2021-07-28 DIAGNOSIS — E559 Vitamin D deficiency, unspecified: Secondary | ICD-10-CM

## 2021-07-28 DIAGNOSIS — E678 Other specified hyperalimentation: Secondary | ICD-10-CM

## 2021-07-28 LAB — POCT GLYCOSYLATED HEMOGLOBIN (HGB A1C): HbA1c, POC (controlled diabetic range): 6.7 % (ref 0.0–7.0)

## 2021-07-28 MED ORDER — TRULICITY 1.5 MG/0.5ML ~~LOC~~ SOAJ: 1.5000 mg | SUBCUTANEOUS | 2 refills | Status: DC

## 2021-07-28 NOTE — Progress Notes (Signed)
02/24/2021  Endocrinology follow-up note   Subjective:    Patient ID: [name] [name], female    DOB: 01/22/1966.  she is being seen in follow-up for the  management of currently uncontrolled symptomatic diabetes requested by  Caryl Bis, MD.   Past Medical History:  Diagnosis Date   Crohn's colitis 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 CE: TI ULCER   ASCA 4.3 NL pANCA 7.4   Obesity, Class III, BMI 40-49.9 (morbid obesity) ([place]) AUG 2010 305 LBS   MAY 2012 267   Vitamin D deficiency    Past Surgical History:  Procedure Laterality Date   ABDOMINAL EXPLORATION SURGERY  May 05, 2009 MM   INFLAMMED TI (SEROSITIS), "DIDN'T LOOK LIKE CROHN'S"   CHOLECYSTECTOMY     COLONOSCOPY  DEC 2010 WO   COLONOSCOPY  OCT 2011   MILD ERYTHEMA IN TI (30-40 CM), NL SB/COLON Bx   ESOPHAGOGASTRODUODENOSCOPY N/A 04/14/2014   Procedure: ESOPHAGOGASTRODUODENOSCOPY (EGD);  Surgeon: Danie Binder, MD;  Location: [facility];  Service: Endoscopy;  Laterality: N/A;  8:30 AM   ESOPHAGOGASTRODUODENOSCOPY N/A 05/11/2020   Procedure: UPPER GASTROINTESTINAL ENDOSCOPY;  Surgeon: Johnathan Hausen, MD;  Location: [facility];  Service: General;  Laterality: N/A;   LAPAROSCOPIC GASTRIC BANDING  AUG 2010 BMI 50   GASTRIC POUCH 2-3 CM   LAPAROSCOPIC GASTRIC SLEEVE RESECTION N/A 05/11/2020   Procedure: LAPAROSCOPIC SLEEVE GASTRECTOMY;  Surgeon: Johnathan Hausen, MD;  Location: [facility];  Service: General;  Laterality: N/A;   UMBILICAL HERNIA REPAIR  AUG 2010 MATTHEW MARTIN   INCARCERATED OMENTUM   UPPER GASTROINTESTINAL ENDOSCOPY  MAY 2012 GIVENS CAPSULE PLACEMENT   GASTRITIS   Social History   Socioeconomic History   Marital status: Married    Spouse name: Not on file   Number of children: Not on file   Years of education: Not on file   Highest education level: Not on file   Occupational History   Not on file  Tobacco Use   Smoking status: Never Smoker   Smokeless tobacco: Never Used   Tobacco comment: Never smoked  Vaping Use   Vaping Use: Never used  Substance and Sexual Activity   Alcohol use: No   Drug use: No   Sexual activity: Not on file  Other Topics Concern   Not on file  Social History Narrative   MARRIED, NO KIDS   Social Determinants of Health   Financial Resource Strain: Not on file  Food Insecurity: Not on file  Transportation Needs: Not on file  Physical Activity: Not on file  Stress: Not on file  Social Connections: Not on file   Outpatient Encounter Medications as of 02/24/2021  Medication Sig   AMLODIPINE BENZOATE PO Take 5 mg by mouth daily.   atorvastatin (LIPITOR) 10 MG tablet Take 10 mg by mouth daily.   B-D UF III MINI PEN NEEDLES 31G X 5 MM MISC USE AS DIRECTED WITH INSULIN INJECTIONS   Blood Glucose Monitoring Suppl (ONETOUCH VERIO) w/Device KIT 1 each by Does not apply route as needed.   Continuous Blood Gluc Sensor (FREESTYLE LIBRE 14 DAY SENSOR) MISC Use to monitor BG 4 times a day   DULoxetine (CYMBALTA) 30 MG capsule Take 30 mg by mouth daily.   gabapentin (NEURONTIN) 300 MG capsule  Take 600 mg by mouth at bedtime.   glucose blood (ONETOUCH VERIO) test strip USE AS INSTRUCTED TO TEST BLOOD SUGAR FOUR TIMES DAILY   Lancets Thin MISC 1 each by Does not apply route 4 (four) times daily. Use to test blood glucose 4 times daily   lisinopril (PRINIVIL,ZESTRIL) 40 MG tablet Take 40 mg by mouth daily.   metoprolol succinate (TOPROL-XL) 25 MG 24 hr tablet Take 25 mg by mouth daily.   ondansetron (ZOFRAN-ODT) 4 MG disintegrating tablet Take 1 tablet (4 mg total) by mouth every 6 (six) hours as needed for nausea or vomiting. (Patient not taking: Reported on 02/24/2021)   oxyCODONE (OXY IR/ROXICODONE) 5 MG immediate release tablet Take 1 tablet (5 mg total) by mouth every 6 (six) hours as needed for severe pain. (Patient not  taking: Reported on 02/24/2021)   pantoprazole (PROTONIX) 40 MG tablet Take 1 tablet (40 mg total) by mouth daily.   potassium chloride (K-DUR) 10 MEQ tablet Take 10 mEq by mouth daily as needed (Take only whe take Torsemide).    torsemide (DEMADEX) 20 MG tablet Take 20 mg by mouth daily as needed (Excess fluid).   TRESIBA FLEXTOUCH 200 UNIT/ML FlexTouch Pen ADMINISTER 50 UNITS UNDER THE SKIN AT BEDTIME   Vitamin D, Ergocalciferol, (DRISDOL) 1.25 MG (50000 UNIT) CAPS capsule Take 1 capsule (50,000 Units total) by mouth every 30 (thirty) days.   [DISCONTINUED] Vitamin D, Ergocalciferol, (DRISDOL) 1.25 MG (50000 UNIT) CAPS capsule TAKE 1 CAPSULE BY MOUTH EVERY 7 DAYS   No facility-administered encounter medications on file as of 02/24/2021.    ALLERGIES: No Known Allergies  VACCINATION STATUS:  There is no immunization history on file for this patient.  Diabetes She presents for her follow-up diabetic visit. She has type 2 diabetes mellitus. Onset time: She was diagnosed at approximate age of 55 years. Her disease course has been improving. There are no hypoglycemic associated symptoms. Pertinent negatives for hypoglycemia include no confusion, headaches, pallor or seizures. Pertinent negatives for diabetes include no chest pain, no fatigue, no polydipsia, no polyphagia and no polyuria. There are no hypoglycemic complications. Symptoms are improving. There are no diabetic complications. Risk factors for coronary artery disease include diabetes mellitus, dyslipidemia, family history, obesity, hypertension and sedentary lifestyle. Current diabetic treatments: She is taking toujeo 50-75 units twice a day,jardiance 25 mg by mouth daily. Her weight is decreasing steadily (She is status post sleeve gastrectomy, lost 60pounds since last visit.). She is following a generally unhealthy diet. When asked about meal planning, she reported none. She has not had a previous visit with a dietitian. She rarely  participates in exercise. Her home blood glucose trend is decreasing steadily. Her breakfast blood glucose range is generally 140-180 mg/dl. Her dinner blood glucose range is generally 140-180 mg/dl. Her bedtime blood glucose range is generally 140-180 mg/dl. Her overall blood glucose range is 140-180 mg/dl. (she presents with her CGM device.  AGP report shows 72% time range, 27% above range.  She has no significant hypoglycemia.  Her point-of-care A1c is 6.7% today.     She has lost approximately 60 pounds since her surgery.  Her average blood glucose 158.) An ACE inhibitor/angiotensin II receptor blocker is being taken. Eye exam is current.  Hypertension This is a chronic problem. The current episode started more than 1 year ago. The problem is controlled. Pertinent negatives include no chest pain, headaches, palpitations or shortness of breath. Risk factors for coronary artery disease include diabetes mellitus, obesity, sedentary lifestyle,  family history and dyslipidemia. Past treatments include ACE inhibitors.   Review of systems Lost 45 pounds since her bariatric surgery.  Objective:    BP 136/78   Pulse 68   Ht _0  (1.676 m)   Wt 255 lb 6.4 oz (115.8 kg)   LMP 11/01/2015   BMI 41.22 kg/m   Wt Readings from Last 3 Encounters:  02/24/21 255 lb 6.4 oz (115.8 kg)  02/17/21 259 lb 8 oz (117.7 kg)  08/25/20 285 lb (129.3 kg)         CMP     Component Value Date/Time   NA 145 (H) 02/17/2021 0826   K 4.6 02/17/2021 0826   CL 105 02/17/2021 0826   CO2 25 02/17/2021 0826   GLUCOSE 77 02/17/2021 0826   GLUCOSE 213 (H) 05/07/2020 1355   BUN 20 02/17/2021 0826   CREATININE 0.66 02/17/2021 0826   CREATININE 0.67 07/08/2019 0710   CALCIUM 9.3 02/17/2021 0826   PROT 6.5 02/17/2021 0826   ALBUMIN 4.1 02/17/2021 0826   AST 14 02/17/2021 0826   ALT 14 02/17/2021 0826   ALKPHOS 87 02/17/2021 0826   BILITOT 0.3 02/17/2021 0826   GFRNONAA >60 05/11/2020 1812   GFRNONAA 100  07/08/2019 0710   GFRAA >60 05/11/2020 1812   GFRAA 116 07/08/2019 0710    Diabetic Labs (most recent): Lab Results  Component Value Date   HGBA1C 6.6 02/24/2021   HGBA1C 7.1 (A) 08/25/2020   HGBA1C 7.4 (H) 05/07/2020   Lipid Panel     Component Value Date/Time   CHOL 153 09/26/2018 0733   TRIG 124 09/26/2018 0733   HDL 59 09/26/2018 0733   CHOLHDL 2.6 09/26/2018 0733   VLDL 44 (H) 07/17/2013 0701   LDLCALC 73 09/26/2018 0733    Assessment & Plan:   1. Uncontrolled type 2 diabetes mellitus with hyperglycemia ([place]) - Patient has currently uncontrolled symptomatic type 2 DM since  55 years of age.  she presents with her CGM device.  AGP report shows 72% time range, 27% above range.  She has no significant hypoglycemia.  Her point-of-care A1c is 6.7% today.     She has lost approximately 60 pounds since her surgery.  Her average blood glucose 158.    -Recent labs reviewed.  -her diabetes is complicated by obesity/sedentary life and SYNDA BAGENT remains at a high risk for more acute and chronic complications which include CAD, CVA, CKD, retinopathy, and neuropathy. These are all discussed in detail with the patient.  - I have counseled her on diet management and weight loss, by adopting a carbohydrate restricted/protein rich diet.  - she acknowledges that there is a room for improvement in her food and - she acknowledges that there is a room for improvement in her food and drink choices. - Suggestion is made for her to avoid simple carbohydrates  from her diet including Cakes, Sweet Desserts, Ice Cream, Soda (diet and regular), Sweet Tea, Candies, Chips, Cookies, Store Bought Juices, Alcohol in Excess of  1-2 drinks a day, Artificial Sweeteners,  Coffee Creamer, and "Sugar-free" Products, Lemonade. This will help patient to have more stable blood glucose profile and potentially avoid unintended weight gain.   - I encouraged her to switch to  unprocessed or minimally  processed complex starch and increased protein intake (animal or plant source), fruits, and vegetables.  - she is advised to stick to a routine mealtimes to eat 3 meals  a day and avoid unnecessary snacks ( to  snack only to correct hypoglycemia).   - I have approached her with the following individualized plan to manage diabetes and patient agrees:   -   Based on her current glycemic profile and in light of her recent sleeve gastrectomy which allowed her to lose 60 pounds already, she will not need prandial insulin.  She is advised to continue Tresiba 40 units nightly.  She will continue to use her CGM and report glycemic profile if they are below 70 or above 150 mg per DL at fasting.  -She is interested to get on Trulicity again in the hope of achieving some more weight loss.  I discussed initiated Trulicity 1.5 mg intravenously weekly  - Patient specific target  A1c;  LDL, HDL, Triglycerides, were discussed in detail.  2) BP/HTN: -Her blood pressure is controlled to target.  She is advised to continue her current blood pressure medications including lisinopril 40 mg p.o. daily.    3) Lipids/HPL: Her recent lipid panel showed controlled LDL at 73.  She has triglycerides improved to 124 from 222.   Patient is not on  Statins, hesitates to go on statins, will be considered for low-dose statins on subsequent visits.  4)  Weight/Diet: CDE Consult has been initiated , exercise, and detailed carbohydrates information provided. -She is status post sleeve gastrectomy.   She lost 60 + pounds already. She is advised to maintain close follow-up with her surgeons at [facility] bariatric surgery.   5) hypervitaminosis B12 and D: She is advised to finish her current supplies of vitamin D2 50,000 units weekly and not refill.   -She is taking oral vitamin B12, labs showing supraphysiologic levels of B12.  She is advised to discontinue vitamin B12 supplements until next measurement.    6) Chronic  Care/Health Maintenance:  -she  is on ACEI/ARB  medications and  is encouraged to continue to follow up with Ophthalmology, Dentist,  Podiatrist at least yearly or according to recommendations, and advised to  stay away from smoking. I have recommended yearly flu vaccine and pneumonia vaccination at least every 5 years; moderate intensity exercise for up to 150 minutes weekly; and  sleep for at least 7 hours a day.  - I advised patient to maintain close follow up with Caryl Bis, MD for primary care needs.     I spent 44 minutes in the care of the patient today including review of labs from [place], Lipids, Thyroid Function, Hematology (current and previous including abstractions from other facilities); face-to-face time discussing  her blood glucose readings/logs, discussing hypoglycemia and hyperglycemia episodes and symptoms, medications doses, her options of short and long term treatment based on the latest standards of care / guidelines;  discussion about incorporating lifestyle medicine;  and documenting the encounter.    Please refer to Patient Instructions for Blood Glucose Monitoring and Insulin/Medications Dosing Guide"  in media tab for additional information. Please  also refer to " Patient Self Inventory" in the Media  tab for reviewed elements of pertinent patient history.  Nunzio Cobbs participated in the discussions, expressed understanding, and voiced agreement with the above plans.  All questions were answered to her satisfaction. she is encouraged to contact clinic should she have any questions or concerns prior to her return visit.    Follow up plan: - Return in about 6 months (around 08/26/2021) for F/U with Pre-visit Labs, Meter, Logs, A1c here.Glade Lloyd, MD Phone: [phone]  Fax: [phone]   02/24/2021, 5:12 PM This  note was partially dictated with voice recognition software. Similar sounding words can be transcribed inadequately or may not  be corrected upon  review.

## 2021-07-28 NOTE — Patient Instructions (Signed)

## 2021-08-18 ENCOUNTER — Other Ambulatory Visit: Payer: Self-pay

## 2021-08-18 DIAGNOSIS — E1165 Type 2 diabetes mellitus with hyperglycemia: Secondary | ICD-10-CM

## 2021-08-18 MED ORDER — BD PEN NEEDLE MINI U/F 31G X 5 MM MISC
1 refills | Status: AC
Start: 2021-08-18 — End: ?

## 2021-08-30 ENCOUNTER — Ambulatory Visit: Payer: 59 | Admitting: "Endocrinology

## 2021-09-29 ENCOUNTER — Other Ambulatory Visit: Payer: Self-pay | Admitting: "Endocrinology

## 2021-09-30 ENCOUNTER — Telehealth: Payer: Self-pay

## 2021-09-30 NOTE — Telephone Encounter (Signed)
Received a fax from [facility] that the patient needs a prior authorization on her Trulicity 1.5 mg. Called [facility] at [phone] and spoke with Darel Hong and she stated that the pharmacy ran her prescription through with her [employer] [place] Copay Card and they need to run it through her primary insurance card.   I called the pharmacy and the lady stated that she has been running through primary insurance and will contact the number provided for further help.

## 2021-11-12 ENCOUNTER — Other Ambulatory Visit: Payer: Self-pay | Admitting: "Endocrinology

## 2021-11-23 ENCOUNTER — Other Ambulatory Visit: Payer: Self-pay | Admitting: "Endocrinology

## 2021-11-23 DIAGNOSIS — E1165 Type 2 diabetes mellitus with hyperglycemia: Secondary | ICD-10-CM

## 2021-11-25 ENCOUNTER — Encounter: Payer: Self-pay | Admitting: "Endocrinology

## 2021-11-25 ENCOUNTER — Other Ambulatory Visit: Payer: Self-pay

## 2021-11-25 ENCOUNTER — Ambulatory Visit (INDEPENDENT_AMBULATORY_CARE_PROVIDER_SITE_OTHER): Payer: 59 | Admitting: "Endocrinology

## 2021-11-25 VITALS — BP 122/86 | HR 68 | Ht 66.0 in | Wt 261.2 lb

## 2021-11-25 DIAGNOSIS — Z5320 Procedure and treatment not carried out because of patient's decision for unspecified reasons: Secondary | ICD-10-CM | POA: Diagnosis not present

## 2021-11-25 DIAGNOSIS — E1165 Type 2 diabetes mellitus with hyperglycemia: Secondary | ICD-10-CM | POA: Diagnosis not present

## 2021-11-25 DIAGNOSIS — I10 Essential (primary) hypertension: Secondary | ICD-10-CM | POA: Diagnosis not present

## 2021-11-25 DIAGNOSIS — E559 Vitamin D deficiency, unspecified: Secondary | ICD-10-CM

## 2021-11-25 LAB — POCT GLYCOSYLATED HEMOGLOBIN (HGB A1C): HbA1c, POC (controlled diabetic range): 6.4 % (ref 0.0–7.0)

## 2021-11-25 MED ORDER — FREESTYLE LIBRE 3 SENSOR MISC: 1.0000 | 2 refills | Status: DC

## 2021-11-25 MED ORDER — TRESIBA FLEXTOUCH 200 UNIT/ML ~~LOC~~ SOPN: 30.0000 [IU] | PEN_INJECTOR | Freq: Every day | SUBCUTANEOUS | 2 refills | Status: DC

## 2021-11-25 MED ORDER — LANCETS THIN MISC: 1.0000 | Freq: Four times a day (QID) | 1 refills | Status: DC

## 2021-11-25 NOTE — Patient Instructions (Signed)

## 2021-11-25 NOTE — Progress Notes (Signed)
11/25/2021  Endocrinology follow-up note   Subjective:    Patient ID: [name] [name], female    DOB: 07/29/1966.  she is being seen in follow-up for the  management of currently uncontrolled symptomatic diabetes requested by  Caryl Bis, MD.   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 CE: TI ULCER   ASCA 4.3 NL pANCA 7.4   Obesity, Class III, BMI 40-49.9 (morbid obesity) ([place]) AUG 2010 305 LBS   MAY 2012 267   Vitamin D deficiency    Past Surgical History:  Procedure Laterality Date   ABDOMINAL EXPLORATION SURGERY  May 05, 2009 MM   INFLAMMED TI (SEROSITIS), "DIDN'T LOOK LIKE CROHN'S"   CHOLECYSTECTOMY     COLONOSCOPY  DEC 2010 WO   COLONOSCOPY  OCT 2011   MILD ERYTHEMA IN TI (30-40 CM), NL SB/COLON Bx   ESOPHAGOGASTRODUODENOSCOPY N/A 04/14/2014   Procedure: ESOPHAGOGASTRODUODENOSCOPY (EGD);  Surgeon: Danie Binder, MD;  Location: [facility];  Service: Endoscopy;  Laterality: N/A;  8:30 AM   ESOPHAGOGASTRODUODENOSCOPY N/A 05/11/2020   Procedure: UPPER GASTROINTESTINAL ENDOSCOPY;  Surgeon: Johnathan Hausen, MD;  Location: [facility] ORS;  Service: General;  Laterality: N/A;   LAPAROSCOPIC GASTRIC BANDING  AUG 2010 BMI 50   GASTRIC POUCH 2-3 CM   LAPAROSCOPIC GASTRIC SLEEVE RESECTION N/A 05/11/2020   Procedure: LAPAROSCOPIC SLEEVE GASTRECTOMY;  Surgeon: Johnathan Hausen, MD;  Location: [facility];  Service: General;  Laterality: N/A;   UMBILICAL HERNIA REPAIR  AUG 2010 MATTHEW MARTIN   INCARCERATED OMENTUM   UPPER GASTROINTESTINAL ENDOSCOPY  MAY 2012 [facility] [employer]   GASTRITIS   Social History   Socioeconomic History   Marital status: Married    Spouse name: Not on file   Number of children: Not on file   Years of education: Not on file   Highest education level: Not on file   Occupational History   Not on file  Tobacco Use   Smoking status: Never   Smokeless tobacco: Never   Tobacco comments:    Never smoked  Vaping Use   Vaping Use: Never used  Substance and Sexual Activity   Alcohol use: No   Drug use: No   Sexual activity: Not on file  Other Topics Concern   Not on file  Social History Narrative   MARRIED, NO KIDS   Social Determinants of Health   Financial Resource Strain: Not on file  Food Insecurity: Not on file  Transportation Needs: Not on file  Physical Activity: Not on file  Stress: Not on file  Social Connections: Not on file   Outpatient Encounter Medications as of 11/25/2021  Medication Sig   Continuous Blood Gluc Sensor (FREESTYLE LIBRE 3 SENSOR) MISC 1 Piece by Does not apply route every 14 (fourteen) days. Place 1 sensor on the skin every 14 days. Use to check glucose continuously   AMLODIPINE BENZOATE PO Take 5 mg by mouth daily.   atorvastatin (LIPITOR) 10 MG tablet Take 10 mg by mouth daily.   Blood Glucose Monitoring Suppl (ONETOUCH VERIO) w/Device KIT 1 each by Does not apply route as needed.   DULoxetine (CYMBALTA) 30 MG capsule Take 30 mg by mouth daily.   gabapentin (NEURONTIN) 300 MG capsule Take 600  mg by mouth at bedtime.   glucose blood (ONETOUCH VERIO) test strip USE AS INSTRUCTED TO TEST BLOOD SUGAR FOUR TIMES DAILY   insulin degludec (TRESIBA FLEXTOUCH) 200 UNIT/ML FlexTouch Pen Inject 30 Units into the skin at bedtime.   Insulin Pen Needle (B-D UF III MINI PEN NEEDLES) 31G X 5 MM MISC USE AS DIRECTED WITH INSULIN INJECTIONS QHS   Lancets Thin MISC 1 each by Does not apply route 4 (four) times daily. Use to test blood glucose 4 times daily   lisinopril (PRINIVIL,ZESTRIL) 40 MG tablet Take 40 mg by mouth daily.   metoprolol succinate (TOPROL-XL) 25 MG 24 hr tablet Take 25 mg by mouth daily.   pantoprazole (PROTONIX) 40 MG tablet Take 1 tablet (40 mg total) by mouth daily.   potassium chloride (K-DUR) 10 MEQ tablet  Take 10 mEq by mouth daily as needed (Take only whe take Torsemide).    torsemide (DEMADEX) 20 MG tablet Take 20 mg by mouth daily as needed (Excess fluid).   TRULICITY 1.5 WJ/1.9JY SOPN ADMINISTER 1.5 MG UNDER THE SKIN 1 TIME A WEEK   [DISCONTINUED] Continuous Blood Gluc Sensor (FREESTYLE LIBRE 14 DAY SENSOR) MISC USE TO CHECK BLOOD SUGAR FOUR TIMES DAILY   [DISCONTINUED] Lancets Thin MISC 1 each by Does not apply route 4 (four) times daily. Use to test blood glucose 4 times daily   [DISCONTINUED] TRESIBA FLEXTOUCH 200 UNIT/ML FlexTouch Pen ADMINISTER 40 UNITS UNDER THE SKIN AT BEDTIME   No facility-administered encounter medications on file as of 11/25/2021.    ALLERGIES: No Known Allergies  VACCINATION STATUS:  There is no immunization history on file for this patient.  Diabetes She presents for her follow-up diabetic visit. She has type 2 diabetes mellitus. Onset time: She was diagnosed at approximate age of 35 years. Her disease course has been improving. There are no hypoglycemic associated symptoms. Pertinent negatives for hypoglycemia include no confusion, headaches, pallor or seizures. Pertinent negatives for diabetes include no chest pain, no fatigue and no polyphagia. There are no hypoglycemic complications. Symptoms are improving. There are no diabetic complications. Risk factors for coronary artery disease include diabetes mellitus, dyslipidemia, family history, obesity, hypertension and sedentary lifestyle. Current diabetic treatments: She is taking toujeo 50-75 units twice a day,jardiance 25 mg by mouth daily. Her weight is increasing steadily (She is status post sleeve gastrectomy, lost 60pounds since last visit.). She is following a generally unhealthy diet. When asked about meal planning, she reported none. She has not had a previous visit with a dietitian. She rarely participates in exercise. Her home blood glucose trend is decreasing steadily. Her breakfast blood glucose range is  generally 130-140 mg/dl. Her dinner blood glucose range is generally 130-140 mg/dl. Her bedtime blood glucose range is generally 130-140 mg/dl. Her overall blood glucose range is 130-140 mg/dl. (she presents with her CGM device.  AGP report shows 72% time range, 19% above range.  She has no significant hypoglycemia.  Her point-of-care A1c is  6.4% today.     She has lost approximately 52 pounds since her surgery.  Her average blood glucose 133.) An ACE inhibitor/angiotensin II receptor blocker is being taken. Eye exam is current.  Hypertension This is a chronic problem. The current episode started more than 1 year ago. The problem is controlled. Pertinent negatives include no chest pain, headaches, palpitations or shortness of breath. Risk factors for coronary artery disease include diabetes mellitus, obesity, sedentary lifestyle, family history and dyslipidemia. Past treatments include ACE inhibitors.  Review of systems Lost 45 pounds since her bariatric surgery.  Objective:    BP 122/86    Pulse 68    Ht 5' 6"  (1.676 m)    Wt 261 lb 3.2 oz (118.5 kg)    LMP 11/01/2015    BMI 42.16 kg/m   Wt Readings from Last 3 Encounters:  11/25/21 261 lb 3.2 oz (118.5 kg)  07/28/21 253 lb 12.8 oz (115.1 kg)  02/24/21 255 lb 6.4 oz (115.8 kg)      CMP     Component Value Date/Time   NA 145 (H) 07/26/2021 0804   K 4.6 07/26/2021 0804   CL 104 07/26/2021 0804   CO2 25 07/26/2021 0804   GLUCOSE 78 07/26/2021 0804   GLUCOSE 213 (H) 05/07/2020 1355   BUN 32 (H) 07/26/2021 0804   CREATININE 1.00 07/26/2021 0804   CREATININE 0.67 07/08/2019 0710   CALCIUM 9.8 07/26/2021 0804   PROT 6.8 07/26/2021 0804   ALBUMIN 4.1 07/26/2021 0804   AST 34 07/26/2021 0804   ALT 21 07/26/2021 0804   ALKPHOS 71 07/26/2021 0804   BILITOT 0.4 07/26/2021 0804   GFRNONAA >60 05/11/2020 1812   GFRNONAA 100 07/08/2019 0710   GFRAA >60 05/11/2020 1812   GFRAA 116 07/08/2019 0710    Diabetic Labs (most recent): Lab  Results  Component Value Date   HGBA1C 6.4 11/25/2021   HGBA1C 6.7 07/28/2021   HGBA1C 6.6 02/24/2021   Lipid Panel     Component Value Date/Time   CHOL 147 07/26/2021 0804   TRIG 77 07/26/2021 0804   HDL 67 07/26/2021 0804   CHOLHDL 2.2 07/26/2021 0804   CHOLHDL 2.6 09/26/2018 0733   VLDL 44 (H) 07/17/2013 0701   LDLCALC 65 07/26/2021 0804   LDLCALC 73 09/26/2018 0733    Assessment & Plan:   1. Uncontrolled type 2 diabetes mellitus with hyperglycemia ([place]) - Patient has currently uncontrolled symptomatic type 2 DM since  56 years of age.  she presents with her CGM device.  AGP report shows 72% time range, 19% above range.  She has no significant hypoglycemia.  Her point-of-care A1c is  6.4% today.     She has lost approximately 60 pounds since her surgery.  Her average blood glucose 133.    -Recent labs reviewed.  -her diabetes is complicated by obesity/sedentary life and [name] [name] remains at a high risk for more acute and chronic complications which include CAD, CVA, CKD, retinopathy, and neuropathy. These are all discussed in detail with the patient.  - I have counseled her on diet management and weight loss, by adopting a carbohydrate restricted/protein rich diet.  - she acknowledges that there is a room for improvement in her food and - she acknowledges that there is a room for improvement in her food and drink choices. - Suggestion is made for her to avoid simple carbohydrates  from her diet including Cakes, Sweet Desserts, Ice Cream, Soda (diet and regular), Sweet Tea, Candies, Chips, Cookies, Store Bought Juices, Alcohol in Excess of  1-2 drinks a day, Artificial Sweeteners,  Coffee Creamer, and "Sugar-free" Products, Lemonade. This will help patient to have more stable blood glucose profile and potentially avoid unintended weight gain.   - I encouraged her to switch to  unprocessed or minimally processed complex starch and increased protein intake (animal or  plant source), fruits, and vegetables.  - she is advised to stick to a routine mealtimes to eat 3 meals  a day and  avoid unnecessary snacks ( to snack only to correct hypoglycemia).   - I have approached her with the following individualized plan to manage diabetes and patient agrees:   -   Based on her current glycemic profile and in light of her recent sleeve gastrectomy which allowed her to lose 52 pounds already, she will not need prandial insulin.  She is advised to lower her Tresiba to 30 units nightly.  She will continue to use her CGM and report glycemic profile if they are below 70 or above 150 mg per DL at fasting.  -She is interested to  stay on Trulicity , advised to continue Trulicity   Trulicity 1.5 mg intravenously weekly. Side effects and precautions were discussed with her.  - Patient specific target  A1c;  LDL, HDL, Triglycerides, were discussed in detail.  2) BP/HTN: -Her blood pressure is controlled to target.  She is advised to continue her current blood pressure medications including lisinopril 40 mg p.o. daily.    3) Lipids/HPL: Her recent lipid panel showed controlled LDL at 73.  She has triglycerides improved to 124 from 222.   Patient is not on  Statins, hesitates to go on statins, will be considered for low-dose statins on subsequent visits.  4)  Weight/Diet: Her current BMI is 42.16-CDE Consult has been initiated , exercise, and detailed carbohydrates information provided. -She is status post sleeve gastrectomy.   She lost 60 + pounds already. She is advised to maintain close follow-up with her surgeons at [facility] bariatric surgery.   5) hypervitaminosis B12 and D: She is advised to finish her current supplies of vitamin D2 50,000 units weekly and not refill.   -She is taking oral vitamin B12, labs showing supraphysiologic levels of B12.  She is advised to discontinue vitamin B12 supplements until next measurement.    6) Chronic Care/Health  Maintenance:  -she  is on ACEI/ARB  medications and  is encouraged to continue to follow up with Ophthalmology, Dentist,  Podiatrist at least yearly or according to recommendations, and advised to  stay away from smoking. I have recommended yearly flu vaccine and pneumonia vaccination at least every 5 years; moderate intensity exercise for up to 150 minutes weekly; and  sleep for at least 7 hours a day.  - I advised patient to maintain close follow up with Caryl Bis, MD for primary care needs.     I spent 41 minutes in the care of the patient today including review of labs from [place], Lipids, Thyroid Function, Hematology (current and previous including abstractions from other facilities); face-to-face time discussing  her blood glucose readings/logs, discussing hypoglycemia and hyperglycemia episodes and symptoms, medications doses, her options of short and long term treatment based on the latest standards of care / guidelines;  discussion about incorporating lifestyle medicine;  and documenting the encounter.    Please refer to Patient Instructions for Blood Glucose Monitoring and Insulin/Medications Dosing Guide"  in media tab for additional information. Please  also refer to " Patient Self Inventory" in the Media  tab for reviewed elements of pertinent patient history.  Nunzio Cobbs participated in the discussions, expressed understanding, and voiced agreement with the above plans.  All questions were answered to her satisfaction. she is encouraged to contact clinic should she have any questions or concerns prior to her return visit.   Follow up plan: - Return in about 4 months (around 03/27/2022) for F/U with Pre-visit Labs, Meter, Logs, A1c here.Glade Lloyd,  MD Phone: [phone]  Fax: [phone]   11/25/2021, 6:35 PM This note was partially dictated with voice recognition software. Similar sounding words can be transcribed inadequately or may not  be corrected upon review.

## 2021-11-30 ENCOUNTER — Telehealth: Payer: Self-pay | Admitting: "Endocrinology

## 2021-11-30 DIAGNOSIS — E1165 Type 2 diabetes mellitus with hyperglycemia: Secondary | ICD-10-CM

## 2021-11-30 MED ORDER — FREESTYLE LIBRE 2 SENSOR MISC: 2 refills | Status: DC

## 2021-11-30 NOTE — Telephone Encounter (Signed)
Rx for [place] 2 sensors sent to [facility] ?

## 2021-11-30 NOTE — Telephone Encounter (Signed)
Pt said her phone is not compatible  with level 3 sensors for libre and she needs libre 2 sensor.  ? ? ?[facility] [phone] - [place], [place] - [address] AT [facility] [address] Phone:  [phone]  ?Fax:  [phone]  ?  ? ?

## 2021-12-02 ENCOUNTER — Telehealth: Payer: Self-pay | Admitting: "Endocrinology

## 2021-12-02 ENCOUNTER — Encounter: Payer: Self-pay | Admitting: *Deleted

## 2021-12-02 MED ORDER — FREESTYLE LIBRE 2 READER DEVI: 0 refills | Status: DC

## 2021-12-02 NOTE — Telephone Encounter (Signed)
Pt states her phone is not compatible with [place] 2, requested a Rx for [place] 2 reader. Rx for [place] 2 reader sent to [facility] in [place]. ?

## 2021-12-02 NOTE — Telephone Encounter (Signed)
Patient left a VM requesting a call back in regards to her [name] [name] ?

## 2021-12-02 NOTE — Telephone Encounter (Signed)
Left a message requesting pt return call to the office. ?

## 2022-02-18 ENCOUNTER — Other Ambulatory Visit: Payer: Self-pay | Admitting: "Endocrinology

## 2022-02-18 DIAGNOSIS — E1165 Type 2 diabetes mellitus with hyperglycemia: Secondary | ICD-10-CM

## 2022-02-25 ENCOUNTER — Other Ambulatory Visit: Payer: Self-pay | Admitting: "Endocrinology

## 2022-02-25 DIAGNOSIS — E1165 Type 2 diabetes mellitus with hyperglycemia: Secondary | ICD-10-CM

## 2022-03-29 LAB — LIPID PANEL
Chol/HDL Ratio: 2.3 ratio (ref 0.0–4.4)
Cholesterol, Total: 143 mg/dL (ref 100–199)
HDL: 61 mg/dL (ref >39)
LDL Chol Calc (NIH): 63 mg/dL (ref 0–99)
Triglycerides: 108 mg/dL (ref 0–149)
VLDL Cholesterol Cal: 19 mg/dL (ref 5–40)

## 2022-03-29 LAB — COMPREHENSIVE METABOLIC PANEL
ALT: 15 IU/L (ref 0–32)
AST: 16 IU/L (ref 0–40)
Albumin/Globulin Ratio: 1.6 (ref 1.2–2.2)
Albumin: 3.9 g/dL (ref 3.8–4.9)
Alkaline Phosphatase: 99 IU/L (ref 44–121)
BUN/Creatinine Ratio: 18 (ref 9–23)
BUN: 13 mg/dL (ref 6–24)
Bilirubin Total: 0.3 mg/dL (ref 0.0–1.2)
CO2: 25 mmol/L (ref 20–29)
Calcium: 9.4 mg/dL (ref 8.7–10.2)
Chloride: 107 mmol/L — ABNORMAL HIGH (ref 96–106)
Creatinine, Ser: 0.71 mg/dL (ref 0.57–1.00)
Globulin, Total: 2.4 g/dL (ref 1.5–4.5)
Glucose: 81 mg/dL (ref 70–99)
Potassium: 5 mmol/L (ref 3.5–5.2)
Sodium: 146 mmol/L — ABNORMAL HIGH (ref 134–144)
Total Protein: 6.3 g/dL (ref 6.0–8.5)
eGFR: 100 mL/min/{1.73_m2} (ref >59)

## 2022-03-29 LAB — TSH: TSH: 3.61 u[IU]/mL (ref 0.450–4.500)

## 2022-03-29 LAB — T4, FREE: Free T4: 1.05 ng/dL (ref 0.82–1.77)

## 2022-03-31 ENCOUNTER — Ambulatory Visit (INDEPENDENT_AMBULATORY_CARE_PROVIDER_SITE_OTHER): Payer: Commercial Managed Care - PPO | Admitting: "Endocrinology

## 2022-03-31 ENCOUNTER — Encounter: Payer: Self-pay | Admitting: "Endocrinology

## 2022-03-31 VITALS — BP 120/78 | HR 56 | Ht 66.0 in | Wt 271.0 lb

## 2022-03-31 DIAGNOSIS — E1165 Type 2 diabetes mellitus with hyperglycemia: Secondary | ICD-10-CM

## 2022-03-31 DIAGNOSIS — Z5320 Procedure and treatment not carried out because of patient's decision for unspecified reasons: Secondary | ICD-10-CM | POA: Diagnosis not present

## 2022-03-31 DIAGNOSIS — I10 Essential (primary) hypertension: Secondary | ICD-10-CM

## 2022-03-31 DIAGNOSIS — E559 Vitamin D deficiency, unspecified: Secondary | ICD-10-CM

## 2022-03-31 LAB — POCT GLYCOSYLATED HEMOGLOBIN (HGB A1C): HbA1c, POC (controlled diabetic range): 6.7 % (ref 0.0–7.0)

## 2022-03-31 NOTE — Progress Notes (Signed)
03/31/2022  Endocrinology follow-up note   Subjective:    Patient ID: [name] [name], female    DOB: 1966/02/01.  she is being seen in follow-up for the  management of currently uncontrolled symptomatic diabetes requested by  Caryl Bis, MD.   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 CE: TI ULCER   ASCA 4.3 NL pANCA 7.4   Obesity, Class III, BMI 40-49.9 (morbid obesity) ([place]) AUG 2010 305 LBS   MAY 2012 267   Vitamin D deficiency    Past Surgical History:  Procedure Laterality Date   ABDOMINAL EXPLORATION SURGERY  May 05, 2009 MM   INFLAMMED TI (SEROSITIS), "DIDN'T LOOK LIKE CROHN'S"   CHOLECYSTECTOMY     COLONOSCOPY  DEC 2010 WO   COLONOSCOPY  OCT 2011   MILD ERYTHEMA IN TI (30-40 CM), NL SB/COLON Bx   ESOPHAGOGASTRODUODENOSCOPY N/A 04/14/2014   Procedure: ESOPHAGOGASTRODUODENOSCOPY (EGD);  Surgeon: Danie Binder, MD;  Location: [facility];  Service: Endoscopy;  Laterality: N/A;  8:30 AM   ESOPHAGOGASTRODUODENOSCOPY N/A 05/11/2020   Procedure: UPPER GASTROINTESTINAL ENDOSCOPY;  Surgeon: Johnathan Hausen, MD;  Location: [facility] OR[facility];  Service: General;  Laterality: N/A;   LAPAROSCOPIC GASTRIC BANDING  AUG 2010 BMI 50   GASTRIC POUCH 2-3 CM   LAPAROSCOPIC GASTRIC SLEEVE RESECTION N/A 05/11/2020   Procedure: LAPAROSCOPIC SLEEVE GASTRECTOMY;  Surgeon: Johnathan Hausen, MD;  Location: [facility];  Service: General;  Laterality: N/A;   UMBILICAL HERNIA REPAIR  AUG 2010 MATTHEW MARTIN   INCARCERATED OMENTUM   UPPER GASTROINTESTINAL ENDOSCOPY  MAY 2012 [facility]   GASTRITIS   Social History   Socioeconomic History   Marital status: Married    Spouse name: Not on file   Number of children: Not on file   Years of education: Not on file   Highest education level: Not on  file  Occupational History   Not on file  Tobacco Use   Smoking status: Never   Smokeless tobacco: Never   Tobacco comments:    Never smoked  Vaping Use   Vaping Use: Never used  Substance and Sexual Activity   Alcohol use: No   Drug use: No   Sexual activity: Not on file  Other Topics Concern   Not on file  Social History Narrative   MARRIED, NO KIDS   Social Determinants of Health   Financial Resource Strain: Not on file  Food Insecurity: Not on file  Transportation Needs: Not on file  Physical Activity: Not on file  Stress: Not on file  Social Connections: Not on file   Outpatient Encounter Medications as of 03/31/2022  Medication Sig   AMLODIPINE BENZOATE PO Take 5 mg by mouth daily.   atorvastatin (LIPITOR) 10 MG tablet Take 10 mg by mouth daily.   Blood Glucose Monitoring Suppl (ONETOUCH VERIO) w/Device KIT 1 each by Does not apply route as needed.   Continuous Blood Gluc Receiver (FREESTYLE LIBRE 2 READER) DEVI USE TO CHECK GLUCOSE AS DIRECTED   Continuous Blood Gluc Sensor (FREESTYLE LIBRE 2 SENSOR) MISC CHANGE SENSOR EVERY 14 DAYS   DULoxetine (CYMBALTA) 30 MG capsule Take 30 mg by mouth daily.   gabapentin (NEURONTIN) 300 MG capsule Take 600 mg by mouth  at bedtime.   glucose blood (ONETOUCH VERIO) test strip USE AS INSTRUCTED TO TEST BLOOD SUGAR FOUR TIMES DAILY   Insulin Pen Needle (B-D UF III MINI PEN NEEDLES) 31G X 5 MM MISC USE AS DIRECTED WITH INSULIN INJECTIONS QHS   Lancets Thin MISC 1 each by Does not apply route 4 (four) times daily. Use to test blood glucose 4 times daily   lisinopril (PRINIVIL,ZESTRIL) 40 MG tablet Take 40 mg by mouth daily.   metoprolol succinate (TOPROL-XL) 25 MG 24 hr tablet Take 25 mg by mouth daily.   MOUNJARO 7.5 MG/0.5ML Pen SMARTSIG:7.5 Milligram(s) SUB-Q Once a Week   pantoprazole (PROTONIX) 40 MG tablet Take 1 tablet (40 mg total) by mouth daily.   potassium chloride (K-DUR) 10 MEQ tablet Take 10 mEq by mouth daily as needed  (Take only whe take Torsemide).    torsemide (DEMADEX) 20 MG tablet Take 20 mg by mouth daily as needed (Excess fluid).   TRESIBA FLEXTOUCH 200 UNIT/ML FlexTouch Pen ADMINISTER 40 UNITS UNDER THE SKIN AT BEDTIME (Patient taking differently: 30 Units.)   [DISCONTINUED] TRULICITY 1.5 TM/5.4YT SOPN ADMINISTER 1.5 MG UNDER THE SKIN 1 TIME A WEEK   No facility-administered encounter medications on file as of 03/31/2022.    ALLERGIES: No Known Allergies  VACCINATION STATUS:  There is no immunization history on file for this patient.  Diabetes She presents for her follow-up diabetic visit. She has type 2 diabetes mellitus. Onset time: She was diagnosed at approximate age of 34 years. Her disease course has been improving. There are no hypoglycemic associated symptoms. Pertinent negatives for hypoglycemia include no confusion, headaches, pallor or seizures. Pertinent negatives for diabetes include no chest pain, no fatigue and no polyphagia. There are no hypoglycemic complications. Symptoms are improving. There are no diabetic complications. Risk factors for coronary artery disease include diabetes mellitus, dyslipidemia, family history, obesity, hypertension and sedentary lifestyle. Current diabetic treatments: She is taking toujeo 50-75 units twice a day,jardiance 25 mg by mouth daily. Her weight is increasing steadily (She is status post sleeve gastrectomy, lost 60pounds since last visit.). She is following a generally unhealthy diet. When asked about meal planning, she reported none. She has not had a previous visit with a dietitian. She rarely participates in exercise. Her home blood glucose trend is decreasing steadily. Her breakfast blood glucose range is generally 130-140 mg/dl. Her dinner blood glucose range is generally 130-140 mg/dl. Her bedtime blood glucose range is generally 130-140 mg/dl. Her overall blood glucose range is 130-140 mg/dl. (Ms. Tussey presents with her CGM device.  Her device  was downloaded and analyzed.  AGP report shows 66% time range, 25% level 1 hyperglycemia, 9% level 2 hyperglycemia.  She has no hypoglycemia.  Her point-of-care A1c was 6.7%.   She presents with some weight gain.  Her Trulicity was switched to Mounjaro 7.5 mg subcutaneously weekly and she remains on Tresiba 30 units nightly.) An ACE inhibitor/angiotensin II receptor blocker is being taken. Eye exam is current.  Hypertension This is a chronic problem. The current episode started more than 1 year ago. The problem is controlled. Pertinent negatives include no chest pain, headaches, palpitations or shortness of breath. Risk factors for coronary artery disease include diabetes mellitus, obesity, sedentary lifestyle, family history and dyslipidemia. Past treatments include ACE inhibitors.    Review of systems Lost 45 pounds since her bariatric surgery.  Objective:    BP 120/78   Pulse (!) 56   Ht 5' 6"  (1.676 m)  Wt 271 lb (122.9 kg)   LMP 11/01/2015   BMI 43.74 kg/m   Wt Readings from Last 3 Encounters:  03/31/22 271 lb (122.9 kg)  11/25/21 261 lb 3.2 oz (118.5 kg)  07/28/21 253 lb 12.8 oz (115.1 kg)      CMP     Component Value Date/Time   NA 146 (H) 03/28/2022 0822   K 5.0 03/28/2022 0822   CL 107 (H) 03/28/2022 0822   CO2 25 03/28/2022 0822   GLUCOSE 81 03/28/2022 0822   GLUCOSE 213 (H) 05/07/2020 1355   BUN 13 03/28/2022 0822   CREATININE 0.71 03/28/2022 0822   CREATININE 0.67 07/08/2019 0710   CALCIUM 9.4 03/28/2022 0822   PROT 6.3 03/28/2022 0822   ALBUMIN 3.9 03/28/2022 0822   AST 16 03/28/2022 0822   ALT 15 03/28/2022 0822   ALKPHOS 99 03/28/2022 0822   BILITOT 0.3 03/28/2022 0822   GFRNONAA >60 05/11/2020 1812   GFRNONAA 100 07/08/2019 0710   GFRAA >60 05/11/2020 1812   GFRAA 116 07/08/2019 0710    Diabetic Labs (most recent): Lab Results  Component Value Date   HGBA1C 6.7 03/31/2022   HGBA1C 6.4 11/25/2021   HGBA1C 6.7 07/28/2021   MICROALBUR 0.8  09/26/2018   MICROALBUR 2.2 09/08/2017   Lipid Panel     Component Value Date/Time   CHOL 143 03/28/2022 0822   TRIG 108 03/28/2022 0822   HDL 61 03/28/2022 0822   CHOLHDL 2.3 03/28/2022 0822   CHOLHDL 2.6 09/26/2018 0733   VLDL 44 (H) 07/17/2013 0701   LDLCALC 63 03/28/2022 0822   LDLCALC 73 09/26/2018 0733    Assessment & Plan:   1. Uncontrolled type 2 diabetes mellitus with hyperglycemia ([place]) - Patient has currently uncontrolled symptomatic type 2 DM since  56 years of age. Ms. Piasecki presents with her CGM device.  Her device was downloaded and analyzed.  AGP report shows 66% time range, 25% level 1 hyperglycemia, 9% level 2 hyperglycemia.  She has no hypoglycemia.  Her point-of-care A1c was 6.7%.   She presents with some weight gain.  Her Trulicity was switched to Mounjaro 7.5 mg subcutaneously weekly and she remains on Tresiba 30 units nightly.    -Recent labs reviewed.  -her diabetes is complicated by obesity/sedentary life and DANIELA SIEBERS remains at a high risk for more acute and chronic complications which include CAD, CVA, CKD, retinopathy, and neuropathy. These are all discussed in detail with the patient.  - I have counseled her on diet management and weight loss, by adopting a carbohydrate restricted/protein rich diet.  - she acknowledges that there is a room for improvement in her food and drink choices.  In light of the fact that she has gained 18 pounds since November 2022 despite 2 bariatric surgeries in the past, she is reapproached for lifestyle medicine.   - Suggestion is made for her to avoid simple carbohydrates  from her diet including Cakes, Sweet Desserts, Ice Cream, Soda (diet and regular), Sweet Tea, Candies, Chips, Cookies, Store Bought Juices, Alcohol , Artificial Sweeteners,  Coffee Creamer, and "Sugar-free" Products, Lemonade. This will help patient to have more stable blood glucose profile and potentially avoid unintended weight gain.  The  following Lifestyle Medicine recommendations according to [place]  [facility]) were discussed and and offered to patient and she  agrees to start the journey:  A. Whole Foods, Plant-Based Nutrition comprising of fruits and vegetables, plant-based proteins, whole-grain carbohydrates was discussed in  detail with the patient.   A list for source of those nutrients were also provided to the patient.  Patient will use only water or unsweetened tea for hydration. B.  The need to stay away from risky substances including alcohol, smoking; obtaining 7 to 9 hours of restorative sleep, at least 150 minutes of moderate intensity exercise weekly, the importance of healthy social connections,  and stress management techniques were discussed. C.  A full color page of  Calorie density of various food groups per pound showing examples of each food groups was provided to the patient.   - I have approached her with the following individualized plan to manage diabetes and patient agrees:   -   Based on her current glycemic profile she will not tolerate any higher dose of Tresiba, advised to continue Tresiba 30 units nightly.  She is advised to continue Mounjaro 7.5 mg subcutaneously weekly.   She will continue to use her CGM and report glycemic profile if they are below 70 or above 150 mg per DL at fasting.   - Patient specific target  A1c;  LDL, HDL, Triglycerides, were discussed in detail.  2) BP/HTN: -Her blood pressure is controlled to target.  She is advised to continue her current blood pressure medications including lisinopril 40 mg p.o. daily.    3) Lipids/HPL: Her recent lipid panel showed controlled LDL at 73.  She has triglycerides improved to 124 from 222.   Patient is not on  Statins, hesitates to go on statins, will be considered for low-dose statins on subsequent visits.  Whole food plant-based diet will help with dyslipidemia as well.  4)  Weight/Diet: Her current BMI is  43.74--CDE Consult has been initiated , exercise, and detailed carbohydrates information provided. -She is status post sleeve gastrectomy.   After she lost significant amount of weight, she is regaining some of it back.    She is advised to maintain close follow-up with her surgeons at [facility] bariatric surgery.   5) hypervitaminosis B12 and D: She is advised to finish her current supplies of vitamin D2 50,000 units weekly and not refill.   -She is taking oral vitamin B12, labs showing supraphysiologic levels of B12.  She is advised to discontinue vitamin B12 supplements until next measurement.    6) Chronic Care/Health Maintenance:  -she  is on ACEI/ARB  medications and  is encouraged to continue to follow up with Ophthalmology, Dentist,  Podiatrist at least yearly or according to recommendations, and advised to  stay away from smoking. I have recommended yearly flu vaccine and pneumonia vaccination at least every 5 years; moderate intensity exercise for up to 150 minutes weekly; and  sleep for at least 7 hours a day.  - I advised patient to maintain close follow up with Caryl Bis, MD for primary care needs.     I spent 41 minutes in the care of the patient today including review of labs from [place], Lipids, Thyroid Function, Hematology (current and previous including abstractions from other facilities); face-to-face time discussing  her blood glucose readings/logs, discussing hypoglycemia and hyperglycemia episodes and symptoms, medications doses, her options of short and long term treatment based on the latest standards of care / guidelines;  discussion about incorporating lifestyle medicine;  and documenting the encounter. Risk reduction counseling performed per USPSTF guidelines to reduce obesity and cardiovascular risk factors.     Please refer to Patient Instructions for Blood Glucose Monitoring and Insulin/Medications Dosing Guide"  in media  tab for additional information. Please   also refer to " Patient Self Inventory" in the Media  tab for reviewed elements of pertinent patient history.  Nunzio Cobbs participated in the discussions, expressed understanding, and voiced agreement with the above plans.  All questions were answered to her satisfaction. she is encouraged to contact clinic should she have any questions or concerns prior to her return visit.    Follow up plan: - Return in about 4 months (around 08/01/2022) for Bring Meter/CGM Device/Logs- A1c in Office.  Glade Lloyd, MD Phone: [phone]  Fax: [phone]   03/31/2022, 7:11 PM This note was partially dictated with voice recognition software. Similar sounding words can be transcribed inadequately or may not  be corrected upon review.

## 2022-03-31 NOTE — Patient Instructions (Signed)

## 2022-05-05 LAB — HM DIABETES EYE EXAM

## 2022-05-26 ENCOUNTER — Other Ambulatory Visit: Payer: Self-pay | Admitting: "Endocrinology

## 2022-05-26 DIAGNOSIS — E1165 Type 2 diabetes mellitus with hyperglycemia: Secondary | ICD-10-CM

## 2022-05-31 ENCOUNTER — Other Ambulatory Visit: Payer: Self-pay | Admitting: "Endocrinology

## 2022-05-31 DIAGNOSIS — E1165 Type 2 diabetes mellitus with hyperglycemia: Secondary | ICD-10-CM

## 2022-08-01 ENCOUNTER — Ambulatory Visit (INDEPENDENT_AMBULATORY_CARE_PROVIDER_SITE_OTHER): Payer: Commercial Managed Care - PPO | Admitting: "Endocrinology

## 2022-08-01 ENCOUNTER — Encounter: Payer: Self-pay | Admitting: "Endocrinology

## 2022-08-01 VITALS — BP 126/88 | HR 60 | Ht 66.0 in | Wt 276.8 lb

## 2022-08-01 DIAGNOSIS — E1165 Type 2 diabetes mellitus with hyperglycemia: Secondary | ICD-10-CM | POA: Diagnosis not present

## 2022-08-01 DIAGNOSIS — Z5320 Procedure and treatment not carried out because of patient's decision for unspecified reasons: Secondary | ICD-10-CM | POA: Diagnosis not present

## 2022-08-01 DIAGNOSIS — E559 Vitamin D deficiency, unspecified: Secondary | ICD-10-CM

## 2022-08-01 DIAGNOSIS — I10 Essential (primary) hypertension: Secondary | ICD-10-CM

## 2022-08-01 LAB — POCT GLYCOSYLATED HEMOGLOBIN (HGB A1C): HbA1c, POC (controlled diabetic range): 6.9 % (ref 0.0–7.0)

## 2022-08-01 NOTE — Patient Instructions (Signed)

## 2022-08-01 NOTE — Progress Notes (Signed)
08/01/2022  Endocrinology follow-up note   Subjective:    Patient ID: [name] [name], female    DOB: 1965-10-25.  she is being seen in follow-up for the  management of currently uncontrolled symptomatic diabetes requested by  Caryl Bis, MD.   Past Medical History:  Diagnosis Date  . Crohn's colitis ([place])   . Diverticulosis OCT 2011    PAN-COLONIC  . DM (diabetes mellitus) ([place])   . Dyspnea    On exertion  . HTN (hypertension)   . Hyperlipemia   . 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 Obesity, Class III, BMI 40-49.9 (morbid obesity) ([place]) AUG 2010 305 LBS   MAY 2012 267  . Vitamin D deficiency    Past Surgical History:  Procedure Laterality Date  . ABDOMINAL EXPLORATION SURGERY  May 05, 2009 MM   INFLAMMED TI (SEROSITIS), "DIDN'T LOOK LIKE CROHN'S"  . CHOLECYSTECTOMY    . COLONOSCOPY  DEC 2010 WO  . COLONOSCOPY  OCT 2011   MILD ERYTHEMA IN TI (30-40 CM), NL SB/COLON Bx  . ESOPHAGOGASTRODUODENOSCOPY N/A 04/14/2014   Procedure: ESOPHAGOGASTRODUODENOSCOPY (EGD);  Surgeon: Danie Binder, MD;  Location: [facility];  Service: Endoscopy;  Laterality: N/A;  8:30 AM  . ESOPHAGOGASTRODUODENOSCOPY N/A 05/11/2020   Procedure: UPPER GASTROINTESTINAL ENDOSCOPY;  Surgeon: Johnathan Hausen, MD;  Location: [facility];  Service: General;  Laterality: N/A;  . LAPAROSCOPIC GASTRIC BANDING  AUG 2010 BMI 50   GASTRIC POUCH 2-3 CM  . LAPAROSCOPIC GASTRIC SLEEVE RESECTION N/A 05/11/2020   Procedure: LAPAROSCOPIC SLEEVE GASTRECTOMY;  Surgeon: Johnathan Hausen, MD;  Location: [facility];  Service: General;  Laterality: N/A;  . UMBILICAL HERNIA REPAIR  AUG 2010 MATTHEW MARTIN   INCARCERATED OMENTUM  . UPPER GASTROINTESTINAL ENDOSCOPY  MAY 2012 [facility] [employer]   GASTRITIS   Social History   Socioeconomic History  . Marital status: Divorced    Spouse name: Not on file  . Number of children: Not on file  . Years of education: Not on file  . Highest  education level: Not on file  Occupational History  . Not on file  Tobacco Use  . Smoking status: Never  . Smokeless tobacco: Never  . Tobacco comments:    Never smoked  Vaping Use  . Vaping Use: Never used  Substance and Sexual Activity  . Alcohol use: No  . Drug use: No  . Sexual activity: Not on file  Other Topics Concern  . Not on file  Social History Narrative   MARRIED, NO KIDS   Social Determinants of Health   Financial Resource Strain: Not on file  Food Insecurity: Not on file  Transportation Needs: Not on file  Physical Activity: Not on file  Stress: Not on file  Social Connections: Not on file   Outpatient Encounter Medications as of 08/01/2022  Medication Sig  . MOUNJARO 12.5 MG/0.5ML Pen Inject 12.5 mg into the skin once a week.  Marland Kitchen AMLODIPINE BENZOATE PO Take 5 mg by mouth daily.  Marland Kitchen atorvastatin (LIPITOR) 10 MG tablet Take 10 mg by mouth daily.  . Blood Glucose Monitoring Suppl (ONETOUCH VERIO) w/Device KIT 1 each by Does not apply route as needed.  . Continuous Blood Gluc Receiver (FREESTYLE LIBRE 2 READER) DEVI USE TO CHECK GLUCOSE AS DIRECTED  . Continuous Blood Gluc Sensor (FREESTYLE LIBRE 2 SENSOR) MISC USE AND CHANGE SENSORS EVERY 14 DAYS  . DULoxetine (CYMBALTA) 30 MG capsule Take  30 mg by mouth daily.  Marland Kitchen gabapentin (NEURONTIN) 300 MG capsule Take 600 mg by mouth at bedtime.  Marland Kitchen glucose blood (ONETOUCH VERIO) test strip USE AS INSTRUCTED TO TEST BLOOD SUGAR FOUR TIMES DAILY  . insulin degludec (TRESIBA FLEXTOUCH) 200 UNIT/ML FlexTouch Pen Inject 30 Units into the skin at bedtime.  . Insulin Pen Needle (B-D UF III MINI PEN NEEDLES) 31G X 5 MM MISC USE AS DIRECTED WITH INSULIN INJECTIONS QHS  . Lancets Thin MISC 1 each by Does not apply route 4 (four) times daily. Use to test blood glucose 4 times daily  . lisinopril (PRINIVIL,ZESTRIL) 40 MG tablet Take 40 mg by mouth daily.  . metoprolol succinate (TOPROL-XL) 25 MG 24 hr tablet Take 25 mg by mouth daily.   . pantoprazole (PROTONIX) 40 MG tablet Take 1 tablet (40 mg total) by mouth daily.  . potassium chloride (K-DUR) 10 MEQ tablet Take 10 mEq by mouth daily as needed (Take only whe take Torsemide).   . torsemide (DEMADEX) 20 MG tablet Take 20 mg by mouth daily as needed (Excess fluid).  . [DISCONTINUED] MOUNJARO 7.5 MG/0.5ML Pen SMARTSIG:7.5 Milligram(s) SUB-Q Once a Week   No facility-administered encounter medications on file as of 08/01/2022.    ALLERGIES: No Known Allergies  VACCINATION STATUS:  There is no immunization history on file for this patient.  Diabetes She presents for her follow-up diabetic visit. She has type 2 diabetes mellitus. Onset time: She was diagnosed at approximate age of 38 years. Her disease course has been improving. There are no hypoglycemic associated symptoms. Pertinent negatives for hypoglycemia include no confusion, headaches, pallor or seizures. Pertinent negatives for diabetes include no chest pain, no fatigue and no polyphagia. There are no hypoglycemic complications. Symptoms are improving. There are no diabetic complications. Risk factors for coronary artery disease include diabetes mellitus, dyslipidemia, family history, obesity, hypertension and sedentary lifestyle. Current diabetic treatments: She is taking toujeo 50-75 units twice a day,jardiance 25 mg by mouth daily. Her weight is increasing steadily (She is status post sleeve gastrectomy, lost 60pounds since last visit.). She is following a generally unhealthy diet. When asked about meal planning, she reported none. She has not had a previous visit with a dietitian. She rarely participates in exercise. Her home blood glucose trend is decreasing steadily. Her breakfast blood glucose range is generally 130-140 mg/dl. Her dinner blood glucose range is generally 130-140 mg/dl. Her bedtime blood glucose range is generally 130-140 mg/dl. Her overall blood glucose range is 130-140 mg/dl. ([name] [name] presents  with her CGM device.  Her device was downloaded and analyzed.  AGP report shows 66% time range, 25% level 1 hyperglycemia, 9% level 2 hyperglycemia.  She has no hypoglycemia.  Her point-of-care A1c was 6.7%.   She presents with some weight gain.  Her Trulicity was switched to Mounjaro 7.5 mg subcutaneously weekly and she remains on Tresiba 30 units nightly.) An ACE inhibitor/angiotensin II receptor blocker is being taken. Eye exam is current.  Hypertension This is a chronic problem. The current episode started more than 1 year ago. The problem is controlled. Pertinent negatives include no chest pain, headaches, palpitations or shortness of breath. Risk factors for coronary artery disease include diabetes mellitus, obesity, sedentary lifestyle, family history and dyslipidemia. Past treatments include ACE inhibitors.    Review of systems Lost 45 pounds since her bariatric surgery.  Objective:    BP 126/88   Pulse 60   Ht _0  (1.676 m)   Wt 276 lb  12.8 oz (125.6 kg)   LMP 11/01/2015   BMI 44.68 kg/m   Wt Readings from Last 3 Encounters:  08/01/22 276 lb 12.8 oz (125.6 kg)  03/31/22 271 lb (122.9 kg)  11/25/21 261 lb 3.2 oz (118.5 kg)      CMP     Component Value Date/Time   NA 146 (H) 03/28/2022 0822   K 5.0 03/28/2022 0822   CL 107 (H) 03/28/2022 0822   CO2 25 03/28/2022 0822   GLUCOSE 81 03/28/2022 0822   GLUCOSE 213 (H) 05/07/2020 1355   BUN 13 03/28/2022 0822   CREATININE 0.71 03/28/2022 0822   CREATININE 0.67 07/08/2019 0710   CALCIUM 9.4 03/28/2022 0822   PROT 6.3 03/28/2022 0822   ALBUMIN 3.9 03/28/2022 0822   AST 16 03/28/2022 0822   ALT 15 03/28/2022 0822   ALKPHOS 99 03/28/2022 0822   BILITOT 0.3 03/28/2022 0822   GFRNONAA >60 05/11/2020 1812   GFRNONAA 100 07/08/2019 0710   GFRAA >60 05/11/2020 1812   GFRAA 116 07/08/2019 0710    Diabetic Labs (most recent): Lab Results  Component Value Date   HGBA1C 6.9 08/01/2022   HGBA1C 6.7 03/31/2022   HGBA1C 6.4  11/25/2021   MICROALBUR 0.8 09/26/2018   MICROALBUR 2.2 09/08/2017   Lipid Panel     Component Value Date/Time   CHOL 143 03/28/2022 0822   TRIG 108 03/28/2022 0822   HDL 61 03/28/2022 0822   CHOLHDL 2.3 03/28/2022 0822   CHOLHDL 2.6 09/26/2018 0733   VLDL 44 (H) 07/17/2013 0701   LDLCALC 63 03/28/2022 0822   LDLCALC 73 09/26/2018 0733    Assessment & Plan:   1. Uncontrolled type 2 diabetes mellitus with hyperglycemia ([place]) - Patient has currently uncontrolled symptomatic type 2 DM since  56 years of age. [name] [name] presents with her CGM device.  Her device was downloaded and analyzed.  AGP report shows 66% time range, 25% level 1 hyperglycemia, 9% level 2 hyperglycemia.  She has no hypoglycemia.  Her point-of-care A1c was 6.7%.   She presents with some weight gain.  Her Trulicity was switched to Mounjaro 7.5 mg subcutaneously weekly and she remains on Tresiba 30 units nightly.    -Recent labs reviewed.  -her diabetes is complicated by obesity/sedentary life and [name] [name] remains at a high risk for more acute and chronic complications which include CAD, CVA, CKD, retinopathy, and neuropathy. These are all discussed in detail with the patient.  - I have counseled her on diet management and weight loss, by adopting a carbohydrate restricted/protein rich diet.  - she acknowledges that there is a room for improvement in her food and drink choices.  In light of the fact that she has gained 18 pounds since November 2022 despite 2 bariatric surgeries in the past, she is reapproached for lifestyle medicine.   - Suggestion is made for her to avoid simple carbohydrates  from her diet including Cakes, Sweet Desserts, Ice Cream, Soda (diet and regular), Sweet Tea, Candies, Chips, Cookies, Store Bought Juices, Alcohol , Artificial Sweeteners,  Coffee Creamer, and "Sugar-free" Products, Lemonade. This will help patient to have more stable blood glucose profile and potentially avoid  unintended weight gain.  The following Lifestyle Medicine recommendations according to [facility]) were discussed and and offered to patient and she  agrees to start the journey:  A. Whole Foods, Plant-Based Nutrition comprising of fruits and vegetables, plant-based proteins, whole-grain carbohydrates was discussed in detail  with the patient.   A list for source of those nutrients were also provided to the patient.  Patient will use only water or unsweetened tea for hydration. B.  The need to stay away from risky substances including alcohol, smoking; obtaining 7 to 9 hours of restorative sleep, at least 150 minutes of moderate intensity exercise weekly, the importance of healthy social connections,  and stress management techniques were discussed. C.  A full color page of  Calorie density of various food groups per pound showing examples of each food groups was provided to the patient.   - I have approached her with the following individualized plan to manage diabetes and patient agrees:   -   Based on her current glycemic profile she will not tolerate any higher dose of Tresiba, advised to continue Tresiba 30 units nightly.  She is advised to continue Mounjaro 7.5 mg subcutaneously weekly.   She will continue to use her CGM and report glycemic profile if they are below 70 or above 150 mg per DL at fasting.   - Patient specific target  A1c;  LDL, HDL, Triglycerides, were discussed in detail.  2) BP/HTN: -Her blood pressure is controlled to target.  She is advised to continue her current blood pressure medications including lisinopril 40 mg p.o. daily.    3) Lipids/HPL: Her recent lipid panel showed controlled LDL at 73.  She has triglycerides improved to 124 from 222.   Patient is not on  Statins, hesitates to go on statins, will be considered for low-dose statins on subsequent visits.  Whole food plant-based diet will help with dyslipidemia as well.  4)   Weight/Diet: Her current BMI is 43.74--CDE Consult has been initiated , exercise, and detailed carbohydrates information provided. -She is status post sleeve gastrectomy.   After she lost significant amount of weight, she is regaining some of it back.    She is advised to maintain close follow-up with her surgeons at [facility].   5) hypervitaminosis B12 and D: She is advised to finish her current supplies of vitamin D2 50,000 units weekly and not refill.   -She is taking oral vitamin B12, labs showing supraphysiologic levels of B12.  She is advised to discontinue vitamin B12 supplements until next measurement.    6) Chronic Care/Health Maintenance:  -she  is on ACEI/ARB  medications and  is encouraged to continue to follow up with Ophthalmology, Dentist,  Podiatrist at least yearly or according to recommendations, and advised to  stay away from smoking. I have recommended yearly flu vaccine and pneumonia vaccination at least every 5 years; moderate intensity exercise for up to 150 minutes weekly; and  sleep for at least 7 hours a day.  - I advised patient to maintain close follow up with Caryl Bis, MD for primary care needs.     I spent 41 minutes in the care of the patient today including review of labs from [facility], Lipids, Thyroid Function, Hematology (current and previous including abstractions from other facilities); face-to-face time discussing  her blood glucose readings/logs, discussing hypoglycemia and hyperglycemia episodes and symptoms, medications doses, her options of short and long term treatment based on the latest standards of care / guidelines;  discussion about incorporating lifestyle medicine;  and documenting the encounter. Risk reduction counseling performed per USPSTF guidelines to reduce obesity and cardiovascular risk factors.     Please refer to Patient Instructions for Blood Glucose Monitoring and Insulin/Medications Dosing Guide"  in media tab  for  additional information. Please  also refer to " Patient Self Inventory" in the Media  tab for reviewed elements of pertinent patient history.  Nunzio Cobbs participated in the discussions, expressed understanding, and voiced agreement with the above plans.  All questions were answered to her satisfaction. she is encouraged to contact clinic should she have any questions or concerns prior to her return visit.    Follow up plan: - Return in about 4 months (around 11/30/2022) for F/U with Pre-visit Labs, Meter/CGM/Logs, A1c here.  Glade Lloyd, MD Phone: [phone]  Fax: [phone]   08/01/2022, 6:29 PM This note was partially dictated with voice recognition software. Similar sounding words can be transcribed inadequately or may not  be corrected upon review.

## 2022-08-20 ENCOUNTER — Other Ambulatory Visit: Payer: Self-pay | Admitting: "Endocrinology

## 2022-08-20 DIAGNOSIS — E1165 Type 2 diabetes mellitus with hyperglycemia: Secondary | ICD-10-CM

## 2022-10-14 ENCOUNTER — Other Ambulatory Visit: Payer: Self-pay | Admitting: "Endocrinology

## 2022-10-14 DIAGNOSIS — E1165 Type 2 diabetes mellitus with hyperglycemia: Secondary | ICD-10-CM

## 2022-11-11 ENCOUNTER — Other Ambulatory Visit: Payer: Self-pay | Admitting: "Endocrinology

## 2022-11-11 DIAGNOSIS — E1165 Type 2 diabetes mellitus with hyperglycemia: Secondary | ICD-10-CM

## 2022-12-01 LAB — LIPID PANEL
Chol/HDL Ratio: 2.4 ratio (ref 0.0–4.4)
Cholesterol, Total: 116 mg/dL (ref 100–199)
HDL: 48 mg/dL (ref >39)
LDL Chol Calc (NIH): 48 mg/dL (ref 0–99)
Triglycerides: 106 mg/dL (ref 0–149)
VLDL Cholesterol Cal: 20 mg/dL (ref 5–40)

## 2022-12-01 LAB — COMPREHENSIVE METABOLIC PANEL
ALT: 11 IU/L (ref 0–32)
AST: 15 IU/L (ref 0–40)
Albumin/Globulin Ratio: 1.9 (ref 1.2–2.2)
Albumin: 3.8 g/dL (ref 3.8–4.9)
Alkaline Phosphatase: 95 IU/L (ref 44–121)
BUN/Creatinine Ratio: 22 (ref 9–23)
BUN: 15 mg/dL (ref 6–24)
Bilirubin Total: 0.4 mg/dL (ref 0.0–1.2)
CO2: 24 mmol/L (ref 20–29)
Calcium: 9.4 mg/dL (ref 8.7–10.2)
Chloride: 106 mmol/L (ref 96–106)
Creatinine, Ser: 0.67 mg/dL (ref 0.57–1.00)
Globulin, Total: 2 g/dL (ref 1.5–4.5)
Glucose: 148 mg/dL — ABNORMAL HIGH (ref 70–99)
Potassium: 4.6 mmol/L (ref 3.5–5.2)
Sodium: 143 mmol/L (ref 134–144)
Total Protein: 5.8 g/dL — ABNORMAL LOW (ref 6.0–8.5)
eGFR: 102 mL/min/{1.73_m2} (ref >59)

## 2022-12-01 LAB — TSH: TSH: 2.6 u[IU]/mL (ref 0.450–4.500)

## 2022-12-01 LAB — T4, FREE: Free T4: 1.1 ng/dL (ref 0.82–1.77)

## 2022-12-07 ENCOUNTER — Encounter: Payer: Self-pay | Admitting: "Endocrinology

## 2022-12-07 ENCOUNTER — Ambulatory Visit (INDEPENDENT_AMBULATORY_CARE_PROVIDER_SITE_OTHER): Payer: Commercial Managed Care - PPO | Admitting: "Endocrinology

## 2022-12-07 VITALS — Ht 66.0 in | Wt 274.0 lb

## 2022-12-07 DIAGNOSIS — I10 Essential (primary) hypertension: Secondary | ICD-10-CM

## 2022-12-07 DIAGNOSIS — E1165 Type 2 diabetes mellitus with hyperglycemia: Secondary | ICD-10-CM | POA: Diagnosis not present

## 2022-12-07 DIAGNOSIS — Z5320 Procedure and treatment not carried out because of patient's decision for unspecified reasons: Secondary | ICD-10-CM | POA: Diagnosis not present

## 2022-12-07 LAB — POCT GLYCOSYLATED HEMOGLOBIN (HGB A1C): Hemoglobin A1C: 7.4 % — AB (ref 4.0–5.6)

## 2022-12-07 MED ORDER — TRESIBA FLEXTOUCH 200 UNIT/ML ~~LOC~~ SOPN: 40.0000 [IU] | PEN_INJECTOR | Freq: Every evening | SUBCUTANEOUS | 1 refills | Status: DC

## 2022-12-07 NOTE — Progress Notes (Signed)
12/07/2022  Endocrinology follow-up note   Subjective:    Patient ID: [name] [name], female    DOB: 08/02/1966.  she is being seen in follow-up for the  management of currently uncontrolled symptomatic diabetes requested by  Caryl Bis, MD.   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 CE: TI ULCER   ASCA 4.3 NL pANCA 7.4   Obesity, Class III, BMI 40-49.9 (morbid obesity) ([place]) AUG 2010 305 LBS   MAY 2012 267   Vitamin D deficiency    Past Surgical History:  Procedure Laterality Date   ABDOMINAL EXPLORATION SURGERY  May 05, 2009 MM   INFLAMMED TI (SEROSITIS), "DIDN'T LOOK LIKE CROHN'S"   CHOLECYSTECTOMY     COLONOSCOPY  DEC 2010 WO   COLONOSCOPY  OCT 2011   MILD ERYTHEMA IN TI (30-40 CM), NL SB/COLON Bx   ESOPHAGOGASTRODUODENOSCOPY N/A 04/14/2014   Procedure: ESOPHAGOGASTRODUODENOSCOPY (EGD);  Surgeon: Danie Binder, MD;  [facility];  Service: Endoscopy;  Laterality: N/A;  8:30 AM   ESOPHAGOGASTRODUODENOSCOPY N/A 05/11/2020   Procedure: UPPER GASTROINTESTINAL ENDOSCOPY;  Surgeon: Johnathan Hausen, MD;  Location: [facility] ORS;  Service: General;  Laterality: N/A;   LAPAROSCOPIC GASTRIC BANDING  AUG 2010 BMI 50   GASTRIC POUCH 2-3 CM   LAPAROSCOPIC GASTRIC SLEEVE RESECTION N/A 05/11/2020   Procedure: LAPAROSCOPIC SLEEVE GASTRECTOMY;  Surgeon: Johnathan Hausen, MD;  Location: [facility] ORS;  Service: General;  Laterality: N/A;   UMBILICAL HERNIA REPAIR  AUG 2010 MATTHEW MARTIN   INCARCERATED OMENTUM   UPPER GASTROINTESTINAL ENDOSCOPY  MAY 2012 GIVENS CAPSULE PLACEMENT   GASTRITIS   Social History   Socioeconomic History   Marital status: Divorced    Spouse name: Not on file   Number of children: Not on file   Years of education: Not on file   Highest education level: Not on  file  Occupational History   Not on file  Tobacco Use   Smoking status: Never   Smokeless tobacco: Never   Tobacco comments:    Never smoked  Vaping Use   Vaping Use: Never used  Substance and Sexual Activity   Alcohol use: No   Drug use: No   Sexual activity: Not on file  Other Topics Concern   Not on file  Social History Narrative   MARRIED, NO KIDS   Social Determinants of Health   Financial Resource Strain: Not on file  Food Insecurity: Not on file  Transportation Needs: Not on file  Physical Activity: Not on file  Stress: Not on file  Social Connections: Not on file   Outpatient Encounter Medications as of 12/07/2022  Medication Sig   AMLODIPINE BENZOATE PO Take 5 mg by mouth daily.   atorvastatin (LIPITOR) 10 MG tablet Take 10 mg by mouth daily.   Continuous Blood Gluc Receiver (FREESTYLE LIBRE 2 READER) DEVI USE TO CHECK GLUCOSE AS DIRECTED   Continuous Blood Gluc Sensor (FREESTYLE LIBRE 2 SENSOR) MISC USE AND CHANGE SENSOR EVERY 14 DAYS   DULoxetine (CYMBALTA) 30 MG capsule Take 30 mg by mouth daily.   gabapentin (NEURONTIN) 300 MG capsule Take 600 mg by mouth at bedtime.   glucose blood (ONETOUCH VERIO) test strip USE AS INSTRUCTED TO TEST BLOOD SUGAR  FOUR TIMES DAILY   insulin degludec (TRESIBA FLEXTOUCH) 200 UNIT/ML FlexTouch Pen Inject 40 Units into the skin at bedtime.   Insulin Pen Needle (B-D UF III MINI PEN NEEDLES) 31G X 5 MM MISC USE AS DIRECTED WITH INSULIN INJECTIONS QHS   Lancets Thin MISC 1 each by Does not apply route 4 (four) times daily. Use to test blood glucose 4 times daily   lisinopril (PRINIVIL,ZESTRIL) 40 MG tablet Take 40 mg by mouth daily.   metoprolol succinate (TOPROL-XL) 25 MG 24 hr tablet Take 25 mg by mouth daily.   pantoprazole (PROTONIX) 40 MG tablet Take 1 tablet (40 mg total) by mouth daily.   potassium chloride (K-DUR) 10 MEQ tablet Take 10 mEq by mouth daily as needed (Take only whe take Torsemide).    tirzepatide (MOUNJARO) 15  MG/0.5ML Pen Inject 15 mg into the skin once a week.   torsemide (DEMADEX) 20 MG tablet Take 20 mg by mouth daily as needed (Excess fluid).   [DISCONTINUED] Blood Glucose Monitoring Suppl (ONETOUCH VERIO) w/Device KIT 1 each by Does not apply route as needed.   [DISCONTINUED] TRESIBA FLEXTOUCH 200 UNIT/ML FlexTouch Pen INJECT 30 UNITS SUBCUTANEOUS AT BEDTIME   No facility-administered encounter medications on file as of 12/07/2022.    ALLERGIES: No Known Allergies  VACCINATION STATUS:  There is no immunization history on file for this patient.  Diabetes She presents for her follow-up diabetic visit. She has type 2 diabetes mellitus. Onset time: She was diagnosed at approximate age of 53 years. Her disease course has been worsening. There are no hypoglycemic associated symptoms. Pertinent negatives for hypoglycemia include no confusion, headaches, pallor or seizures. Pertinent negatives for diabetes include no chest pain, no fatigue and no polyphagia. There are no hypoglycemic complications. Symptoms are worsening. There are no diabetic complications. Risk factors for coronary artery disease include diabetes mellitus, dyslipidemia, family history, obesity, hypertension and sedentary lifestyle. Current diabetic treatments: She is taking toujeo 50-75 units twice a day,jardiance 25 mg by mouth daily. Her weight is fluctuating minimally (She is status post sleeve gastrectomy, lost 60pounds since last visit.). She is following a generally unhealthy diet. When asked about meal planning, she reported none. She has not had a previous visit with a dietitian. She rarely participates in exercise. Her home blood glucose trend is increasing steadily. Her breakfast blood glucose range is generally 180-200 mg/dl. Her lunch blood glucose range is generally 180-200 mg/dl. Her dinner blood glucose range is generally 180-200 mg/dl. Her bedtime blood glucose range is generally 180-200 mg/dl. Her overall blood glucose  range is 180-200 mg/dl. ([name] [name] presents with her CGM device showing significantly above target glycemic profile.  Her AGP report shows 42% time in range, 58% level 1 hyperglycemia.  No hypoglycemia.  Her point-of-care A1c is 7.4% increasing from 6.9%.   ) An ACE inhibitor/angiotensin II receptor blocker is being taken. Eye exam is current.  Hypertension This is a chronic problem. The current episode started more than 1 year ago. The problem is controlled. Pertinent negatives include no chest pain, headaches, palpitations or shortness of breath. Risk factors for coronary artery disease include diabetes mellitus, obesity, sedentary lifestyle, family history and dyslipidemia. Past treatments include ACE inhibitors.    Review of systems Lost 45 pounds since her bariatric surgery.  Objective:    Ht 5\' 6"  (1.676 m)   Wt 274 lb (124.3 kg)   LMP 11/01/2015   BMI 44.22 kg/m   Wt Readings from Last 3 Encounters:  12/07/22  274 lb (124.3 kg)  08/01/22 276 lb 12.8 oz (125.6 kg)  03/31/22 271 lb (122.9 kg)      CMP     Component Value Date/Time   NA 143 11/30/2022 0807   K 4.6 11/30/2022 0807   CL 106 11/30/2022 0807   CO2 24 11/30/2022 0807   GLUCOSE 148 (H) 11/30/2022 0807   GLUCOSE 213 (H) 05/07/2020 1355   BUN 15 11/30/2022 0807   CREATININE 0.67 11/30/2022 0807   CREATININE 0.67 07/08/2019 0710   CALCIUM 9.4 11/30/2022 0807   PROT 5.8 (L) 11/30/2022 0807   ALBUMIN 3.8 11/30/2022 0807   AST 15 11/30/2022 0807   ALT 11 11/30/2022 0807   ALKPHOS 95 11/30/2022 0807   BILITOT 0.4 11/30/2022 0807   GFRNONAA >60 05/11/2020 1812   GFRNONAA 100 07/08/2019 0710   GFRAA >60 05/11/2020 1812   GFRAA 116 07/08/2019 0710    Diabetic Labs (most recent): Lab Results  Component Value Date   HGBA1C 7.4 (A) 12/07/2022   HGBA1C 6.9 08/01/2022   HGBA1C 6.7 03/31/2022   MICROALBUR 0.8 09/26/2018   MICROALBUR 2.2 09/08/2017   Lipid Panel     Component Value Date/Time   CHOL 116  11/30/2022 0807   TRIG 106 11/30/2022 0807   HDL 48 11/30/2022 0807   CHOLHDL 2.4 11/30/2022 0807   CHOLHDL 2.6 09/26/2018 0733   VLDL 44 (H) 07/17/2013 0701   LDLCALC 48 11/30/2022 0807   LDLCALC 73 09/26/2018 0733    Assessment & Plan:   1. Uncontrolled type 2 diabetes mellitus with hyperglycemia ([place]) - Patient has currently uncontrolled symptomatic type 2 DM since  57 years of age.  [name] [name] presents with her CGM device showing significantly above target glycemic profile.  Her AGP report shows 42% time in range, 58% level 1 hyperglycemia.  No hypoglycemia.  Her point-of-care A1c is 7.4% increasing from 6.9%.      -Recent labs reviewed.  -her diabetes is complicated by obesity/sedentary life and [name] [name] remains at a high risk for more acute and chronic complications which include CAD, CVA, CKD, retinopathy, and neuropathy. These are all discussed in detail with the patient.  - I have counseled her on diet management and weight loss, by adopting a carbohydrate restricted/protein rich diet.  - she acknowledges that there is a room for improvement in her food and drink choices. - Suggestion is made for her to avoid simple carbohydrates  from her diet including Cakes, Sweet Desserts, Ice Cream, Soda (diet and regular), Sweet Tea, Candies, Chips, Cookies, Store Bought Juices, Alcohol , Artificial Sweeteners,  Coffee Creamer, and "Sugar-free" Products, Lemonade. This will help patient to have more stable blood glucose profile and potentially avoid unintended weight gain.  The following Lifestyle Medicine recommendations according to [facility]) were discussed and and offered to patient and she  agrees to start the journey:  A. Whole Foods, Plant-Based Nutrition comprising of fruits and vegetables, plant-based proteins, whole-grain carbohydrates was discussed in detail with the patient.   A list for source of those nutrients were also provided  to the patient.  Patient will use only water or unsweetened tea for hydration. B.  The need to stay away from risky substances including alcohol, smoking; obtaining 7 to 9 hours of restorative sleep, at least 150 minutes of moderate intensity exercise weekly, the importance of healthy social connections,  and stress management techniques were discussed. C.  A full color page of  Calorie density of various food groups per pound showing examples of each food groups was provided to the patient.   - I have approached her with the following individualized plan to manage diabetes and patient agrees:   -   Based on her current glycemic profile she will need higher dose of basal insulin.  I discussed and increase her Tyler Aas to 40 units nightly, advised to continue Mounjaro 15 units subcutaneously weekly.    She will continue to use her CGM and report glycemic profile if they are below 70 or above 150 mg per DL at fasting.   - Patient specific target  A1c;  LDL, HDL, Triglycerides, were discussed in detail.  2) BP/HTN: -Blood pressure is controlled to target.  She is advised to continue her current medications including lisinopril 40 mg p.o. daily at breakfast.    3) Lipids/HPL: Her recent lipid panel showed controlled LDL at 63.  She is not on statins.  She states to go on statins.  She did not engage, however whole food plant-based diet will help with dyslipidemia as well.  4)  Weight/Diet: Her current BMI is increasing to 44.68-CDE Consult has been initiated , exercise, and detailed carbohydrates information provided. -She is status post sleeve gastrectomy.   After she lost significant amount of weight, she is regaining some of it back.    She is advised to maintain close follow-up with her surgeons at [facility] bariatric surgery.   5) hypervitaminosis B12 and D: She is advised to finish her current supplies of vitamin D2 50,000 units weekly and not refill.   -She is taking oral vitamin B12,  labs showing supraphysiologic levels of B12.  She is advised to discontinue vitamin B12 supplements until next measurement.    6) Chronic Care/Health Maintenance:  -she  is on ACEI/ARB  medications and  is encouraged to continue to follow up with Ophthalmology, Dentist,  Podiatrist at least yearly or according to recommendations, and advised to  stay away from smoking. I have recommended yearly flu vaccine and pneumonia vaccination at least every 5 years; moderate intensity exercise for up to 150 minutes weekly; and  sleep for at least 7 hours a day.  - I advised patient to maintain close follow up with Caryl Bis, MD for primary care needs.      I spent  26  minutes in the care of the patient today including review of labs from [place], Lipids, Thyroid Function, Hematology (current and previous including abstractions from other facilities); face-to-face time discussing  her blood glucose readings/logs, discussing hypoglycemia and hyperglycemia episodes and symptoms, medications doses, her options of short and long term treatment based on the latest standards of care / guidelines;  discussion about incorporating lifestyle medicine;  and documenting the encounter. Risk reduction counseling performed per USPSTF guidelines to reduce  obesity and cardiovascular risk factors.     Please refer to Patient Instructions for Blood Glucose Monitoring and Insulin/Medications Dosing Guide"  in media tab for additional information. Please  also refer to " Patient Self Inventory" in the Media  tab for reviewed elements of pertinent patient history.  [name] [name] participated in the discussions, expressed understanding, and voiced agreement with the above plans.  All questions were answered to her satisfaction. she is encouraged to contact clinic should she have any questions or concerns prior to her return visit.    Follow up plan: - Return in about 4 months (around 04/08/2023) for Bring Meter/CGM  Device/Logs- A1c  in Office.  Glade Lloyd, MD Phone: [phone]  Fax: [phone]   12/07/2022, 5:03 PM This note was partially dictated with voice recognition software. Similar sounding words can be transcribed inadequately or may not  be corrected upon review.

## 2022-12-07 NOTE — Patient Instructions (Signed)

## 2022-12-22 ENCOUNTER — Encounter (HOSPITAL_COMMUNITY): Payer: Self-pay | Admitting: *Deleted

## 2023-02-12 NOTE — Progress Notes (Signed)
CARDIOLOGY CONSULT NOTE       Patient ID: [name] [name] MRN: [id number] DOB/AGE: 57-Feb-1967 57 y.o.  Admit date: (Not on file) Referring Physician: Reuel Boom Primary Physician: Richardean Chimera, MD Primary Cardiologist: New Reason for Consultation: Palpitations  Active Problems:   * No active hospital problems. *   HPI:  57 y.o. referred by Dr Reuel Boom for palpitations History of HTN, Colitis, HLD and poorly controlled DM. A1c 12/07/22 7.4. 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 Nothing sustained Mostly getting out of car or on initial brisk walking then resolves May be just due to positional changes and deconditioning Started about a month ago. Not getting worse   ROS All other systems reviewed and negative except as noted above  Past Medical History:  Diagnosis Date   Crohn's colitis ([facility])    Diverticulosis OCT 2011    PAN-COLONIC   DM (diabetes mellitus) ([facility])    Dyspnea    On exertion   HTN (hypertension)    Hyperlipemia    Ileitis, terminal ([facility]) JUL 2012 CE: TI ULCER   ASCA 4.3 NL pANCA 7.4   Obesity, Class III, BMI 40-49.9 (morbid obesity) ([facility]) AUG 2010 305 LBS   MAY 2012 267   Vitamin D deficiency     Family History  Problem Relation Age of Onset   Diabetes Mother    Diabetes Father    Diabetes Other    Colon cancer Neg Hx    Colon polyps Neg Hx    Stomach cancer Neg Hx     Social History   Socioeconomic History   Marital status: Divorced    Spouse name: Not on file   Number of children: Not on file   Years of education: Not on file   Highest education level: Not on file  Occupational History   Not on file  Tobacco Use   Smoking status: Never   Smokeless tobacco: Never   Tobacco comments:    Never smoked  Vaping Use   Vaping Use: Never used  Substance and Sexual  Activity   Alcohol use: No   Drug use: No   Sexual activity: Not on file  Other Topics Concern   Not on file  Social History Narrative   MARRIED, NO KIDS   Social Determinants of Health   Financial Resource Strain: Not on file  Food Insecurity: Not on file  Transportation Needs: Not on file  Physical Activity: Not on file  Stress: Not on file  Social Connections: Not on file  Intimate Partner Violence: Not on file    Past Surgical History:  Procedure Laterality Date   ABDOMINAL EXPLORATION SURGERY  May 05, 2009 MM   INFLAMMED TI (SEROSITIS), "DIDN'T LOOK LIKE CROHN'S"   CHOLECYSTECTOMY     COLONOSCOPY  DEC 2010 WO   COLONOSCOPY  OCT 2011   MILD ERYTHEMA IN TI (30-40 CM), NL SB/COLON Bx   ESOPHAGOGASTRODUODENOSCOPY N/A 04/14/2014   Procedure: ESOPHAGOGASTRODUODENOSCOPY (EGD);  Surgeon: West Bali, MD;  [facility];  Service: Endoscopy;  Laterality: N/A;  8:30 AM   ESOPHAGOGASTRODUODENOSCOPY N/A 05/11/2020   Procedure: UPPER GASTROINTESTINAL ENDOSCOPY;  Surgeon: Luretha Murphy, MD;  Location: [facility];  Service: General;  Laterality: N/A;   LAPAROSCOPIC GASTRIC BANDING  AUG 2010 BMI 50   GASTRIC POUCH 2-3 CM  LAPAROSCOPIC GASTRIC SLEEVE RESECTION N/A 05/11/2020   Procedure: LAPAROSCOPIC SLEEVE GASTRECTOMY;  Surgeon: Luretha Murphy, MD;  Location: [facility];  Service: General;  Laterality: N/A;   UMBILICAL HERNIA REPAIR  AUG 2010 MATTHEW MARTIN   INCARCERATED OMENTUM   UPPER GASTROINTESTINAL ENDOSCOPY  MAY 2012 GIVENS CAPSULE PLACEMENT   GASTRITIS      Current Outpatient Medications:    AMLODIPINE BENZOATE PO, Take 5 mg by mouth daily., Disp: , Rfl:    atorvastatin (LIPITOR) 10 MG tablet, Take 10 mg by mouth daily., Disp: , Rfl:    Continuous Blood Gluc Receiver (FREESTYLE LIBRE 2 READER) DEVI, USE TO CHECK GLUCOSE AS DIRECTED, Disp: 1 each, Rfl: 0   Continuous Blood Gluc Sensor (FREESTYLE LIBRE 2 SENSOR) MISC, USE AND CHANGE SENSOR EVERY 14 DAYS, Disp: 2 each,  Rfl: 2   DULoxetine (CYMBALTA) 30 MG capsule, Take 30 mg by mouth daily., Disp: , Rfl:    glucose blood (ONETOUCH VERIO) test strip, USE AS INSTRUCTED TO TEST BLOOD SUGAR FOUR TIMES DAILY, Disp: 200 strip, Rfl: 1   insulin degludec (TRESIBA FLEXTOUCH) 200 UNIT/ML FlexTouch Pen, Inject 40 Units into the skin at bedtime., Disp: 18 mL, Rfl: 1   Insulin Pen Needle (B-D UF III MINI PEN NEEDLES) 31G X 5 MM MISC, USE AS DIRECTED WITH INSULIN INJECTIONS QHS, Disp: 100 each, Rfl: 1   Lancets Thin MISC, 1 each by Does not apply route 4 (four) times daily. Use to test blood glucose 4 times daily, Disp: 200 each, Rfl: 1   lisinopril (PRINIVIL,ZESTRIL) 40 MG tablet, Take 40 mg by mouth daily., Disp: , Rfl:    loratadine (CLARITIN) 10 MG tablet, Take 10 mg by mouth daily., Disp: , Rfl:    metoprolol succinate (TOPROL-XL) 25 MG 24 hr tablet, Take 25 mg by mouth daily., Disp: , Rfl:    pantoprazole (PROTONIX) 40 MG tablet, Take 1 tablet (40 mg total) by mouth daily., Disp: 90 tablet, Rfl: 0   potassium chloride (K-DUR) 10 MEQ tablet, Take 10 mEq by mouth daily as needed (Take only whe take Torsemide). , Disp: , Rfl:    tirzepatide (MOUNJARO) 15 MG/0.5ML Pen, Inject 15 mg into the skin once a week., Disp: , Rfl:    torsemide (DEMADEX) 20 MG tablet, Take 20 mg by mouth daily as needed (Excess fluid)., Disp: , Rfl:    gabapentin (NEURONTIN) 300 MG capsule, Take 600 mg by mouth at bedtime. (Patient not taking: Reported on 02/20/2023), Disp: , Rfl:     Physical Exam: Blood pressure (!) 140/82, pulse 67, height 5\' 6"  (1.676 m), weight 272 lb 6.4 oz (123.6 kg), last menstrual period 11/01/2015, SpO2 100 %.    Affect appropriate Healthy:  appears stated age HEENT: normal Neck supple with no adenopathy JVP normal no bruits no thyromegaly Lungs clear with no wheezing and good diaphragmatic motion Heart:  S1/S2 no murmur, no rub, gallop or click PMI normal Abdomen: benighn, BS positve, no tenderness, no AAA no  bruit.  No HSM or HJR Distal pulses intact with no bruits No edema Neuro non-focal Skin warm and dry No muscular weakness   Labs:   Lab Results  Component Value Date   WBC 10.1 05/13/2020   HGB 11.2 (L) 05/13/2020   HCT 36.9 05/13/2020   MCV 91.3 05/13/2020   PLT 181 05/13/2020   No results for input(s): "NA", "K", "CL", "CO2", "BUN", "CREATININE", "CALCIUM", "PROT", "BILITOT", "ALKPHOS", "ALT", "AST", "GLUCOSE" in the last 168 hours.  Invalid input(s): "LABALBU"  No results found for: "CKTOTAL", "CKMB", "CKMBINDEX", "TROPONINI"  Lab Results  Component Value Date   CHOL 116 11/30/2022   CHOL 143 03/28/2022   CHOL 147 07/26/2021   Lab Results  Component Value Date   HDL 48 11/30/2022   HDL 61 03/28/2022   HDL 67 07/26/2021   Lab Results  Component Value Date   LDLCALC 48 11/30/2022   LDLCALC 63 03/28/2022   LDLCALC 65 07/26/2021   Lab Results  Component Value Date   TRIG 106 11/30/2022   TRIG 108 03/28/2022   TRIG 77 07/26/2021   Lab Results  Component Value Date   CHOLHDL 2.4 11/30/2022   CHOLHDL 2.3 03/28/2022   CHOLHDL 2.2 07/26/2021   No results found for: "LDLDIRECT"    Radiology: No results found.  EKG: SR rate 65 normal    ASSESSMENT AND PLAN:   Palpitations:  benign sounding ECG SR normal   will need 30 day Zio monitor TTE to r/o structural heart dx TSH normal 11/30/22 K 4.6 and normal liver/renal function  CAD Risk:  given poorly controlled DM and lack of chest pain discussed coronary calcium score to risk stratify DM:  F/U Dr Fransico Him poor control A1c 7.4 with neuropathy  HLD:  LDL at goal continue statin  HTN:  continue ACE  TTE 30 day monitor Coronary Calcium Score  F/U PRN pending study results   Signed: Charlton Haws 02/20/2023, 11:11 AM

## 2023-02-20 ENCOUNTER — Encounter: Payer: Self-pay | Admitting: Cardiovascular Disease

## 2023-02-20 ENCOUNTER — Ambulatory Visit: Payer: Commercial Managed Care - PPO | Attending: Cardiovascular Disease | Admitting: Cardiovascular Disease

## 2023-02-20 DIAGNOSIS — R0600 Dyspnea, unspecified: Secondary | ICD-10-CM

## 2023-02-20 DIAGNOSIS — R0609 Other forms of dyspnea: Secondary | ICD-10-CM

## 2023-02-20 DIAGNOSIS — R002 Palpitations: Secondary | ICD-10-CM | POA: Diagnosis not present

## 2023-02-20 NOTE — Patient Instructions (Signed)
Medication Instructions:  Your physician recommends that you continue on your current medications as directed. Please refer to the Current Medication list given to you today.  *If you need a refill on your cardiac medications before your next appointment, please call your pharmacy*   Lab Work: None   If you have labs (blood work) drawn today and your tests are completely normal, you will receive your results only by: MyChart Message (if you have MyChart) OR A paper copy in the mail If you have any lab test that is abnormal or we need to change your treatment, we will call you to review the results.   Testing/Procedures: Calcium Score  Your physician has requested that you have an echocardiogram. Echocardiography is a painless test that uses sound waves to create images of your heart. It provides your doctor with information about the size and shape of your heart and how well your heart's chambers and valves are working. This procedure takes approximately one hour. There are no restrictions for this procedure. Please do NOT wear cologne, perfume, aftershave, or lotions (deodorant is allowed). Please arrive 15 minutes prior to your appointment time.  Your physician has recommended that you wear an event monitor. Event monitors are medical devices that record the heart's electrical activity. Doctors most often [place] these monitors to diagnose arrhythmias. Arrhythmias are problems with the speed or rhythm of the heartbeat. The monitor is a small, portable device. You can wear one while you do your normal daily activities. This is usually used to diagnose what is causing palpitations/syncope (passing out).    Follow-Up: At [facility], you and your health needs are our priority.  As part of our continuing mission to provide you with exceptional heart care, we have created designated Provider Care Teams.  These Care Teams include your primary Cardiologist (physician) and Advanced Practice  Providers (APPs -  Physician Assistants and Nurse Practitioners) who all work together to provide you with the care you need, when you need it.  We recommend signing up for the patient portal called "MyChart".  Sign up information is provided on this After Visit Summary.  MyChart is used to connect with patients for Virtual Visits (Telemedicine).  Patients are able to view lab/test results, encounter notes, upcoming appointments, etc.  Non-urgent messages can be sent to your provider as well.   To learn more about what you can do with MyChart, go to [email or web address].    Your next appointment:    As Needed   Provider:   Charlton Haws, MD    Other Instructions Thank you for choosing [facility]!

## 2023-02-20 NOTE — Addendum Note (Signed)
Addended by: Kerney Elbe on: 02/20/2023 12:19 PM   Modules accepted: Orders

## 2023-02-28 DIAGNOSIS — R002 Palpitations: Secondary | ICD-10-CM

## 2023-03-06 ENCOUNTER — Ambulatory Visit (HOSPITAL_COMMUNITY)
Admission: RE | Admit: 2023-03-06 | Discharge: 2023-03-06 | Disposition: A | Discharge status: Home / Self Care | Payer: Commercial Managed Care - PPO | Source: Ambulatory Visit | Attending: Cardiovascular Disease | Admitting: Cardiovascular Disease

## 2023-03-06 DIAGNOSIS — R0609 Other forms of dyspnea: Secondary | ICD-10-CM | POA: Insufficient documentation

## 2023-03-15 ENCOUNTER — Other Ambulatory Visit: Payer: Self-pay | Admitting: "Endocrinology

## 2023-03-15 DIAGNOSIS — E1165 Type 2 diabetes mellitus with hyperglycemia: Secondary | ICD-10-CM

## 2023-04-03 ENCOUNTER — Other Ambulatory Visit: Payer: Self-pay

## 2023-04-03 ENCOUNTER — Emergency Department (HOSPITAL_COMMUNITY): Payer: Commercial Managed Care - PPO

## 2023-04-03 ENCOUNTER — Emergency Department (HOSPITAL_COMMUNITY)
Admission: EM | Admit: 2023-04-03 | Discharge: 2023-04-03 | Disposition: A | Discharge status: Home / Self Care | Payer: Commercial Managed Care - PPO | Attending: Emergency Medicine | Admitting: Emergency Medicine

## 2023-04-03 ENCOUNTER — Encounter (HOSPITAL_COMMUNITY): Payer: Self-pay | Admitting: Emergency Medicine

## 2023-04-03 ENCOUNTER — Ambulatory Visit: Payer: Commercial Managed Care - PPO | Attending: Cardiovascular Disease

## 2023-04-03 DIAGNOSIS — R1031 Right lower quadrant pain: Secondary | ICD-10-CM | POA: Diagnosis not present

## 2023-04-03 DIAGNOSIS — Z794 Long term (current) use of insulin: Secondary | ICD-10-CM | POA: Insufficient documentation

## 2023-04-03 DIAGNOSIS — R109 Unspecified abdominal pain: Secondary | ICD-10-CM

## 2023-04-03 DIAGNOSIS — R002 Palpitations: Secondary | ICD-10-CM

## 2023-04-03 LAB — HEPATIC FUNCTION PANEL
ALT: 13 U/L (ref 0–44)
AST: 16 U/L (ref 15–41)
Albumin: 3.5 g/dL (ref 3.5–5.0)
Alkaline Phosphatase: 73 U/L (ref 38–126)
Bilirubin, Direct: 0.1 mg/dL (ref 0.0–0.2)
Indirect Bilirubin: 0.5 mg/dL (ref 0.3–0.9)
Total Bilirubin: 0.6 mg/dL (ref 0.3–1.2)
Total Protein: 6.6 g/dL (ref 6.5–8.1)

## 2023-04-03 LAB — CBC WITH DIFFERENTIAL/PLATELET
Abs Immature Granulocytes: 0.03 10*3/uL (ref 0.00–0.07)
Basophils Absolute: 0 10*3/uL (ref 0.0–0.1)
Basophils Relative: 0 %
Eosinophils Absolute: 0.2 10*3/uL (ref 0.0–0.5)
Eosinophils Relative: 2 %
HCT: 38.3 % (ref 36.0–46.0)
Hemoglobin: 12.4 g/dL (ref 12.0–15.0)
Immature Granulocytes: 0 %
Lymphocytes Relative: 17 %
Lymphs Abs: 1.5 10*3/uL (ref 0.7–4.0)
MCH: 28 pg (ref 26.0–34.0)
MCHC: 32.4 g/dL (ref 30.0–36.0)
MCV: 86.5 fL (ref 80.0–100.0)
Monocytes Absolute: 0.4 10*3/uL (ref 0.1–1.0)
Monocytes Relative: 4 %
Neutro Abs: 7 10*3/uL (ref 1.7–7.7)
Neutrophils Relative %: 77 %
Platelets: 222 10*3/uL (ref 150–400)
RBC: 4.43 MIL/uL (ref 3.87–5.11)
RDW: 12.6 % (ref 11.5–15.5)
WBC: 9.1 10*3/uL (ref 4.0–10.5)
nRBC: 0 % (ref 0.0–0.2)

## 2023-04-03 LAB — LIPASE, BLOOD: Lipase: 44 U/L (ref 11–51)

## 2023-04-03 LAB — BASIC METABOLIC PANEL
Anion gap: 7 (ref 5–15)
BUN: 10 mg/dL (ref 6–20)
CO2: 23 mmol/L (ref 22–32)
Calcium: 8.9 mg/dL (ref 8.9–10.3)
Chloride: 106 mmol/L (ref 98–111)
Creatinine, Ser: 0.68 mg/dL (ref 0.44–1.00)
GFR, Estimated: >60 mL/min (ref >60)
Glucose, Bld: 185 mg/dL — ABNORMAL HIGH (ref 70–99)
Potassium: 3.7 mmol/L (ref 3.5–5.1)
Sodium: 136 mmol/L (ref 135–145)

## 2023-04-03 LAB — URINALYSIS, ROUTINE W REFLEX MICROSCOPIC
Bilirubin Urine: NEGATIVE
Glucose, UA: NEGATIVE mg/dL
Hgb urine dipstick: NEGATIVE
Ketones, ur: 5 mg/dL — AB
Leukocytes,Ua: NEGATIVE
Nitrite: NEGATIVE
Protein, ur: NEGATIVE mg/dL
Specific Gravity, Urine: 1.021 (ref 1.005–1.030)
pH: 6 (ref 5.0–8.0)

## 2023-04-03 MED ORDER — MORPHINE SULFATE (PF) 4 MG/ML IV SOLN
4.0000 mg | Freq: Once | INTRAVENOUS | Status: AC
  Administered 2023-04-03: 4 mg via INTRAVENOUS
  Filled 2023-04-03: qty 1

## 2023-04-03 MED ORDER — ONDANSETRON HCL 4 MG/2ML IJ SOLN
4.0000 mg | Freq: Once | INTRAMUSCULAR | Status: AC
  Administered 2023-04-03: 4 mg via INTRAVENOUS
  Filled 2023-04-03: qty 2

## 2023-04-03 MED ORDER — HYDROCODONE-ACETAMINOPHEN 5-325 MG PO TABS: 1.0000 | ORAL_TABLET | Freq: Four times a day (QID) | ORAL | 0 refills | Status: DC | PRN

## 2023-04-03 MED ORDER — IOHEXOL 300 MG/ML  SOLN
100.0000 mL | Freq: Once | INTRAMUSCULAR | Status: AC | PRN
  Administered 2023-04-03: 100 mL via INTRAVENOUS

## 2023-04-03 MED ORDER — HYDROCODONE-ACETAMINOPHEN 5-325 MG PO TABS
1.0000 | ORAL_TABLET | Freq: Once | ORAL | Status: AC
  Administered 2023-04-03: 1 via ORAL
  Filled 2023-04-03: qty 1

## 2023-04-03 NOTE — ED Provider Notes (Signed)
[place] EMERGENCY DEPARTMENT AT [facility] Provider Note   CSN: [id number] Arrival date & time: 04/03/23  1050     History  Chief Complaint  Patient presents with   Flank Pain    [name] [name] is a 57 y.o. female.  The ER with right lower quadrant pain that started last night.  She has history of kidney stones, unsure if this was similar because it has been a very long time.  She denies any dysuria, frequency, hematuria or other symptoms.  No nausea, but she did have some dry heaving when the pain was worse.  She had diarrhea yesterday but today is resolved.  No blood in the stool, no hematemesis.  She also has history of Crohn's disease, has not had a flare in 13 years and is on no medications at this time   Flank Pain       Home Medications Prior to Admission medications   Medication Sig Start Date End Date Taking? Authorizing Provider  HYDROcodone-acetaminophen (NORCO) 5-325 MG tablet Take 1 tablet by mouth every 6 (six) hours as needed. 04/03/23  Yes Lerone Onder A, PA-C  AMLODIPINE BENZOATE PO Take 5 mg by mouth daily.    [provider]  atorvastatin (LIPITOR) 10 MG tablet Take 10 mg by mouth daily. 04/18/20   [provider]  Continuous Blood Gluc Receiver (FREESTYLE LIBRE 2 READER) DEVI USE TO CHECK GLUCOSE AS DIRECTED 12/02/21   Nida, Denman George, MD  Continuous Glucose Sensor (FREESTYLE LIBRE 2 SENSOR) MISC USE AND CHANGE 1 SENSOR EVERY 14 DAYS 03/15/23   Roma Kayser, MD  DULoxetine (CYMBALTA) 30 MG capsule Take 30 mg by mouth daily. 04/14/20   [provider]  gabapentin (NEURONTIN) 300 MG capsule Take 600 mg by mouth at bedtime. Patient not taking: Reported on 02/20/2023 02/18/20   [provider]  glucose blood (ONETOUCH VERIO) test strip USE AS INSTRUCTED TO TEST BLOOD SUGAR FOUR TIMES DAILY 03/03/20   Nida, Denman George, MD  insulin degludec (TRESIBA FLEXTOUCH) 200 UNIT/ML FlexTouch Pen Inject 40 Units  into the skin at bedtime. 12/07/22   Roma Kayser, MD  Insulin Pen Needle (B-D UF III MINI PEN NEEDLES) 31G X 5 MM MISC USE AS DIRECTED WITH INSULIN INJECTIONS QHS 08/18/21   Dani Gobble, NP  Lancets Thin MISC 1 each by Does not apply route 4 (four) times daily. Use to test blood glucose 4 times daily 11/25/21   Roma Kayser, MD  lisinopril (PRINIVIL,ZESTRIL) 40 MG tablet Take 40 mg by mouth daily.    [provider]  loratadine (CLARITIN) 10 MG tablet Take 10 mg by mouth daily.    [provider]  metoprolol succinate (TOPROL-XL) 25 MG 24 hr tablet Take 25 mg by mouth daily. 04/01/20   [provider]  pantoprazole (PROTONIX) 40 MG tablet Take 1 tablet (40 mg total) by mouth daily. 05/13/20   Luretha Murphy, MD  potassium chloride (K-DUR) 10 MEQ tablet Take 10 mEq by mouth daily as needed (Take only whe take Torsemide).     [provider]  tirzepatide Greggory Keen) 15 MG/0.5ML Pen Inject 15 mg into the skin once a week. 06/28/22   [provider]  torsemide (DEMADEX) 20 MG tablet Take 20 mg by mouth daily as needed (Excess fluid).    [provider]      Allergies    Patient has no known allergies.    Review of Systems   Review  of Systems  Genitourinary:  Positive for flank pain.    Physical Exam Updated Vital Signs BP (!) 154/66   Pulse 62   Temp 98 F (36.7 C) (Oral)   Resp 19   Ht 5\' 6"  (1.676 m)   Wt 116.6 kg   LMP 11/01/2015   SpO2 99%   BMI 41.48 kg/m  Physical Exam Vitals and nursing note reviewed.  Constitutional:      General: She is not in acute distress.    Appearance: She is well-developed.  HENT:     Head: Normocephalic and atraumatic.  Eyes:     Conjunctiva/sclera: Conjunctivae normal.  Cardiovascular:     Rate and Rhythm: Normal rate and regular rhythm.     Heart sounds: No murmur heard. Pulmonary:     Effort: Pulmonary effort is normal. No respiratory distress.     Breath sounds:  Normal breath sounds.  Abdominal:     Palpations: Abdomen is soft.     Tenderness: There is abdominal tenderness in the right lower quadrant. There is no right CVA tenderness, left CVA tenderness, guarding or rebound. Negative signs include Murphy's sign, Rovsing's sign and McBurney's sign.  Musculoskeletal:        General: No swelling.     Cervical back: Neck supple.  Skin:    General: Skin is warm and dry.     Capillary Refill: Capillary refill takes less than 2 seconds.  Neurological:     Mental Status: She is alert.  Psychiatric:        Mood and Affect: Mood normal.     ED Results / Procedures / Treatments   Labs (all labs ordered are listed, but only abnormal results are displayed) Labs Reviewed  URINALYSIS, ROUTINE W REFLEX MICROSCOPIC - Abnormal; Notable for the following components:      Result Value   Ketones, ur 5 (*)    All other components within normal limits  BASIC METABOLIC PANEL - Abnormal; Notable for the following components:   Glucose, Bld 185 (*)    All other components within normal limits  CBC WITH DIFFERENTIAL/PLATELET  LIPASE, BLOOD  HEPATIC FUNCTION PANEL    EKG None  Radiology CT ABDOMEN PELVIS W CONTRAST  Result Date: 04/03/2023 CLINICAL DATA:  Right lower quadrant abdominal pain. History of Crohn disease and kidney stones. Right flank pain, nausea and vomiting, and dysuria starting last night. EXAM: CT ABDOMEN AND PELVIS WITH CONTRAST TECHNIQUE: Multidetector CT imaging of the abdomen and pelvis was performed using the standard protocol following bolus administration of intravenous contrast. RADIATION DOSE REDUCTION: This exam was performed according to the departmental dose-optimization program which includes automated exposure control, adjustment of the mA and/or kV according to patient size and/or use of iterative reconstruction technique. CONTRAST:  OMNIPAQUE IOHEXOL 300 MG/ML  SOLN COMPARISON:  03/06/2014 FINDINGS: Lower chest: Lung bases  are clear.  Small esophageal hiatal hernia. Hepatobiliary: No focal liver abnormality is seen. Status post cholecystectomy. No biliary dilatation. Pancreas: Unremarkable. No pancreatic ductal dilatation or surrounding inflammatory changes. Spleen: Normal in size without focal abnormality. Adrenals/Urinary Tract: No adrenal gland nodules. Left lower pole renal stone measuring 7 mm diameter. No hydronephrosis or hydroureter. Nephrograms are homogeneous and symmetrical. Bladder is unremarkable. Stomach/Bowel: Postoperative changes in the stomach consistent with gastric sleeve procedure. Small bowel and colon are mostly decompressed. No inflammatory changes or wall thickening appreciated. Scattered stool in the colon. Appendix is normal. Vascular/Lymphatic: Scattered aortic calcification. No aneurysm. No significant lymphadenopathy. Reproductive: Uterus and bilateral  adnexa are unremarkable. Other: No abdominal wall hernia or abnormality. No abdominopelvic ascites. Musculoskeletal: No acute or significant osseous findings. IMPRESSION: 1. No acute process demonstrated in the abdomen or pelvis. 2. 7 mm nonobstructing stone in the left kidney. No ureteral stones or obstruction. 3. Minimal aortic atherosclerosis. Electronically Signed   By: Burman Nieves M.D.   On: 04/03/2023 16:00    Procedures Procedures    Medications Ordered in ED Medications  morphine (PF) 4 MG/ML injection 4 mg (4 mg Intravenous Given 04/03/23 1500)  ondansetron (ZOFRAN) injection 4 mg (4 mg Intravenous Given 04/03/23 1459)  iohexol (OMNIPAQUE) 300 MG/ML solution 100 mL (100 mLs Intravenous Contrast Given 04/03/23 1536)  HYDROcodone-acetaminophen (NORCO/VICODIN) 5-325 MG per tablet 1 tablet (1 tablet Oral Given 04/03/23 1743)    ED Course/ Medical Decision Making/ A&P                             Medical Decision Making DDx: gastritis, gastroenteritis, appendicitis, cholecystitis, diverticulitis, DKA, nephrolithiasis, gastroparesis,  crohn's flare, other Imaging: CT abdomen pelvis shows no acute intra-abdominal process.  There is a stone in the left kidney that is nonobstructing.  Images reviewed and interpreted independently by me agree with radiology read  ED course: Patient presents ER for right flank pain, is fairly constant, somewhat better with certain positions, worse with other positions.  Skin is normal there is no rash, there is mild right flank tenderness.  Patient has no urinary symptoms.  UA is negative.  Labs show no leukocytosis, normal lipase hepatic function and renal function.  Normal electrolytes.  Patient's pain was well-controlled in the ED.  Discussed negative workup today and that she will need to follow-up closely as an outpatient, she was given strict return precautions.  She has reassuring vitals and exam.  Amount and/or Complexity of Data Reviewed External Data Reviewed: notes. Labs: ordered. Decision-making details documented in ED Course. Radiology: ordered and independent interpretation performed. Decision-making details documented in ED Course.  Risk Prescription drug management.           Final Clinical Impression(s) / ED Diagnoses Final diagnoses:  Flank pain    Rx / DC Orders ED Discharge Orders          Ordered    HYDROcodone-acetaminophen (NORCO) 5-325 MG tablet  Every 6 hours PRN        04/03/23 1838              Ma Rings, PA-C 04/03/23 1949    Franne Forts, DO 04/10/23 2329

## 2023-04-03 NOTE — ED Triage Notes (Signed)
Pt's presents with right flank, with N/V and dysuria, that started last night, history of kidney stones.

## 2023-04-03 NOTE — Discharge Instructions (Addendum)
You are seen today for right flank pain.  Your blood work was reassuring, urinalysis was normal, your CT scan showed a kidney stone on the left side that is not obstructing.  We did not find any reason for your pain today.  You are given medication to pain at home, please follow-up with your primary care doctor.  Especially if you have any continued pain, come back to the ER for new or worsening symptoms.

## 2023-04-05 ENCOUNTER — Ambulatory Visit (HOSPITAL_COMMUNITY)
Admission: RE | Admit: 2023-04-05 | Discharge: 2023-04-05 | Disposition: A | Discharge status: Home / Self Care | Payer: Commercial Managed Care - PPO | Source: Ambulatory Visit | Attending: Cardiovascular Disease | Admitting: Cardiovascular Disease

## 2023-04-05 DIAGNOSIS — R0600 Dyspnea, unspecified: Secondary | ICD-10-CM

## 2023-04-05 DIAGNOSIS — R002 Palpitations: Secondary | ICD-10-CM | POA: Diagnosis present

## 2023-04-05 DIAGNOSIS — R0609 Other forms of dyspnea: Secondary | ICD-10-CM | POA: Diagnosis present

## 2023-04-05 LAB — ECHOCARDIOGRAM COMPLETE
AR max vel: 2.27 cm2
AV Area VTI: 2.6 cm2
AV Area mean vel: 2.5 cm2
AV Mean grad: 8 mmHg
AV Peak grad: 16.2 mmHg
Ao pk vel: 2.01 m/s
Area-P 1/2: 2.62 cm2
S' Lateral: 2.6 cm

## 2023-04-05 NOTE — Progress Notes (Signed)
*  PRELIMINARY RESULTS* Echocardiogram 2D Echocardiogram has been performed.  Stacey Drain 04/05/2023, 1:41 PM

## 2023-04-19 ENCOUNTER — Encounter: Payer: Self-pay | Admitting: "Endocrinology

## 2023-04-19 ENCOUNTER — Ambulatory Visit (INDEPENDENT_AMBULATORY_CARE_PROVIDER_SITE_OTHER): Payer: Commercial Managed Care - PPO | Admitting: "Endocrinology

## 2023-04-19 VITALS — BP 150/86 | HR 72 | Ht 66.0 in | Wt 269.2 lb

## 2023-04-19 DIAGNOSIS — Z794 Long term (current) use of insulin: Secondary | ICD-10-CM | POA: Diagnosis not present

## 2023-04-19 DIAGNOSIS — I10 Essential (primary) hypertension: Secondary | ICD-10-CM

## 2023-04-19 DIAGNOSIS — E782 Mixed hyperlipidemia: Secondary | ICD-10-CM | POA: Diagnosis not present

## 2023-04-19 DIAGNOSIS — E1165 Type 2 diabetes mellitus with hyperglycemia: Secondary | ICD-10-CM

## 2023-04-19 LAB — POCT GLYCOSYLATED HEMOGLOBIN (HGB A1C): HbA1c, POC (controlled diabetic range): 7.1 % — AB (ref 0.0–7.0)

## 2023-04-19 MED ORDER — FREESTYLE LIBRE 3 SENSOR MISC: 1.0000 | 2 refills | Status: DC

## 2023-04-19 MED ORDER — FREESTYLE LIBRE 3 READER DEVI: 1.0000 | Freq: Once | 0 refills | Status: AC | PRN

## 2023-04-19 NOTE — Progress Notes (Signed)
04/19/2023  Endocrinology follow-up note   Subjective:    Patient ID: [name] [name], female    DOB: [birth year].  she is being seen in follow-up for the  management of currently uncontrolled symptomatic diabetes requested by  Richardean Chimera, MD.   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   ASCA 4.3 NL pANCA 7.4   Obesity, Class III, BMI 40-49.9 (morbid obesity) (HCC) AUG 2010 305 LBS   MAY 2012 267   Vitamin D deficiency    Past Surgical History:  Procedure Laterality Date   ABDOMINAL EXPLORATION SURGERY  May 05, 2009 MM   INFLAMMED TI (SEROSITIS), "DIDN'T LOOK LIKE CROHN'S"   CHOLECYSTECTOMY     COLONOSCOPY  DEC 2010 WO   COLONOSCOPY  OCT 2011   MILD ERYTHEMA IN TI (30-40 CM), NL SB/COLON Bx   ESOPHAGOGASTRODUODENOSCOPY N/A 04/14/2014   Procedure: ESOPHAGOGASTRODUODENOSCOPY (EGD);  Surgeon: West Bali, MD;  [facility];  Service: Endoscopy;  Laterality: N/A;  8:30 AM   ESOPHAGOGASTRODUODENOSCOPY N/A 05/11/2020   Procedure: UPPER GASTROINTESTINAL ENDOSCOPY;  Surgeon: Luretha Murphy, MD;  Location: [facility];  Service: General;  Laterality: N/A;   LAPAROSCOPIC GASTRIC BANDING  AUG 2010 BMI 50   GASTRIC POUCH 2-3 CM   LAPAROSCOPIC GASTRIC SLEEVE RESECTION N/A 05/11/2020   Procedure: LAPAROSCOPIC SLEEVE GASTRECTOMY;  Surgeon: Luretha Murphy, MD;  Location: [facility];  Service: General;  Laterality: N/A;   UMBILICAL HERNIA REPAIR  AUG 2010 MATTHEW MARTIN   INCARCERATED OMENTUM   UPPER GASTROINTESTINAL ENDOSCOPY  MAY 2012 GIVENS CAPSULE PLACEMENT   GASTRITIS   Social History   Socioeconomic History   Marital status: Divorced    Spouse name: Not on file   Number of children: Not on file   Years of education: Not on file   Highest education level: Not on  file  Occupational History   Not on file  Tobacco Use   Smoking status: Never   Smokeless tobacco: Never   Tobacco comments:    Never smoked  Vaping Use   Vaping status: Never Used  Substance and Sexual Activity   Alcohol use: No   Drug use: No   Sexual activity: Not on file  Other Topics Concern   Not on file  Social History Narrative   MARRIED, NO KIDS   Social Determinants of Health   Financial Resource Strain: Not on file  Food Insecurity: Not on file  Transportation Needs: Not on file  Physical Activity: Not on file  Stress: Not on file  Social Connections: Not on file   Outpatient Encounter Medications as of 04/19/2023  Medication Sig   Continuous Glucose Receiver (FREESTYLE LIBRE 3 READER) DEVI 1 Piece by Does not apply route once as needed for up to 1 dose.   Continuous Glucose Sensor (FREESTYLE LIBRE 3 SENSOR) MISC 1 Piece by Does not apply route every 14 (fourteen) days. Place 1 sensor on the skin every 14 days. Use to check glucose continuously   meloxicam (MOBIC) 15 MG tablet Take 15 mg by mouth daily.   AMLODIPINE BENZOATE PO Take 5 mg by mouth daily.   atorvastatin (LIPITOR) 10 MG tablet Take 10 mg by mouth daily.   DULoxetine (CYMBALTA)  30 MG capsule Take 30 mg by mouth daily.   gabapentin (NEURONTIN) 300 MG capsule Take 600 mg by mouth at bedtime. (Patient not taking: Reported on 02/20/2023)   glucose blood (ONETOUCH VERIO) test strip USE AS INSTRUCTED TO TEST BLOOD SUGAR FOUR TIMES DAILY   HYDROcodone-acetaminophen (NORCO) 5-325 MG tablet Take 1 tablet by mouth every 6 (six) hours as needed.   insulin degludec (TRESIBA FLEXTOUCH) 200 UNIT/ML FlexTouch Pen Inject 40 Units into the skin at bedtime.   Insulin Pen Needle (B-D UF III MINI PEN NEEDLES) 31G X 5 MM MISC USE AS DIRECTED WITH INSULIN INJECTIONS QHS   Lancets Thin MISC 1 each by Does not apply route 4 (four) times daily. Use to test blood glucose 4 times daily   lisinopril (PRINIVIL,ZESTRIL) 40 MG tablet  Take 40 mg by mouth daily.   loratadine (CLARITIN) 10 MG tablet Take 10 mg by mouth daily.   metoprolol succinate (TOPROL-XL) 25 MG 24 hr tablet Take 25 mg by mouth daily.   pantoprazole (PROTONIX) 40 MG tablet Take 1 tablet (40 mg total) by mouth daily.   potassium chloride (K-DUR) 10 MEQ tablet Take 10 mEq by mouth daily as needed (Take only whe take Torsemide).    tirzepatide (MOUNJARO) 15 MG/0.5ML Pen Inject 15 mg into the skin once a week.   torsemide (DEMADEX) 20 MG tablet Take 20 mg by mouth daily as needed (Excess fluid).   [DISCONTINUED] Continuous Blood Gluc Receiver (FREESTYLE LIBRE 2 READER) DEVI USE TO CHECK GLUCOSE AS DIRECTED   [DISCONTINUED] Continuous Glucose Sensor (FREESTYLE LIBRE 2 SENSOR) MISC USE AND CHANGE 1 SENSOR EVERY 14 DAYS   No facility-administered encounter medications on file as of 04/19/2023.    ALLERGIES: No Known Allergies  VACCINATION STATUS:  There is no immunization history on file for this patient.  Diabetes She presents for her follow-up diabetic visit. She has type 2 diabetes mellitus. Onset time: She was diagnosed at approximate age of 57 years. Her disease course has been improving. There are no hypoglycemic associated symptoms. Pertinent negatives for hypoglycemia include no confusion, headaches, pallor or seizures. Pertinent negatives for diabetes include no chest pain, no fatigue and no polyphagia. There are no hypoglycemic complications. Symptoms are improving. There are no diabetic complications. Risk factors for coronary artery disease include diabetes mellitus, dyslipidemia, family history, obesity, hypertension and sedentary lifestyle. Current diabetic treatments: She is taking toujeo 50-75 units twice a day,jardiance 25 mg by mouth daily. Her weight is fluctuating minimally (She is status post sleeve gastrectomy, lost 60pounds since last visit.). She is following a generally unhealthy diet. When asked about meal planning, she reported none. She  has not had a previous visit with a dietitian. She rarely participates in exercise. Her home blood glucose trend is decreasing steadily. Her breakfast blood glucose range is generally >200 mg/dl. Her lunch blood glucose range is generally >200 mg/dl. Her dinner blood glucose range is generally >200 mg/dl. Her bedtime blood glucose range is generally >200 mg/dl. Her overall blood glucose range is >200 mg/dl. (Ms. Edgecombe presents with her CGM device showing improved glycemic profile, with point-of-care A1c of 7.1%.  However, her most recent 2 weeks average blood glucose 219 mg per DL.  Her AGP report is showing 24% time range, 48% level 1 hyperglycemia, 28% level 2 hyperglycemia.  She has no hypoglycemia.   ) An ACE inhibitor/angiotensin II receptor blocker is being taken. Eye exam is current.  Hypertension This is a chronic problem. The current episode started  more than 1 year ago. The problem is controlled. Pertinent negatives include no chest pain, headaches, palpitations or shortness of breath. Risk factors for coronary artery disease include diabetes mellitus, obesity, sedentary lifestyle, family history and dyslipidemia. Past treatments include ACE inhibitors.    Review of systems Lost 45 pounds since her bariatric surgery.  Objective:    BP (!) 150/86 Comment: R arm with manuel cuff  Pulse 72   Ht 5\' 6"  (1.676 m)   Wt 269 lb 3.2 oz (122.1 kg)   LMP 11/01/2015   BMI 43.45 kg/m   Wt Readings from Last 3 Encounters:  04/19/23 269 lb 3.2 oz (122.1 kg)  04/03/23 257 lb (116.6 kg)  02/20/23 272 lb 6.4 oz (123.6 kg)      CMP     Component Value Date/Time   NA 136 04/03/2023 1202   NA 143 11/30/2022 0807   K 3.7 04/03/2023 1202   CL 106 04/03/2023 1202   CO2 23 04/03/2023 1202   GLUCOSE 185 (H) 04/03/2023 1202   BUN 10 04/03/2023 1202   BUN 15 11/30/2022 0807   CREATININE 0.68 04/03/2023 1202   CREATININE 0.67 07/08/2019 0710   CALCIUM 8.9 04/03/2023 1202   PROT 6.6 04/03/2023  1202   PROT 5.8 (L) 11/30/2022 0807   ALBUMIN 3.5 04/03/2023 1202   ALBUMIN 3.8 11/30/2022 0807   AST 16 04/03/2023 1202   ALT 13 04/03/2023 1202   ALKPHOS 73 04/03/2023 1202   BILITOT 0.6 04/03/2023 1202   BILITOT 0.4 11/30/2022 0807   GFRNONAA >60 04/03/2023 1202   GFRNONAA 100 07/08/2019 0710   GFRAA >60 05/11/2020 1812   GFRAA 116 07/08/2019 0710    Diabetic Labs (most recent): Lab Results  Component Value Date   HGBA1C 7.1 (A) 04/19/2023   HGBA1C 7.4 (A) 12/07/2022   HGBA1C 6.9 08/01/2022   MICROALBUR 0.8 09/26/2018   MICROALBUR 2.2 09/08/2017   Lipid Panel     Component Value Date/Time   CHOL 116 11/30/2022 0807   TRIG 106 11/30/2022 0807   HDL 48 11/30/2022 0807   CHOLHDL 2.4 11/30/2022 0807   CHOLHDL 2.6 09/26/2018 0733   VLDL 44 (H) 07/17/2013 0701   LDLCALC 48 11/30/2022 0807   LDLCALC 73 09/26/2018 0733    Assessment & Plan:   1. Uncontrolled type 2 diabetes mellitus with hyperglycemia (HCC) - Patient has currently uncontrolled symptomatic type 2 DM since  57 years of age.  Ms. Sabel presents with her CGM device showing improved glycemic profile, with point-of-care A1c of 7.1%.  However, her most recent 2 weeks average blood glucose 219 mg per DL.  Her AGP report is showing 24% time range, 48% level 1 hyperglycemia, 28% level 2 hyperglycemia.  She has no hypoglycemia.     -Recent labs reviewed.  -her diabetes is complicated by obesity/sedentary life and SABRINE KITZMAN remains at a high risk for more acute and chronic complications which include CAD, CVA, CKD, retinopathy, and neuropathy. These are all discussed in detail with the patient.  - I have counseled her on diet management and weight loss, by adopting a carbohydrate restricted/protein rich diet.  - she acknowledges that there is a room for improvement in her food and drink choices. - Suggestion is made for her to avoid simple carbohydrates  from her diet including Cakes, Sweet Desserts, Ice  Cream, Soda (diet and regular), Sweet Tea, Candies, Chips, Cookies, Store Bought Juices, Alcohol , Artificial Sweeteners,  Coffee Creamer, and "Sugar-free" Products, Lemonade. This  will help patient to have more stable blood glucose profile and potentially avoid unintended weight gain.  The following Lifestyle Medicine recommendations according to American College of Lifestyle Medicine  [facility]) were discussed and and offered to patient and she  agrees to start the journey:  A. Whole Foods, Plant-Based Nutrition comprising of fruits and vegetables, plant-based proteins, whole-grain carbohydrates was discussed in detail with the patient.   A list for source of those nutrients were also provided to the patient.  Patient will use only water or unsweetened tea for hydration. B.  The need to stay away from risky substances including alcohol, smoking; obtaining 7 to 9 hours of restorative sleep, at least 150 minutes of moderate intensity exercise weekly, the importance of healthy social connections,  and stress management techniques were discussed. C.  A full color page of  Calorie density of various food groups per pound showing examples of each food groups was provided to the patient.   - I have approached her with the following individualized plan to manage diabetes and patient agrees:   -   Based on her current glycemic profile she will not need prandial insulin for now.  She is advised to continue her Tresiba 40 units nightly, continue Mounjaro 15 units subcutaneously weekly.  She will continue to use her CGM and report glycemic profile if they are below 70 or above 150 mg per DL at fasting.   - Patient specific target  A1c;  LDL, HDL, Triglycerides, were discussed in detail.  2) BP/HTN: -Her blood pressure is not controlled to target.  She is advised to continue her current medications including lisinopril 40 mg p.o. daily at breakfast.    3) Lipids/HPL: Her recent lipid panel showed controlled LDL  improved at 48.  She is currently on Lipitor 20 mg p.o. nightly.  She is advised to continue.  4)  Weight/Diet: Her current BMI is increasing to 43.45-clearly complicating her diabetes care.  She is a candidate for modest weight loss.    -- Whole food plant-based diet, exercise, and detailed carbohydrates information provided. -She is status post sleeve gastrectomy, previously Roux-en-Y gastric bypass.  5) hypervitaminosis B12 and D: She is advised to finish her current supplies of vitamin D2 50,000 units weekly and not refill.   -She is taking oral vitamin B12, labs showing supraphysiologic levels of B12.  She is advised to discontinue vitamin B12 supplements until next measurement.    6) Chronic Care/Health Maintenance:  -she  is on ACEI/ARB  medications and  is encouraged to continue to follow up with Ophthalmology, Dentist,  Podiatrist at least yearly or according to recommendations, and advised to  stay away from smoking. I have recommended yearly flu vaccine and pneumonia vaccination at least every 5 years; moderate intensity exercise for up to 150 minutes weekly; and  sleep for at least 7 hours a day.  - I advised patient to maintain close follow up with Richardean Chimera, MD for primary care needs.    I spent  41  minutes in the care of the patient today including review of labs from CMP, Lipids, Thyroid Function, Hematology (current and previous including abstractions from other facilities); face-to-face time discussing  her blood glucose readings/logs, discussing hypoglycemia and hyperglycemia episodes and symptoms, medications doses, her options of short and long term treatment based on the latest standards of care / guidelines;  discussion about incorporating lifestyle medicine;  and documenting the encounter. Risk reduction counseling performed per USPSTF guidelines to  reduce  obesity and cardiovascular risk factors.     Please refer to Patient Instructions for Blood Glucose Monitoring  and Insulin/Medications Dosing Guide"  in media tab for additional information. Please  also refer to " Patient Self Inventory" in the Media  tab for reviewed elements of pertinent patient history.  Nicholes Calamity participated in the discussions, expressed understanding, and voiced agreement with the above plans.  All questions were answered to her satisfaction. she is encouraged to contact clinic should she have any questions or concerns prior to her return visit.   Follow up plan: - Return in about 4 months (around 08/19/2023) for Bring Meter/CGM Device/Logs- A1c in Office.  Marquis Lunch, MD Phone: [phone]  Fax: [phone]   04/19/2023, 7:50 PM This note was partially dictated with voice recognition software. Similar sounding words can be transcribed inadequately or may not  be corrected upon review.

## 2023-04-19 NOTE — Patient Instructions (Signed)

## 2023-04-27 ENCOUNTER — Encounter: Payer: Self-pay | Admitting: Internal Medicine

## 2023-04-27 ENCOUNTER — Ambulatory Visit (INDEPENDENT_AMBULATORY_CARE_PROVIDER_SITE_OTHER): Payer: Commercial Managed Care - PPO | Admitting: Internal Medicine

## 2023-04-27 VITALS — BP 160/78 | HR 67 | Temp 97.7°F | Ht 66.0 in | Wt 268.6 lb

## 2023-04-27 DIAGNOSIS — R195 Other fecal abnormalities: Secondary | ICD-10-CM

## 2023-04-27 DIAGNOSIS — K219 Gastro-esophageal reflux disease without esophagitis: Secondary | ICD-10-CM | POA: Diagnosis not present

## 2023-04-27 NOTE — Progress Notes (Signed)
Primary Care Physician:  Richardean Chimera, MD Primary Gastroenterologist:  Dr. Marletta Lor  Chief Complaint  Patient presents with   New Patient (Initial Visit)    Pt referred for pos cologuard    HPI:   [name] [name] is a 57 y.o. female who presents to the clinic today due to recent positive Cologuard testing.  She has a history of ileitis in the past, questionable small bowel Crohn's disease.  Was on Pentasa for 10 months though no longer taking this medication.  She states she has not had a flare of her Crohn's in many years.  Last colonoscopy 2012.  Denies any family history of colorectal malignancy.  No melena hematochezia.  No abdominal pain.  No unintentional weight loss.  No mucus in her stool.  Does have chronic GERD which is well-controlled on pantoprazole daily.  No dysphagia odynophagia, no epigastric or chest pain.    Past Medical History:  Diagnosis Date   Crohn's colitis (HCC)    Diverticulosis OCT 2011    PAN-COLONIC   DM (diabetes mellitus) (HCC)    Dyspnea    On exertion   HTN (hypertension)    Hyperlipemia    Ileitis, terminal (HCC) JUL 2012 CE: TI ULCER   ASCA 4.3 NL pANCA 7.4   Obesity, Class III, BMI 40-49.9 (morbid obesity) (HCC) AUG 2010 305 LBS   MAY 2012 267   Vitamin D deficiency     Past Surgical History:  Procedure Laterality Date   ABDOMINAL EXPLORATION SURGERY  May 05, 2009 MM   INFLAMMED TI (SEROSITIS), "DIDN'T LOOK LIKE CROHN'S"   CHOLECYSTECTOMY     COLONOSCOPY  DEC 2010 WO   COLONOSCOPY  OCT 2011   MILD ERYTHEMA IN TI (30-40 CM), NL SB/COLON Bx   ESOPHAGOGASTRODUODENOSCOPY N/A 04/14/2014   Procedure: ESOPHAGOGASTRODUODENOSCOPY (EGD);  Surgeon: West Bali, MD;  [facility];  Service: Endoscopy;  Laterality: N/A;  8:30 AM   ESOPHAGOGASTRODUODENOSCOPY N/A 05/11/2020   Procedure: UPPER GASTROINTESTINAL ENDOSCOPY;  Surgeon: Luretha Murphy, MD;  Location: [facility];  Service: General;  Laterality: N/A;   LAPAROSCOPIC GASTRIC  BANDING  AUG 2010 BMI 50   GASTRIC POUCH 2-3 CM   LAPAROSCOPIC GASTRIC SLEEVE RESECTION N/A 05/11/2020   Procedure: LAPAROSCOPIC SLEEVE GASTRECTOMY;  Surgeon: Luretha Murphy, MD;  Location: [facility];  Service: General;  Laterality: N/A;   UMBILICAL HERNIA REPAIR  AUG 2010 MATTHEW MARTIN   INCARCERATED OMENTUM   UPPER GASTROINTESTINAL ENDOSCOPY  MAY 2012 GIVENS CAPSULE PLACEMENT   GASTRITIS    Current Outpatient Medications  Medication Sig Dispense Refill   AMLODIPINE BENZOATE PO Take 5 mg by mouth daily.     atorvastatin (LIPITOR) 10 MG tablet Take 10 mg by mouth daily.     Continuous Glucose Receiver (FREESTYLE LIBRE 3 READER) DEVI 1 Piece by Does not apply route once as needed for up to 1 dose. 1 each 0   Continuous Glucose Sensor (FREESTYLE LIBRE 3 SENSOR) MISC 1 Piece by Does not apply route every 14 (fourteen) days. Place 1 sensor on the skin every 14 days. Use to check glucose continuously 2 each 2   DULoxetine (CYMBALTA) 30 MG capsule Take 30 mg by mouth daily.     gabapentin (NEURONTIN) 300 MG capsule Take 600 mg by mouth at bedtime.     glucose blood (ONETOUCH VERIO) test strip USE AS INSTRUCTED TO TEST BLOOD SUGAR FOUR TIMES DAILY 200 strip 1   insulin degludec (TRESIBA FLEXTOUCH) 200 UNIT/ML FlexTouch  Pen Inject 40 Units into the skin at bedtime. 18 mL 1   Insulin Pen Needle (B-D UF III MINI PEN NEEDLES) 31G X 5 MM MISC USE AS DIRECTED WITH INSULIN INJECTIONS QHS 100 each 1   Lancets Thin MISC 1 each by Does not apply route 4 (four) times daily. Use to test blood glucose 4 times daily 200 each 1   lisinopril (PRINIVIL,ZESTRIL) 40 MG tablet Take 40 mg by mouth daily.     loratadine (CLARITIN) 10 MG tablet Take 10 mg by mouth daily.     meloxicam (MOBIC) 15 MG tablet Take 15 mg by mouth daily.     metoprolol succinate (TOPROL-XL) 25 MG 24 hr tablet Take 25 mg by mouth daily.     pantoprazole (PROTONIX) 40 MG tablet Take 1 tablet (40 mg total) by mouth daily. 90 tablet 0   potassium  chloride (K-DUR) 10 MEQ tablet Take 10 mEq by mouth daily as needed (Take only whe take Torsemide).      tirzepatide (MOUNJARO) 15 MG/0.5ML Pen Inject 15 mg into the skin once a week.     torsemide (DEMADEX) 20 MG tablet Take 20 mg by mouth daily as needed (Excess fluid).     HYDROcodone-acetaminophen (NORCO) 5-325 MG tablet Take 1 tablet by mouth every 6 (six) hours as needed. (Patient not taking: Reported on 04/27/2023) 4 tablet 0   No current facility-administered medications for this visit.    Allergies as of 04/27/2023   (No Known Allergies)    Family History  Problem Relation Age of Onset   Diabetes Mother    Diabetes Father    Diabetes Other    Colon cancer Neg Hx    Colon polyps Neg Hx    Stomach cancer Neg Hx     Social History   Socioeconomic History   Marital status: Divorced    Spouse name: Not on file   Number of children: Not on file   Years of education: Not on file   Highest education level: Not on file  Occupational History   Not on file  Tobacco Use   Smoking status: Never   Smokeless tobacco: Never   Tobacco comments:    Never smoked  Vaping Use   Vaping status: Never Used  Substance and Sexual Activity   Alcohol use: No   Drug use: No   Sexual activity: Not on file  Other Topics Concern   Not on file  Social History Narrative   MARRIED, NO KIDS   Social Determinants of Health   Financial Resource Strain: Not on file  Food Insecurity: Not on file  Transportation Needs: Not on file  Physical Activity: Not on file  Stress: Not on file  Social Connections: Not on file  Intimate Partner Violence: Not on file    Subjective: Review of Systems  Constitutional:  Negative for chills and fever.  HENT:  Negative for congestion and hearing loss.   Eyes:  Negative for blurred vision and double vision.  Respiratory:  Negative for cough and shortness of breath.   Cardiovascular:  Negative for chest pain and palpitations.  Gastrointestinal:  Positive  for heartburn. Negative for abdominal pain, blood in stool, constipation, diarrhea, melena and vomiting.  Genitourinary:  Negative for dysuria and urgency.  Musculoskeletal:  Negative for joint pain and myalgias.  Skin:  Negative for itching and rash.  Neurological:  Negative for dizziness and headaches.  Psychiatric/Behavioral:  Negative for depression. The patient is not nervous/anxious.  Objective: BP (!) 160/78   Pulse 67   Temp 97.7 F (36.5 C)   Ht 5\' 6"  (1.676 m)   Wt 268 lb 9.6 oz (121.8 kg)   LMP 11/01/2015   BMI 43.35 kg/m  Physical Exam Constitutional:      Appearance: Normal appearance. She is obese.  HENT:     Head: Normocephalic and atraumatic.  Eyes:     Extraocular Movements: Extraocular movements intact.     Conjunctiva/sclera: Conjunctivae normal.  Cardiovascular:     Rate and Rhythm: Normal rate and regular rhythm.  Pulmonary:     Effort: Pulmonary effort is normal.     Breath sounds: Normal breath sounds.  Abdominal:     General: Bowel sounds are normal.     Palpations: Abdomen is soft.  Musculoskeletal:        General: No swelling. Normal range of motion.     Cervical back: Normal range of motion and neck supple.  Skin:    General: Skin is warm and dry.     Coloration: Skin is not jaundiced.  Neurological:     General: No focal deficit present.     Mental Status: She is alert and oriented to person, place, and time.  Psychiatric:        Mood and Affect: Mood normal.        Behavior: Behavior normal.      Assessment: *Chronic GERD-well-controlled on pantoprazole daily *Positive Cologuard testing *Ileitis  Plan: Chronic GERD well-controlled on pantoprazole daily.  No alarm symptoms today to warrant further investigation with upper endoscopy.  In her recent positive Cologuard test, will schedule for screening colonoscopy.The risks including infection, bleed, or perforation as well as benefits, limitations, alternatives and  imponderables have been reviewed with the patient. Questions have been answered. All parties agreeable.  Will intubate her terminal ileum to evaluate given her history of ileitis prior.  Further recommendations to follow  04/27/2023 3:44 PM   Disclaimer: This note was dictated with voice recognition software. Similar sounding words can inadvertently be transcribed and may not be corrected upon review.

## 2023-04-27 NOTE — Patient Instructions (Signed)
We will schedule you for colonoscopy given your recent positive Cologuard testing.  I will also evaluate you for history of Crohn's disease.  Continue on pantoprazole for your chronic reflux.  It was very nice meeting you today.  Dr. Marletta Lor

## 2023-04-28 ENCOUNTER — Encounter: Payer: Self-pay | Admitting: Internal Medicine

## 2023-05-03 ENCOUNTER — Encounter: Payer: Self-pay | Admitting: *Deleted

## 2023-05-03 ENCOUNTER — Other Ambulatory Visit: Payer: Self-pay | Admitting: *Deleted

## 2023-05-03 MED ORDER — PEG 3350-KCL-NA BICARB-NACL 420 G PO SOLR: 4000.0000 mL | Freq: Once | ORAL | 0 refills | Status: AC

## 2023-05-04 ENCOUNTER — Encounter: Payer: Self-pay | Admitting: *Deleted

## 2023-05-09 ENCOUNTER — Other Ambulatory Visit: Payer: Self-pay | Admitting: "Endocrinology

## 2023-05-09 DIAGNOSIS — E1165 Type 2 diabetes mellitus with hyperglycemia: Secondary | ICD-10-CM

## 2023-05-17 ENCOUNTER — Telehealth: Payer: Self-pay

## 2023-05-17 NOTE — Telephone Encounter (Signed)
Left a message requesting pt return call to the office. 

## 2023-05-18 ENCOUNTER — Telehealth: Payer: Self-pay

## 2023-05-18 NOTE — Telephone Encounter (Signed)
Pt called with high BG readings.   Date Before breakfast Before lunch Before supper Bedtime  05/16/23 [phone]  05/17/23 [phone]  05/18/23 275             Pt taking: Tresiba 40 units at bedtime, Mounjaro 15mg  weekly.   Pt had steroid injections in both knees on 8/13

## 2023-05-19 NOTE — Telephone Encounter (Signed)
Left a message requesting pt return call to the office. 

## 2023-06-06 ENCOUNTER — Encounter (HOSPITAL_COMMUNITY): Payer: Self-pay

## 2023-06-06 ENCOUNTER — Encounter (HOSPITAL_COMMUNITY)
Admission: RE | Admit: 2023-06-06 | Discharge: 2023-06-06 | Disposition: A | Discharge status: Home / Self Care | Payer: Commercial Managed Care - PPO | Source: Ambulatory Visit | Attending: Internal Medicine | Admitting: Internal Medicine

## 2023-06-09 ENCOUNTER — Other Ambulatory Visit: Payer: Self-pay

## 2023-06-09 ENCOUNTER — Ambulatory Visit (HOSPITAL_COMMUNITY)
Admission: RE | Admit: 2023-06-09 | Discharge: 2023-06-09 | Disposition: A | Discharge status: Home / Self Care | Payer: Commercial Managed Care - PPO | Attending: Internal Medicine | Admitting: Internal Medicine

## 2023-06-09 ENCOUNTER — Ambulatory Visit (HOSPITAL_COMMUNITY): Payer: Commercial Managed Care - PPO | Admitting: Anesthesiology

## 2023-06-09 ENCOUNTER — Encounter (HOSPITAL_COMMUNITY): Payer: Self-pay

## 2023-06-09 ENCOUNTER — Encounter (HOSPITAL_COMMUNITY)
Admission: RE | Disposition: A | Discharge status: Home / Self Care | Payer: Self-pay | Source: Home / Self Care | Attending: Internal Medicine

## 2023-06-09 DIAGNOSIS — E119 Type 2 diabetes mellitus without complications: Secondary | ICD-10-CM | POA: Insufficient documentation

## 2023-06-09 DIAGNOSIS — K649 Unspecified hemorrhoids: Secondary | ICD-10-CM | POA: Diagnosis not present

## 2023-06-09 DIAGNOSIS — I10 Essential (primary) hypertension: Secondary | ICD-10-CM | POA: Insufficient documentation

## 2023-06-09 DIAGNOSIS — Z794 Long term (current) use of insulin: Secondary | ICD-10-CM

## 2023-06-09 DIAGNOSIS — K648 Other hemorrhoids: Secondary | ICD-10-CM | POA: Diagnosis not present

## 2023-06-09 DIAGNOSIS — R195 Other fecal abnormalities: Secondary | ICD-10-CM | POA: Insufficient documentation

## 2023-06-09 DIAGNOSIS — Z6841 Body Mass Index (BMI) 40.0 and over, adult: Secondary | ICD-10-CM | POA: Diagnosis not present

## 2023-06-09 DIAGNOSIS — K5730 Diverticulosis of large intestine without perforation or abscess without bleeding: Secondary | ICD-10-CM | POA: Diagnosis not present

## 2023-06-09 DIAGNOSIS — K5010 Crohn's disease of large intestine without complications: Secondary | ICD-10-CM | POA: Insufficient documentation

## 2023-06-09 DIAGNOSIS — Z1211 Encounter for screening for malignant neoplasm of colon: Secondary | ICD-10-CM | POA: Diagnosis not present

## 2023-06-09 HISTORY — PX: COLONOSCOPY WITH PROPOFOL: SHX5780

## 2023-06-09 LAB — GLUCOSE, CAPILLARY
Glucose-Capillary: 73 mg/dL (ref 70–99)
Glucose-Capillary: 93 mg/dL (ref 70–99)

## 2023-06-09 SURGERY — COLONOSCOPY WITH PROPOFOL
Anesthesia: General

## 2023-06-09 MED ORDER — DEXTROSE 50 % IV SOLN: 25.0000 mL | Freq: Once | INTRAVENOUS | Status: AC

## 2023-06-09 MED ORDER — PROPOFOL 10 MG/ML IV BOLUS
INTRAVENOUS | Status: DC | PRN
  Administered 2023-06-09: 100 mg via INTRAVENOUS

## 2023-06-09 MED ORDER — STERILE WATER FOR IRRIGATION IR SOLN
Status: DC | PRN
  Administered 2023-06-09: 60 mL
  Administered 2023-06-09: 120 mL

## 2023-06-09 MED ORDER — LIDOCAINE HCL (CARDIAC) PF 100 MG/5ML IV SOSY
PREFILLED_SYRINGE | INTRAVENOUS | Status: DC | PRN
  Administered 2023-06-09: 80 mg via INTRAVENOUS

## 2023-06-09 MED ORDER — DEXTROSE 50 % IV SOLN
INTRAVENOUS | Status: AC
  Administered 2023-06-09: 25 mL via INTRAVENOUS
  Filled 2023-06-09: qty 50

## 2023-06-09 MED ORDER — PROPOFOL 500 MG/50ML IV EMUL
INTRAVENOUS | Status: AC
  Filled 2023-06-09: qty 50

## 2023-06-09 MED ORDER — LACTATED RINGERS IV SOLN: INTRAVENOUS | Status: DC

## 2023-06-09 MED ORDER — LIDOCAINE HCL (PF) 2 % IJ SOLN
INTRAMUSCULAR | Status: AC
  Filled 2023-06-09: qty 10

## 2023-06-09 MED ORDER — PROPOFOL 500 MG/50ML IV EMUL
INTRAVENOUS | Status: DC | PRN
  Administered 2023-06-09: 150 ug/kg/min via INTRAVENOUS

## 2023-06-09 NOTE — Discharge Instructions (Signed)
  Colonoscopy Discharge Instructions  Read the instructions outlined below and refer to this sheet in the next few weeks. These discharge instructions provide you with general information on caring for yourself after you leave the hospital. Your doctor may also give you specific instructions. While your treatment has been planned according to the most current medical practices available, unavoidable complications occasionally occur.   ACTIVITY You may resume your regular activity, but move at a slower pace for the next 24 hours.  Take frequent rest periods for the next 24 hours.  Walking will help get rid of the air and reduce the bloated feeling in your belly (abdomen).  No driving for 24 hours (because of the medicine (anesthesia) used during the test).   Do not sign any important legal documents or operate any machinery for 24 hours (because of the anesthesia used during the test).  NUTRITION Drink plenty of fluids.  You may resume your normal diet as instructed by your doctor.  Begin with a light meal and progress to your normal diet. Heavy or fried foods are harder to digest and may make you feel sick to your stomach (nauseated).  Avoid alcoholic beverages for 24 hours or as instructed.  MEDICATIONS You may resume your normal medications unless your doctor tells you otherwise.  WHAT YOU CAN EXPECT TODAY Some feelings of bloating in the abdomen.  Passage of more gas than usual.  Spotting of blood in your stool or on the toilet paper.  IF YOU HAD POLYPS REMOVED DURING THE COLONOSCOPY: No aspirin products for 7 days or as instructed.  No alcohol for 7 days or as instructed.  Eat a soft diet for the next 24 hours.  FINDING OUT THE RESULTS OF YOUR TEST Not all test results are available during your visit. If your test results are not back during the visit, make an appointment with your caregiver to find out the results. Do not assume everything is normal if you have not heard from your  caregiver or the medical facility. It is important for you to follow up on all of your test results.  SEEK IMMEDIATE MEDICAL ATTENTION IF: You have more than a spotting of blood in your stool.  Your belly is swollen (abdominal distention).  You are nauseated or vomiting.  You have a temperature over 101.  You have abdominal pain or discomfort that is severe or gets worse throughout the day.   Your colonoscopy was relatively unremarkable.  I did not find any polyps or evidence of colon cancer.  I recommend repeating colonoscopy in 10 years for colon cancer screening purposes.    You do have diverticulosis and internal hemorrhoids. I would recommend increasing fiber in your diet or adding OTC Benefiber/Metamucil. Be sure to drink at least 4 to 6 glasses of water daily. Follow-up with GI as needed.  End portion of your small bowel appeared healthy, no evidence of Crohn's.  I hope you have a great rest of your week!  Hennie Duos. Marletta Lor, D.O. Gastroenterology and Hepatology [facility]

## 2023-06-09 NOTE — Anesthesia Preprocedure Evaluation (Signed)
Anesthesia Evaluation  Patient identified by MRN, date of birth, ID band Patient awake    Reviewed: Allergy & Precautions, H&P , NPO status , Patient's Chart, lab work & pertinent test results, reviewed documented beta blocker date and time   Airway Mallampati: II  TM Distance: >3 FB Neck ROM: full    Dental no notable dental hx.    Pulmonary neg pulmonary ROS   Pulmonary exam normal breath sounds clear to auscultation       Cardiovascular Exercise Tolerance: Good hypertension, negative cardio ROS  Rhythm:regular Rate:Normal     Neuro/Psych negative neurological ROS  negative psych ROS   GI/Hepatic negative GI ROS, Neg liver ROS,,,  Endo/Other  diabetes  Morbid obesity  Renal/GU negative Renal ROS  negative genitourinary   Musculoskeletal   Abdominal   Peds  Hematology negative hematology ROS (+)   Anesthesia Other Findings   Reproductive/Obstetrics negative OB ROS                             Anesthesia Physical Anesthesia Plan  ASA: 3  Anesthesia Plan: General   Post-op Pain Management:    Induction:   PONV Risk Score and Plan: Propofol infusion  Airway Management Planned:   Additional Equipment:   Intra-op Plan:   Post-operative Plan:   Informed Consent: I have reviewed the patients History and Physical, chart, labs and discussed the procedure including the risks, benefits and alternatives for the proposed anesthesia with the patient or authorized representative who has indicated his/her understanding and acceptance.     Dental Advisory Given  Plan Discussed with: CRNA  Anesthesia Plan Comments:        Anesthesia Quick Evaluation

## 2023-06-09 NOTE — H&P (Signed)
Primary Care Physician:  Richardean Chimera, MD Primary Gastroenterologist:  Dr. Marletta Lor  Pre-Procedure History & Physical: HPI:  [name] [name] is a 57 y.o. female is here  for a colonoscopy to be performed for colon cancer screening purposes/positive Cologuard testing  Past Medical History:  Diagnosis Date   Crohn's colitis (HCC)    Diverticulosis OCT 2011    PAN-COLONIC   DM (diabetes mellitus) (HCC)    Dyspnea    On exertion   HTN (hypertension)    Hyperlipemia    Ileitis, terminal (HCC) JUL 2012 CE: TI ULCER   ASCA 4.3 NL pANCA 7.4   Obesity, Class III, BMI 40-49.9 (morbid obesity) (HCC) AUG 2010 305 LBS   MAY 2012 267   Vitamin D deficiency     Past Surgical History:  Procedure Laterality Date   ABDOMINAL EXPLORATION SURGERY  May 05, 2009 MM   INFLAMMED TI (SEROSITIS), "DIDN'T LOOK LIKE CROHN'S"   CHOLECYSTECTOMY     COLONOSCOPY  DEC 2010 WO   COLONOSCOPY  OCT 2011   MILD ERYTHEMA IN TI (30-40 CM), NL SB/COLON Bx   ESOPHAGOGASTRODUODENOSCOPY N/A 04/14/2014   Procedure: ESOPHAGOGASTRODUODENOSCOPY (EGD);  Surgeon: West Bali, MD;  Location: [facility];  Service: Endoscopy;  Laterality: N/A;  8:30 AM   ESOPHAGOGASTRODUODENOSCOPY N/A 05/11/2020   Procedure: UPPER GASTROINTESTINAL ENDOSCOPY;  Surgeon: Luretha Murphy, MD;  Location: [facility];  Service: General;  Laterality: N/A;   LAPAROSCOPIC GASTRIC BANDING  AUG 2010 BMI 50   GASTRIC POUCH 2-3 CM   LAPAROSCOPIC GASTRIC SLEEVE RESECTION N/A 05/11/2020   Procedure: LAPAROSCOPIC SLEEVE GASTRECTOMY;  Surgeon: Luretha Murphy, MD;  Location: [facility];  Service: General;  Laterality: N/A;   UMBILICAL HERNIA REPAIR  AUG 2010 MATTHEW MARTIN   INCARCERATED OMENTUM   UPPER GASTROINTESTINAL ENDOSCOPY  MAY 2012 GIVENS CAPSULE PLACEMENT   GASTRITIS    Prior to Admission medications   Medication Sig Start Date End Date Taking? Authorizing Provider  AMLODIPINE BENZOATE PO Take 5 mg by mouth daily.   Yes [provider]   atorvastatin (LIPITOR) 10 MG tablet Take 10 mg by mouth daily. 04/18/20  Yes [provider]  DULoxetine (CYMBALTA) 30 MG capsule Take 30 mg by mouth daily. 04/14/20  Yes [provider]  gabapentin (NEURONTIN) 300 MG capsule Take 600 mg by mouth at bedtime. 02/18/20  Yes [provider]  insulin degludec (TRESIBA FLEXTOUCH) 200 UNIT/ML FlexTouch Pen ADMINISTER 40 UNITS UNDER THE SKIN AT BEDTIME 05/09/23  Yes Nida, Denman George, MD  Insulin Pen Needle (B-D UF III MINI PEN NEEDLES) 31G X 5 MM MISC USE AS DIRECTED WITH INSULIN INJECTIONS QHS 08/18/21  Yes Dani Gobble, NP  Lancets Thin MISC 1 each by Does not apply route 4 (four) times daily. Use to test blood glucose 4 times daily 11/25/21  Yes Nida, Denman George, MD  lisinopril (PRINIVIL,ZESTRIL) 40 MG tablet Take 40 mg by mouth daily.   Yes [provider]  loratadine (CLARITIN) 10 MG tablet Take 10 mg by mouth daily.   Yes [provider]  meloxicam (MOBIC) 15 MG tablet Take 15 mg by mouth daily. 04/01/23  Yes [provider]  metoprolol succinate (TOPROL-XL) 25 MG 24 hr tablet Take 25 mg by mouth daily. 04/01/20  Yes [provider]  pantoprazole (PROTONIX) 40 MG tablet Take 1 tablet (40 mg total) by mouth daily. 05/13/20  Yes Luretha Murphy, MD  potassium chloride (K-DUR) 10 MEQ tablet Take 10 mEq by mouth  daily as needed (Take only whe take Torsemide).    Yes [provider]  torsemide (DEMADEX) 20 MG tablet Take 20 mg by mouth daily as needed (Excess fluid).   Yes [provider]  Continuous Glucose Receiver (FREESTYLE LIBRE 3 READER) DEVI 1 Piece by Does not apply route once as needed for up to 1 dose. 04/19/23   Roma Kayser, MD  Continuous Glucose Sensor (FREESTYLE LIBRE 3 SENSOR) MISC 1 Piece by Does not apply route every 14 (fourteen) days. Place 1 sensor on the skin every 14 days. Use to check glucose continuously 04/19/23   Roma Kayser,  MD  glucose blood (ONETOUCH VERIO) test strip USE AS INSTRUCTED TO TEST BLOOD SUGAR FOUR TIMES DAILY 03/03/20   Roma Kayser, MD  HYDROcodone-acetaminophen (NORCO) 5-325 MG tablet Take 1 tablet by mouth every 6 (six) hours as needed. Patient not taking: Reported on 04/27/2023 04/03/23   Carmel Sacramento A, PA-C  tirzepatide [facility]) 15 MG/0.5ML Pen Inject 15 mg into the skin once a week. 06/28/22   [provider]    Allergies as of 05/03/2023   (No Known Allergies)    Family History  Problem Relation Age of Onset   Diabetes Mother    Diabetes Father    Diabetes Other    Colon cancer Neg Hx    Colon polyps Neg Hx    Stomach cancer Neg Hx     Social History   Socioeconomic History   Marital status: Divorced    Spouse name: Not on file   Number of children: Not on file   Years of education: Not on file   Highest education level: Not on file  Occupational History   Not on file  Tobacco Use   Smoking status: Never   Smokeless tobacco: Never   Tobacco comments:    Never smoked  Vaping Use   Vaping status: Never Used  Substance and Sexual Activity   Alcohol use: No   Drug use: No   Sexual activity: Not on file  Other Topics Concern   Not on file  Social History Narrative   MARRIED, NO KIDS   Social Determinants of Health   Financial Resource Strain: Not on file  Food Insecurity: Not on file  Transportation Needs: Not on file  Physical Activity: Inactive (05/26/2023)   Received [facility]   Exercise Vital Sign    Days of Exercise per Week: 0 days    Minutes of Exercise per Session: 0 min  Stress: No Stress Concern Present (05/26/2023)   Received from [facility] of Occupational Health - Occupational Stress Questionnaire    Feeling of Stress : Not at all  Social Connections: Not on file  Intimate Partner Violence: Not At Risk (05/26/2023)   Received from [facility]   Humiliation, Afraid, Rape, and Kick  questionnaire    Fear of Current or Ex-Partner: No    Emotionally Abused: No    Physically Abused: No    Sexually Abused: No    Review of Systems: See HPI, otherwise negative ROS  Physical Exam: Vital signs in last 24 hours: Temp:  [98.9 F (37.2 C)] 98.9 F (37.2 C) (09/20 0909) Pulse Rate:  [68] 68 (09/20 0909) Resp:  [14] 14 (09/20 0909) BP: (161)/(84) 161/84 (09/20 0909) SpO2:  [100 %] 100 % (09/20 0909) Weight:  [115.7 kg] 115.7 kg (09/20 0909)   General:   Alert,  Well-developed, well-nourished, pleasant  and cooperative in NAD Head:  Normocephalic and atraumatic. Eyes:  Sclera clear, no icterus.   Conjunctiva pink. Ears:  Normal auditory acuity. Nose:  No deformity, discharge,  or lesions. Msk:  Symmetrical without gross deformities. Normal posture. Extremities:  Without clubbing or edema. Neurologic:  Alert and  oriented x4;  grossly normal neurologically. Skin:  Intact without significant lesions or rashes. Psych:  Alert and cooperative. Normal mood and affect.  Impression/Plan: [name] [name] is here for a colonoscopy to be performed for colon cancer screening purposes/positive Cologuard testing  The risks of the procedure including infection, bleed, or perforation as well as benefits, limitations, alternatives and imponderables have been reviewed with the patient. Questions have been answered. All parties agreeable.

## 2023-06-09 NOTE — Op Note (Signed)
[facility] Patient Name: [name] [name] Procedure Date: 06/09/2023 11:03 AM MRN: [id number] Date of Birth: 1966-05-08 Attending MD: Hennie Duos. Marletta Lor , [place], [id number] CSN: [id number] Age: 57 Admit Type: Outpatient Procedure:                Colonoscopy Indications:              Screening for colorectal malignant neoplasm,                            Incidental - Positive Cologuard test Providers:                Hennie Duos. Marletta Lor, DO, Nena Polio, RN, Lennice Sites                            Technician, Technician Referring MD:             Hennie Duos. Marletta Lor, DO Medicines:                See the Anesthesia note for documentation of the                            administered medications Complications:            No immediate complications. Estimated Blood Loss:     Estimated blood loss: none. Procedure:                Pre-Anesthesia Assessment:                           - The anesthesia plan was to use monitored                            anesthesia care (MAC).                           After obtaining informed consent, the colonoscope                            was passed under direct vision. Throughout the                            procedure, the patient's blood pressure, pulse, and                            oxygen saturations were monitored continuously. The                            [id number]-HQ190L ([id number]) scope was introduced through                            the anus and advanced to the the terminal ileum,                            with identification of the appendiceal orifice and                            IC valve. The  colonoscopy was performed without                            difficulty. The patient tolerated the procedure                            well. The quality of the bowel preparation was                            evaluated using the BBPS [facility] Bowel Preparation                            Scale) with scores of: Right Colon = 3, Transverse                             Colon = 3 and Left Colon = 3 (entire mucosa seen                            well with no residual staining, small fragments of                            stool or opaque liquid). The total BBPS score                            equals 9. Scope In: 11:18:36 AM Scope Out: 11:37:18 AM Scope Withdrawal Time: 0 hours 15 minutes 47 seconds  Total Procedure Duration: 0 hours 18 minutes 42 seconds  Findings:      Non-bleeding internal hemorrhoids were found during retroflexion.      A few medium-mouthed and small-mouthed diverticula were found in the       transverse colon.      The terminal ileum appeared normal. Impression:               - Non-bleeding internal hemorrhoids.                           - Diverticulosis in the transverse colon.                           - The examined portion of the ileum was normal.                           - No specimens collected. Moderate Sedation:      Per Anesthesia Care Recommendation:           - Patient has a contact number available for                            emergencies. The signs and symptoms of potential                            delayed complications were discussed with the                            patient. Return to normal activities tomorrow.  Written discharge instructions were provided to the                            patient.                           - Resume previous diet.                           - Continue present medications.                           - Repeat colonoscopy in 10 years for screening                            purposes.                           - Return to GI clinic PRN. Procedure Code(s):        --- Professional ---                           W0981, Colorectal cancer screening; colonoscopy on                            individual not meeting criteria for high risk Diagnosis Code(s):        --- Professional ---                           Z12.11, Encounter for screening for malignant                             neoplasm of colon                           K64.8, Other hemorrhoids                           K57.30, Diverticulosis of large intestine without                            perforation or abscess without bleeding CPT copyright 2022 American Medical Association. All rights reserved. The codes documented in this report are preliminary and upon coder review may  be revised to meet current compliance requirements. Hennie Duos. Marletta Lor, DO Hennie Duos. Marletta Lor, DO 06/09/2023 11:40:02 AM This report has been signed electronically. Number of Addenda: 0

## 2023-06-09 NOTE — Transfer of Care (Addendum)
Immediate Anesthesia Transfer of Care Note  Patient: [name] [name]  Procedure(s) Performed: COLONOSCOPY WITH PROPOFOL  Patient Location: Short Stay  Anesthesia Type:General  Level of Consciousness: awake and patient cooperative  Airway & Oxygen Therapy: Patient Spontanous Breathing and Patient connected to face mask oxygen  Post-op Assessment: Report given to RN and Post -op Vital signs reviewed and stable  Post vital signs: Reviewed and stable  Last Vitals:  Vitals Value Taken Time  BP 121/103 06/09/23   1145  Temp 37.2 06/09/23   1145  Pulse 77 06/09/23   1145  Resp 15 06/09/23   1145  SpO2 100% 06/09/23   1145    Last Pain:  Vitals:   06/09/23 0909  TempSrc: Oral  PainSc: 0-No pain         Complications: No notable events documented.

## 2023-06-10 NOTE — Anesthesia Postprocedure Evaluation (Signed)
Anesthesia Post Note  Patient: [name] [name]  Procedure(s) Performed: COLONOSCOPY WITH PROPOFOL  Patient location during evaluation: Phase II Anesthesia Type: General Level of consciousness: awake Pain management: pain level controlled Vital Signs Assessment: post-procedure vital signs reviewed and stable Respiratory status: spontaneous breathing and respiratory function stable Cardiovascular status: blood pressure returned to baseline and stable Postop Assessment: no headache and no apparent nausea or vomiting Anesthetic complications: no Comments: Late entry   No notable events documented.   Last Vitals:  Vitals:   06/09/23 1145 06/09/23 1149  BP: (!) 121/103 (!) 158/81  Pulse: 77 72  Resp: 15 16  Temp: 37.2 C   SpO2: 100% 100%    Last Pain:  Vitals:   06/09/23 1149  TempSrc:   PainSc: 0-No pain                 Windell Norfolk

## 2023-06-19 ENCOUNTER — Encounter (HOSPITAL_COMMUNITY): Payer: Self-pay | Admitting: Internal Medicine

## 2023-07-10 ENCOUNTER — Other Ambulatory Visit: Payer: Self-pay

## 2023-07-10 MED ORDER — FREESTYLE LIBRE 3 PLUS SENSOR MISC: 1 refills | Status: DC

## 2023-07-13 ENCOUNTER — Other Ambulatory Visit: Payer: Self-pay | Admitting: "Endocrinology

## 2023-07-13 ENCOUNTER — Other Ambulatory Visit: Payer: Self-pay

## 2023-07-13 DIAGNOSIS — E1165 Type 2 diabetes mellitus with hyperglycemia: Secondary | ICD-10-CM

## 2023-07-13 MED ORDER — TRESIBA FLEXTOUCH 200 UNIT/ML ~~LOC~~ SOPN: 60.0000 [IU] | PEN_INJECTOR | Freq: Every day | SUBCUTANEOUS | 2 refills | Status: DC

## 2023-08-30 ENCOUNTER — Ambulatory Visit (INDEPENDENT_AMBULATORY_CARE_PROVIDER_SITE_OTHER): Payer: Commercial Managed Care - PPO | Admitting: "Endocrinology

## 2023-08-30 ENCOUNTER — Encounter: Payer: Self-pay | Admitting: "Endocrinology

## 2023-08-30 VITALS — BP 138/86 | HR 76 | Ht 66.0 in | Wt 268.4 lb

## 2023-08-30 DIAGNOSIS — E1165 Type 2 diabetes mellitus with hyperglycemia: Secondary | ICD-10-CM | POA: Diagnosis not present

## 2023-08-30 DIAGNOSIS — Z794 Long term (current) use of insulin: Secondary | ICD-10-CM | POA: Diagnosis not present

## 2023-08-30 DIAGNOSIS — I10 Essential (primary) hypertension: Secondary | ICD-10-CM | POA: Diagnosis not present

## 2023-08-30 DIAGNOSIS — E782 Mixed hyperlipidemia: Secondary | ICD-10-CM

## 2023-08-30 LAB — POCT GLYCOSYLATED HEMOGLOBIN (HGB A1C): HbA1c, POC (controlled diabetic range): 6.5 % (ref 0.0–7.0)

## 2023-08-30 NOTE — Progress Notes (Signed)
08/30/2023  Endocrinology follow-up note   Subjective:    Patient ID: [name] [name], female    DOB: 12/17/1965.  she is being seen in follow-up for the  management of currently uncontrolled symptomatic diabetes requested by  Richardean Chimera, MD.   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   ASCA 4.3 NL pANCA 7.4   Obesity, Class III, BMI 40-49.9 (morbid obesity) (HCC) AUG 2010 305 LBS   MAY 2012 267   Vitamin D deficiency    Past Surgical History:  Procedure Laterality Date   ABDOMINAL EXPLORATION SURGERY  May 05, 2009 MM   INFLAMMED TI (SEROSITIS), "DIDN'T LOOK LIKE CROHN'S"   CHOLECYSTECTOMY     COLONOSCOPY  DEC 2010 WO   COLONOSCOPY  OCT 2011   MILD ERYTHEMA IN TI (30-40 CM), NL SB/COLON Bx   COLONOSCOPY WITH PROPOFOL N/A 06/09/2023   Procedure: COLONOSCOPY WITH PROPOFOL;  Surgeon: Lanelle Bal, DO;  [facility];  Service: Endoscopy;  Laterality: N/A;  10:45 am, asa 3   ESOPHAGOGASTRODUODENOSCOPY N/A 04/14/2014   Procedure: ESOPHAGOGASTRODUODENOSCOPY (EGD);  Surgeon: West Bali, MD;  [facility];  Service: Endoscopy;  Laterality: N/A;  8:30 AM   ESOPHAGOGASTRODUODENOSCOPY N/A 05/11/2020   Procedure: UPPER GASTROINTESTINAL ENDOSCOPY;  Surgeon: Luretha Murphy, MD;  Location: [facility];  Service: General;  Laterality: N/A;   LAPAROSCOPIC GASTRIC BANDING  AUG 2010 BMI 50   GASTRIC POUCH 2-3 CM   LAPAROSCOPIC GASTRIC SLEEVE RESECTION N/A 05/11/2020   Procedure: LAPAROSCOPIC SLEEVE GASTRECTOMY;  Surgeon: Luretha Murphy, MD;  Location: [facility];  Service: General;  Laterality: N/A;   UMBILICAL HERNIA REPAIR  AUG 2010 MATTHEW MARTIN   INCARCERATED OMENTUM   UPPER GASTROINTESTINAL ENDOSCOPY  MAY 2012 GIVENS CAPSULE PLACEMENT   GASTRITIS    Social History   Socioeconomic History   Marital status: Divorced    Spouse name: Not on file   Number of children: Not on file   Years of education: Not on file   Highest education level: Not on file  Occupational History   Not on file  Tobacco Use   Smoking status: Never   Smokeless tobacco: Never   Tobacco comments:    Never smoked  Vaping Use   Vaping status: Never Used  Substance and Sexual Activity   Alcohol use: No   Drug use: No   Sexual activity: Not on file  Other Topics Concern   Not on file  Social History Narrative   MARRIED, NO KIDS   Social Determinants of Health   Financial Resource Strain: Not on file  Food Insecurity: Not on file  Transportation Needs: Not on file  Physical Activity: Inactive (05/26/2023)   [facility]   Exercise Vital Sign    Days of Exercise per Week: 0 days    Minutes of Exercise per Session: 0 min  Stress: No Stress Concern Present (05/26/2023)   Received from [facility] - Occupational Stress Questionnaire    Feeling of Stress : Not at all  Social Connections: Not on file   Outpatient Encounter Medications as of 08/30/2023  Medication Sig   AMLODIPINE BENZOATE PO  Take 5 mg by mouth daily.   atorvastatin (LIPITOR) 10 MG tablet Take 10 mg by mouth daily.   Continuous Glucose Receiver (FREESTYLE LIBRE 3 READER) DEVI 1 Piece by Does not apply route once as needed for up to 1 dose.   Continuous Glucose Sensor (FREESTYLE LIBRE 3 PLUS SENSOR) MISC Change sensor every 15 days. E11.65   DULoxetine (CYMBALTA) 30 MG capsule Take 30 mg by mouth daily.   gabapentin (NEURONTIN) 300 MG capsule Take 600 mg by mouth at bedtime.   glucose blood (ONETOUCH VERIO) test strip USE AS INSTRUCTED TO TEST BLOOD SUGAR FOUR TIMES DAILY   insulin degludec (TRESIBA FLEXTOUCH) 200 UNIT/ML FlexTouch Pen Inject 60 Units into the skin at bedtime. (Patient taking differently: Inject 40 Units into  the skin at bedtime.)   Insulin Pen Needle (B-D UF III MINI PEN NEEDLES) 31G X 5 MM MISC USE AS DIRECTED WITH INSULIN INJECTIONS QHS   Lancets Thin MISC 1 each by Does not apply route 4 (four) times daily. Use to test blood glucose 4 times daily   lisinopril (PRINIVIL,ZESTRIL) 40 MG tablet Take 40 mg by mouth daily.   loratadine (CLARITIN) 10 MG tablet Take 10 mg by mouth daily.   meloxicam (MOBIC) 15 MG tablet Take 15 mg by mouth daily.   metoprolol succinate (TOPROL-XL) 25 MG 24 hr tablet Take 25 mg by mouth daily.   pantoprazole (PROTONIX) 40 MG tablet Take 1 tablet (40 mg total) by mouth daily.   potassium chloride (K-DUR) 10 MEQ tablet Take 10 mEq by mouth daily as needed (Take only whe take Torsemide).    tirzepatide (MOUNJARO) 15 MG/0.5ML Pen Inject 15 mg into the skin once a week.   torsemide (DEMADEX) 20 MG tablet Take 20 mg by mouth daily as needed (Excess fluid).   [DISCONTINUED] HYDROcodone-acetaminophen (NORCO) 5-325 MG tablet Take 1 tablet by mouth every 6 (six) hours as needed. (Patient not taking: Reported on 04/27/2023)   No facility-administered encounter medications on file as of 08/30/2023.    ALLERGIES: No Known Allergies  VACCINATION STATUS:  There is no immunization history on file for this patient.  Diabetes She presents for her follow-up diabetic visit. She has type 2 diabetes mellitus. Onset time: She was diagnosed at approximate age of 30 years. Her disease course has been improving. There are no hypoglycemic associated symptoms. Pertinent negatives for hypoglycemia include no confusion, headaches, pallor or seizures. Pertinent negatives for diabetes include no chest pain, no fatigue and no polyphagia. There are no hypoglycemic complications. Symptoms are improving. There are no diabetic complications. Risk factors for coronary artery disease include diabetes mellitus, dyslipidemia, family history, obesity, hypertension and sedentary lifestyle. Current diabetic  treatments: She is taking toujeo 50-75 units twice a day,jardiance 25 mg by mouth daily. Her weight is increasing steadily (She is status post sleeve gastrectomy, lost 60pounds since last visit.). She is following a generally unhealthy diet. When asked about meal planning, she reported none. She has not had a previous visit with a dietitian. She rarely participates in exercise. Her home blood glucose trend is decreasing steadily. Her breakfast blood glucose range is generally 140-180 mg/dl. Her lunch blood glucose range is generally 140-180 mg/dl. Her dinner blood glucose range is generally 140-180 mg/dl. Her bedtime blood glucose range is generally 140-180 mg/dl. Her overall blood glucose range is 140-180 mg/dl. (Ms. Shortridge presents with her CGM device showing significant improvement in her glycemic profile.  Her AGP report shows 74% time range, 21% level 1 hyperglycemia,  4% level 2 hyperglycemia.  She has no significant hypoglycemia.  Her point-of-care A1c is 6.5% improving from 7.1%.     ) An ACE inhibitor/angiotensin II receptor blocker is being taken. Eye exam is current.  Hypertension This is a chronic problem. The current episode started more than 1 year ago. The problem is controlled. Pertinent negatives include no chest pain, headaches, palpitations or shortness of breath. Risk factors for coronary artery disease include diabetes mellitus, obesity, sedentary lifestyle, family history and dyslipidemia. Past treatments include ACE inhibitors.    Review of systems Lost 45 pounds since her bariatric surgery.  Objective:    BP 138/86   Pulse 76   Ht 5\' 6"  (1.676 m)   Wt 268 lb 6.4 oz (121.7 kg)   LMP 11/01/2015   BMI 43.32 kg/m   Wt Readings from Last 3 Encounters:  08/30/23 268 lb 6.4 oz (121.7 kg)  06/09/23 255 lb (115.7 kg)  06/06/23 255 lb (115.7 kg)      CMP     Component Value Date/Time   NA 136 04/03/2023 1202   NA 143 11/30/2022 0807   K 3.7 04/03/2023 1202   CL 106  04/03/2023 1202   CO2 23 04/03/2023 1202   GLUCOSE 185 (H) 04/03/2023 1202   BUN 10 04/03/2023 1202   BUN 15 11/30/2022 0807   CREATININE 0.68 04/03/2023 1202   CREATININE 0.67 07/08/2019 0710   CALCIUM 8.9 04/03/2023 1202   PROT 6.6 04/03/2023 1202   PROT 5.8 (L) 11/30/2022 0807   ALBUMIN 3.5 04/03/2023 1202   ALBUMIN 3.8 11/30/2022 0807   AST 16 04/03/2023 1202   ALT 13 04/03/2023 1202   ALKPHOS 73 04/03/2023 1202   BILITOT 0.6 04/03/2023 1202   BILITOT 0.4 11/30/2022 0807   GFRNONAA >60 04/03/2023 1202   GFRNONAA 100 07/08/2019 0710   GFRAA >60 05/11/2020 1812   GFRAA 116 07/08/2019 0710    Diabetic Labs (most recent): Lab Results  Component Value Date   HGBA1C 6.5 08/30/2023   HGBA1C 7.1 (A) 04/19/2023   HGBA1C 7.4 (A) 12/07/2022   MICROALBUR 0.8 09/26/2018   MICROALBUR 2.2 09/08/2017   Lipid Panel     Component Value Date/Time   CHOL 116 11/30/2022 0807   TRIG 106 11/30/2022 0807   HDL 48 11/30/2022 0807   CHOLHDL 2.4 11/30/2022 0807   CHOLHDL 2.6 09/26/2018 0733   VLDL 44 (H) 07/17/2013 0701   LDLCALC 48 11/30/2022 0807   LDLCALC 73 09/26/2018 0733    Assessment & Plan:   1. Uncontrolled type 2 diabetes mellitus with hyperglycemia (HCC) - Patient has currently uncontrolled symptomatic type 2 DM since  57 years of age.  Ms. Porche presents with her CGM device showing significant improvement in her glycemic profile.  Her AGP report shows 74% time range, 21% level 1 hyperglycemia, 4% level 2 hyperglycemia.  She has no significant hypoglycemia.  Her point-of-care A1c is 6.5% improving from 7.1%.      -Recent labs reviewed.  -her diabetes is complicated by obesity/sedentary life and ANADELIA MAXHAM remains at a high risk for more acute and chronic complications which include CAD, CVA, CKD, retinopathy, and neuropathy. These are all discussed in detail with the patient.  - I have counseled her on diet management and weight loss, by adopting a carbohydrate  restricted/protein rich diet.  - she acknowledges that there is a room for improvement in her food and drink choices. - Suggestion is made for her to avoid simple carbohydrates  from her diet including Cakes, Sweet Desserts, Ice Cream, Soda (diet and regular), Sweet Tea, Candies, Chips, Cookies, Store Bought Juices, Alcohol , Artificial Sweeteners,  Coffee Creamer, and "Sugar-free" Products, Lemonade. This will help patient to have more stable blood glucose profile and potentially avoid unintended weight gain.  The following Lifestyle Medicine recommendations according to American College of Lifestyle Medicine  [facility]) were discussed and and offered to patient and she  agrees to start the journey:  A. Whole Foods, Plant-Based Nutrition comprising of fruits and vegetables, plant-based proteins, whole-grain carbohydrates was discussed in detail with the patient.   A list for source of those nutrients were also provided to the patient.  Patient will use only water or unsweetened tea for hydration. B.  The need to stay away from risky substances including alcohol, smoking; obtaining 7 to 9 hours of restorative sleep, at least 150 minutes of moderate intensity exercise weekly, the importance of healthy social connections,  and stress management techniques were discussed. C.  A full color page of  Calorie density of various food groups per pound showing examples of each food groups was provided to the patient.   - I have approached her with the following individualized plan to manage diabetes and patient agrees:   -   Based on her current glycemic profile, she will not need prandial insulin for now.  She is advised to continue Tresiba 40 units nightly, continue Mounjaro 15 mg subcutaneously weekly.  Side effects and precautions discussed with her.     - Patient specific target  A1c;  LDL, HDL, Triglycerides, were discussed in detail.  2) BP/HTN: -Her blood pressure is controlled to target.  She is  advised to continue her current medications including lisinopril 40 mg p.o. daily at breakfast.    3) Lipids/HPL: Her recent lipid panel showed controlled LDL improved at 48.  She currently on Lipitor 20 mg p.o. nightly.  She is advised to continue.     4)  Weight/Diet: Her current BMI is increasing to 43.32--clearly complicating her diabetes care.  She is a candidate for modest weight loss.    -- Whole food plant-based diet, exercise, and detailed carbohydrates information provided, as well as Mounjaro at 15 mg subcutaneously weekly. -She is status post sleeve gastrectomy, previously Roux-en-Y gastric bypass.  5) hypervitaminosis B12 and D: She is advised to finish her current supplies of vitamin D2 50,000 units weekly and not refill.   -She is taking oral vitamin B12, labs showing supraphysiologic levels of B12.  She is advised to discontinue vitamin B12 supplements until next measurement.    6) Chronic Care/Health Maintenance:  -she  is on ACEI/ARB  medications and  is encouraged to continue to follow up with Ophthalmology, Dentist,  Podiatrist at least yearly or according to recommendations, and advised to  stay away from smoking. I have recommended yearly flu vaccine and pneumonia vaccination at least every 5 years; moderate intensity exercise for up to 150 minutes weekly; and  sleep for at least 7 hours a day.  - I advised patient to maintain close follow up with Richardean Chimera, MD for primary care needs.     I spent  41  minutes in the care of the patient today including review of labs from CMP, Lipids, Thyroid Function, Hematology (current and previous including abstractions from other facilities); face-to-face time discussing  her blood glucose readings/logs, discussing hypoglycemia and hyperglycemia episodes and symptoms, medications doses, her options of short and long term treatment  based on the latest standards of care / guidelines;  discussion about incorporating lifestyle  medicine;  and documenting the encounter. Risk reduction counseling performed per USPSTF guidelines to reduce obesity and cardiovascular risk factors.     Please refer to Patient Instructions for Blood Glucose Monitoring and Insulin/Medications Dosing Guide"  in media tab for additional information. Please  also refer to " Patient Self Inventory" in the Media  tab for reviewed elements of pertinent patient history.  Nicholes Calamity participated in the discussions, expressed understanding, and voiced agreement with the above plans.  All questions were answered to her satisfaction. she is encouraged to contact clinic should she have any questions or concerns prior to her return visit.    Follow up plan: - Return in about 4 months (around 12/29/2023) for F/U with Pre-visit Labs, Meter/CGM/Logs, A1c here.  Marquis Lunch, MD Phone: [phone]  Fax: [phone]   08/30/2023, 4:27 PM This note was partially dictated with voice recognition software. Similar sounding words can be transcribed inadequately or may not  be corrected upon review.

## 2023-08-30 NOTE — Patient Instructions (Signed)

## 2023-09-08 NOTE — Progress Notes (Deleted)
Office Visit Note  Patient: [name] [name]             Date of Birth: 12/12/1965           MRN: [id number]             PCP: Richardean Chimera, MD Referring: Richardean Chimera, MD Visit Date: 09/22/2023 Occupation: @GUAROCC @  Subjective:  No chief complaint on file.   History of Present Illness: [name] [name] is a 57 y.o. female ***     Activities of Daily Living:  Patient reports morning stiffness for *** {minute/hour:19697}.   Patient {ACTIONS;DENIES/REPORTS:21021675::"Denies"} nocturnal pain.  Difficulty dressing/grooming: {ACTIONS;DENIES/REPORTS:21021675::"Denies"} Difficulty climbing stairs: {ACTIONS;DENIES/REPORTS:21021675::"Denies"} Difficulty getting out of chair: {ACTIONS;DENIES/REPORTS:21021675::"Denies"} Difficulty using hands for taps, buttons, cutlery, and/or writing: {ACTIONS;DENIES/REPORTS:21021675::"Denies"}  No Rheumatology ROS completed.   PMFS History:  Patient Active Problem List   Diagnosis Date Noted   Insulin long-term use (HCC) 04/19/2023   Hypervitaminosis 07/28/2021   Statin declined 08/25/2020   S/P laparoscopic sleeve gastrectomy 05/11/2020   Vitamin D deficiency 09/14/2017   Uncontrolled type 2 diabetes mellitus with hyperglycemia (HCC) 06/20/2017   Essential hypertension, benign 06/20/2017   Hypokalemia 07/18/2013   Nausea vomiting and diarrhea 07/17/2013   Abdominal pain 07/17/2013   Enteritis 07/17/2013   Hyponatremia 07/17/2013   Leukocytosis 07/17/2013   Hyperglycemia 07/17/2013   Tachycardia 07/17/2013   DM (diabetes mellitus) (HCC)    HTN (hypertension)    Hyperlipemia    Morbid obesity (HCC)    Ileitis, 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    Past Medical History:  Diagnosis Date   Crohn's colitis (HCC)    Diverticulosis OCT 2011    PAN-COLONIC   DM (diabetes mellitus) (HCC)    Dyspnea    On exertion   HTN (hypertension)    Hyperlipemia    Ileitis, terminal (HCC) JUL 2012 CE: TI ULCER   ASCA 4.3 NL pANCA 7.4   Obesity, Class  III, BMI 40-49.9 (morbid obesity) (HCC) AUG 2010 305 LBS   MAY 2012 267   Vitamin D deficiency     Family History  Problem Relation Age of Onset   Diabetes Mother    Diabetes Father    Diabetes Other    Colon cancer Neg Hx    Colon polyps Neg Hx    Stomach cancer Neg Hx    Past Surgical History:  Procedure Laterality Date   ABDOMINAL EXPLORATION SURGERY  May 05, 2009 MM   INFLAMMED TI (SEROSITIS), "DIDN'T LOOK LIKE CROHN'S"   CHOLECYSTECTOMY     COLONOSCOPY  DEC 2010 WO   COLONOSCOPY  OCT 2011   MILD ERYTHEMA IN TI (30-40 CM), NL SB/COLON Bx   COLONOSCOPY WITH PROPOFOL N/A 06/09/2023   Procedure: COLONOSCOPY WITH PROPOFOL;  Surgeon: Lanelle Bal, DO;  [facility];  Service: Endoscopy;  Laterality: N/A;  10:45 am, asa 3   ESOPHAGOGASTRODUODENOSCOPY N/A 04/14/2014   Procedure: ESOPHAGOGASTRODUODENOSCOPY (EGD);  Surgeon: West Bali, MD;  [facility];  Service: Endoscopy;  Laterality: N/A;  8:30 AM   ESOPHAGOGASTRODUODENOSCOPY N/A 05/11/2020   Procedure: UPPER GASTROINTESTINAL ENDOSCOPY;  Surgeon: Luretha Murphy, MD;  Location[facility];  Service: General;  Laterality: N/A;   LAPAROSCOPIC GASTRIC BANDING  AUG 2010 BMI 50   GASTRIC POUCH 2-3 CM   LAPAROSCOPIC GASTRIC SLEEVE RESECTION N/A 05/11/2020   Procedure: LAPAROSCOPIC SLEEVE GASTRECTOMY;  Surgeon: Luretha Murphy, MD;  [facility];  Service: General;  Laterality: N/A;   UMBILICAL HERNIA REPAIR  AUG  2010 MATTHEW MARTIN   INCARCERATED OMENTUM   UPPER GASTROINTESTINAL ENDOSCOPY  MAY 2012 GIVENS CAPSULE PLACEMENT   GASTRITIS   Social History   Social History Narrative   MARRIED, NO KIDS    There is no immunization history on file for this patient.   Objective: Vital Signs: LMP 11/01/2015    Physical Exam   Musculoskeletal Exam: ***  CDAI Exam: CDAI Score: -- Patient Global: --; Provider Global: -- Swollen: --; Tender: -- Joint Exam 09/22/2023   No joint exam has been documented  for this visit   There is currently no information documented on the homunculus. Go to the Rheumatology activity and complete the homunculus joint exam.  Investigation: No additional findings.  Imaging: No results found.  Recent Labs: Lab Results  Component Value Date   WBC 9.1 04/03/2023   HGB 12.4 04/03/2023   PLT 222 04/03/2023   NA 136 04/03/2023   K 3.7 04/03/2023   CL 106 04/03/2023   CO2 23 04/03/2023   GLUCOSE 185 (H) 04/03/2023   BUN 10 04/03/2023   CREATININE 0.68 04/03/2023   BILITOT 0.6 04/03/2023   ALKPHOS 73 04/03/2023   AST 16 04/03/2023   ALT 13 04/03/2023   PROT 6.6 04/03/2023   ALBUMIN 3.5 04/03/2023   CALCIUM 8.9 04/03/2023   GFRAA >60 05/11/2020   March 15, 2023 sed rate 2, Lyme negative March 14, 2023 CBC WBC 8.1, hemoglobin 12.3, platelets 248, RF negative, uric acid 3.2, ANA negative, anti-CCP negative, Jan 30, 2023 hemoglobin A1c 7%  Speciality Comments: No specialty comments available.  Procedures:  No procedures performed Allergies: Patient has no known allergies.   Assessment / Plan:     Visit Diagnoses: No diagnosis found.  Orders: No orders of the defined types were placed in this encounter.  No orders of the defined types were placed in this encounter.   Face-to-face time spent with patient was *** minutes. Greater than 50% of time was spent in counseling and coordination of care.  Follow-Up Instructions: No follow-ups on file.   Pollyann Savoy, MD  Note - This record has been created using Animal nutritionist.  Chart creation errors have been sought, but may not always  have been located. Such creation errors do not reflect on  the standard of medical care.

## 2023-09-22 ENCOUNTER — Encounter: Payer: Commercial Managed Care - PPO | Admitting: Rheumatology

## 2023-09-22 DIAGNOSIS — M170 Bilateral primary osteoarthritis of knee: Secondary | ICD-10-CM

## 2023-09-22 DIAGNOSIS — E782 Mixed hyperlipidemia: Secondary | ICD-10-CM

## 2023-09-22 DIAGNOSIS — M1612 Unilateral primary osteoarthritis, left hip: Secondary | ICD-10-CM

## 2023-09-22 DIAGNOSIS — K5000 Crohn's disease of small intestine without complications: Secondary | ICD-10-CM

## 2023-09-22 DIAGNOSIS — E559 Vitamin D deficiency, unspecified: Secondary | ICD-10-CM

## 2023-09-22 DIAGNOSIS — Z794 Long term (current) use of insulin: Secondary | ICD-10-CM

## 2023-09-22 DIAGNOSIS — E1165 Type 2 diabetes mellitus with hyperglycemia: Secondary | ICD-10-CM

## 2023-09-22 DIAGNOSIS — M51369 Other intervertebral disc degeneration, lumbar region without mention of lumbar back pain or lower extremity pain: Secondary | ICD-10-CM

## 2023-09-22 DIAGNOSIS — I10 Essential (primary) hypertension: Secondary | ICD-10-CM

## 2023-09-22 DIAGNOSIS — Z9884 Bariatric surgery status: Secondary | ICD-10-CM

## 2023-10-23 ENCOUNTER — Ambulatory Visit: Payer: Commercial Managed Care - PPO | Admitting: Rheumatology

## 2023-11-01 ENCOUNTER — Telehealth: Payer: Self-pay | Admitting: "Endocrinology

## 2023-11-01 NOTE — Telephone Encounter (Signed)
Pt called with high BG readings.   Date Before breakfast Before lunch Before supper Bedtime  2/11    335  2/12 317                  Right now at 9:08 it is 388 Patient had 2 injections yesterday in her knees. Please Advise.

## 2023-11-01 NOTE — Telephone Encounter (Signed)
Left a message requesting pt return call to the office.

## 2023-11-02 NOTE — Telephone Encounter (Signed)
Spoke with pt, at time of call her glucose was 388, she is at work and does not have her insulin with her. Advised pt to increase her Tresiba to 50 units at bedtime beginning tonight per Dr. Isidoro Donning orders. Pt voiced understanding

## 2023-12-13 ENCOUNTER — Encounter (HOSPITAL_COMMUNITY): Payer: Self-pay | Admitting: *Deleted

## 2023-12-27 LAB — LIPID PANEL
Chol/HDL Ratio: 2.9 ratio (ref 0.0–4.4)
Cholesterol, Total: 140 mg/dL (ref 100–199)
HDL: 48 mg/dL (ref >39)
LDL Chol Calc (NIH): 63 mg/dL (ref 0–99)
Triglycerides: 172 mg/dL — ABNORMAL HIGH (ref 0–149)
VLDL Cholesterol Cal: 29 mg/dL (ref 5–40)

## 2023-12-27 LAB — COMPREHENSIVE METABOLIC PANEL WITH GFR
ALT: 16 IU/L (ref 0–32)
AST: 16 IU/L (ref 0–40)
Albumin: 3.8 g/dL (ref 3.8–4.9)
Alkaline Phosphatase: 105 IU/L (ref 44–121)
BUN/Creatinine Ratio: 22 (ref 9–23)
BUN: 16 mg/dL (ref 6–24)
Bilirubin Total: 0.3 mg/dL (ref 0.0–1.2)
CO2: 22 mmol/L (ref 20–29)
Calcium: 9.5 mg/dL (ref 8.7–10.2)
Chloride: 106 mmol/L (ref 96–106)
Creatinine, Ser: 0.74 mg/dL (ref 0.57–1.00)
Globulin, Total: 2.3 g/dL (ref 1.5–4.5)
Glucose: 148 mg/dL — ABNORMAL HIGH (ref 70–99)
Potassium: 5.2 mmol/L (ref 3.5–5.2)
Sodium: 142 mmol/L (ref 134–144)
Total Protein: 6.1 g/dL (ref 6.0–8.5)
eGFR: 94 mL/min/{1.73_m2} (ref >59)

## 2024-01-04 ENCOUNTER — Ambulatory Visit: Payer: Commercial Managed Care - PPO | Admitting: "Endocrinology

## 2024-02-01 ENCOUNTER — Ambulatory Visit (INDEPENDENT_AMBULATORY_CARE_PROVIDER_SITE_OTHER): Admitting: "Endocrinology

## 2024-02-01 ENCOUNTER — Encounter: Payer: Self-pay | Admitting: "Endocrinology

## 2024-02-01 VITALS — BP 120/84 | HR 68 | Ht 66.0 in | Wt 272.6 lb

## 2024-02-01 DIAGNOSIS — I10 Essential (primary) hypertension: Secondary | ICD-10-CM | POA: Diagnosis not present

## 2024-02-01 DIAGNOSIS — E782 Mixed hyperlipidemia: Secondary | ICD-10-CM | POA: Diagnosis not present

## 2024-02-01 DIAGNOSIS — E1165 Type 2 diabetes mellitus with hyperglycemia: Secondary | ICD-10-CM

## 2024-02-01 DIAGNOSIS — Z794 Long term (current) use of insulin: Secondary | ICD-10-CM

## 2024-02-01 LAB — POCT GLYCOSYLATED HEMOGLOBIN (HGB A1C): HbA1c, POC (controlled diabetic range): 7.1 % — AB (ref 0.0–7.0)

## 2024-02-01 MED ORDER — TRESIBA FLEXTOUCH 200 UNIT/ML ~~LOC~~ SOPN
50.0000 [IU] | PEN_INJECTOR | Freq: Every day | SUBCUTANEOUS | 2 refills | Status: DC
Start: 2024-02-01 — End: 2024-02-27

## 2024-02-01 NOTE — Patient Instructions (Signed)

## 2024-02-01 NOTE — Progress Notes (Signed)
 02/01/2024  Endocrinology follow-up note   Subjective:    Patient ID: [name] [name], female    DOB: 10/13/1965.  she is being seen in follow-up for the  management of currently uncontrolled symptomatic diabetes requested by  Leesa Pulling, MD.   Past Medical History:  Diagnosis Date   Crohn's colitis [facility])    Diverticulosis OCT 2011    PAN-COLONIC   DM (diabetes mellitus) (HCC)    Dyspnea    On exertion   HTN (hypertension)    Hyperlipemia    Ileitis, terminal (HCC) JUL 2012 CE: TI ULCER   ASCA 4.3 NL pANCA 7.4   Obesity, Class III, BMI 40-49.9 (morbid obesity) AUG 2010 305 LBS   MAY 2012 267   Vitamin D  deficiency    Past Surgical History:  Procedure Laterality Date   ABDOMINAL EXPLORATION SURGERY  May 05, 2009 MM   INFLAMMED TI (SEROSITIS), "DIDN'T LOOK LIKE CROHN'S"   CHOLECYSTECTOMY     COLONOSCOPY  DEC 2010 WO   COLONOSCOPY  OCT 2011   MILD ERYTHEMA IN TI (30-40 CM), NL SB/COLON Bx   COLONOSCOPY WITH PROPOFOL  N/A 06/09/2023   Procedure: COLONOSCOPY WITH PROPOFOL ;  Surgeon: Vinetta Greening, DO;  Location: [facility];  Service: Endoscopy;  Laterality: N/A;  10:45 am, asa 3   ESOPHAGOGASTRODUODENOSCOPY N/A 04/14/2014   Procedure: ESOPHAGOGASTRODUODENOSCOPY (EGD);  Surgeon: Alyce Jubilee, MD;  Location: [facility];  Service: Endoscopy;  Laterality: N/A;  8:30 AM   ESOPHAGOGASTRODUODENOSCOPY N/A 05/11/2020   Procedure: UPPER GASTROINTESTINAL ENDOSCOPY;  Surgeon: Jacolyn Matar, MD;  Location: [facility];  Service: General;  Laterality: N/A;   LAPAROSCOPIC GASTRIC BANDING  AUG 2010 BMI 50   GASTRIC POUCH 2-3 CM   LAPAROSCOPIC GASTRIC SLEEVE RESECTION N/A 05/11/2020   Procedure: LAPAROSCOPIC SLEEVE GASTRECTOMY;  Surgeon: Jacolyn Matar, MD;  Location: [facility];  Service: General;  Laterality: N/A;   UMBILICAL HERNIA REPAIR  AUG 2010 MATTHEW MARTIN   INCARCERATED OMENTUM   UPPER GASTROINTESTINAL ENDOSCOPY  MAY 2012 [facility] [employer] [employer]   GASTRITIS   Social  History   Socioeconomic History   Marital status: Divorced    Spouse name: Not on file   Number of children: Not on file   Years of education: Not on file   Highest education level: Not on file  Occupational History   Not on file  Tobacco Use   Smoking status: Never   Smokeless tobacco: Never   Tobacco comments:    Never smoked  Vaping Use   Vaping status: Never Used  Substance and Sexual Activity   Alcohol use: No   Drug use: No   Sexual activity: Not on file  Other Topics Concern   Not on file  Social History Narrative   MARRIED, NO KIDS   Social Drivers of Health   Financial Resource Strain: Not on file  Food Insecurity: Not on file  Transportation Needs: Not on file  Physical Activity: Inactive (05/26/2023)   [facility]   Exercise Vital Sign    Days of Exercise per Week: 0 days    Minutes of Exercise per Session: 0 min  Stress: No Stress Concern Present (05/26/2023)   Received from [facility] of Occupational Health - Occupational Stress Questionnaire    Feeling of Stress : Not at all  Social Connections: Not on file   Outpatient Encounter Medications as of 02/01/2024  Medication Sig   AMLODIPINE  BENZOATE PO Take  5 mg by mouth daily.   atorvastatin (LIPITOR) 10 MG tablet Take 10 mg by mouth daily.   Continuous Glucose Receiver (FREESTYLE LIBRE 3 READER) DEVI 1 Piece by Does not apply route once as needed for up to 1 dose.   Continuous Glucose Sensor (FREESTYLE LIBRE 3 PLUS SENSOR) MISC Change sensor every 15 days. E11.65   DULoxetine (CYMBALTA) 30 MG capsule Take 30 mg by mouth daily.   gabapentin  (NEURONTIN ) 300 MG capsule Take 600 mg by mouth at bedtime.   glucose blood (ONETOUCH VERIO) test strip USE AS INSTRUCTED TO TEST BLOOD SUGAR FOUR TIMES DAILY   insulin  degludec (TRESIBA  FLEXTOUCH) 200 UNIT/ML FlexTouch Pen Inject 50 Units into the skin at bedtime.   Insulin  Pen Needle (B-D UF III MINI PEN NEEDLES) 31G X 5 MM MISC  USE AS DIRECTED WITH INSULIN  INJECTIONS QHS   Lancets Thin MISC 1 each by Does not apply route 4 (four) times daily. Use to test blood glucose 4 times daily   lisinopril  (PRINIVIL ,ZESTRIL ) 40 MG tablet Take 40 mg by mouth daily.   loratadine (CLARITIN) 10 MG tablet Take 10 mg by mouth daily.   meloxicam (MOBIC) 15 MG tablet Take 15 mg by mouth daily.   metoprolol  succinate (TOPROL -XL) 25 MG 24 hr tablet Take 25 mg by mouth daily.   pantoprazole  (PROTONIX ) 40 MG tablet Take 1 tablet (40 mg total) by mouth daily.   potassium chloride  (K-DUR) 10 MEQ tablet Take 10 mEq by mouth daily as needed (Take only whe take Torsemide).    tirzepatide (MOUNJARO) 15 MG/0.5ML Pen Inject 15 mg into the skin once a week.   torsemide (DEMADEX) 20 MG tablet Take 20 mg by mouth daily as needed (Excess fluid).   [DISCONTINUED] insulin  degludec (TRESIBA  FLEXTOUCH) 200 UNIT/ML FlexTouch Pen Inject 60 Units into the skin at bedtime. (Patient taking differently: Inject 40 Units into the skin at bedtime.)   No facility-administered encounter medications on file as of 02/01/2024.    ALLERGIES: No Known Allergies  VACCINATION STATUS:  There is no immunization history on file for this patient.  Diabetes She presents for her follow-up diabetic visit. She has type 2 diabetes mellitus. Onset time: She was diagnosed at approximate age of 30 years. Her disease course has been worsening. There are no hypoglycemic associated symptoms. Pertinent negatives for hypoglycemia include no confusion, headaches, pallor or seizures. Pertinent negatives for diabetes include no chest pain, no fatigue and no polyphagia. There are no hypoglycemic complications. Symptoms are worsening. There are no diabetic complications. Risk factors for coronary artery disease include diabetes mellitus, dyslipidemia, family history, obesity, hypertension and sedentary lifestyle. Current diabetic treatments: She is taking toujeo 50-75 units twice a day,jardiance  25 mg by mouth daily. Her weight is increasing steadily (She is status post sleeve gastrectomy, lost 60pounds since last visit.). She is following a generally unhealthy diet. When asked about meal planning, she reported none. She has not had a previous visit with a dietitian. She rarely participates in exercise. Her home blood glucose trend is increasing steadily. Her breakfast blood glucose range is generally 140-180 mg/dl. Her lunch blood glucose range is generally 140-180 mg/dl. Her dinner blood glucose range is generally 140-180 mg/dl. Her bedtime blood glucose range is generally 140-180 mg/dl. Her overall blood glucose range is 140-180 mg/dl. (Ms. Bartz Zentz with her CGM device showing significantly above target glycemic profile averaging 172 mg per DL for the most recent 2 weeks.  Her point-of-care A1c 7.1% increasing from 6.5% during  her last visit.  Her AGP report shows 65% time range, 30% level 1 hyperglycemia, 5% level 2 hyperglycemia.  She has no hypoglycemia.  She reports that there has been exposure to steroids in the interval.    ) An ACE inhibitor/angiotensin II receptor blocker is being taken. Eye exam is current.  Hypertension This is a chronic problem. The current episode started more than 1 year ago. The problem is controlled. Pertinent negatives include no chest pain, headaches, palpitations or shortness of breath. Risk factors for coronary artery disease include diabetes mellitus, obesity, sedentary lifestyle, family history and dyslipidemia. Past treatments include ACE inhibitors.    Review of systems Lost 45 pounds since her bariatric surgery.  Objective:    BP 120/84   Pulse 68   Ht 5\' 6"  (1.676 m)   Wt 272 lb 9.6 oz (123.7 kg)   LMP 11/01/2015   BMI 44.00 kg/m   Wt Readings from Last 3 Encounters:  02/01/24 272 lb 9.6 oz (123.7 kg)  08/30/23 268 lb 6.4 oz (121.7 kg)  06/09/23 255 lb (115.7 kg)      CMP     Component Value Date/Time   NA 142 12/26/2023 0805    K 5.2 12/26/2023 0805   CL 106 12/26/2023 0805   CO2 22 12/26/2023 0805   GLUCOSE 148 (H) 12/26/2023 0805   GLUCOSE 185 (H) 04/03/2023 1202   BUN 16 12/26/2023 0805   CREATININE 0.74 12/26/2023 0805   CREATININE 0.67 07/08/2019 0710   CALCIUM 9.5 12/26/2023 0805   PROT 6.1 12/26/2023 0805   ALBUMIN  3.8 12/26/2023 0805   AST 16 12/26/2023 0805   ALT 16 12/26/2023 0805   ALKPHOS 105 12/26/2023 0805   BILITOT 0.3 12/26/2023 0805   GFRNONAA >60 04/03/2023 1202   GFRNONAA 100 07/08/2019 0710   GFRAA >60 05/11/2020 1812   GFRAA 116 07/08/2019 0710    Diabetic Labs (most recent): Lab Results  Component Value Date   HGBA1C 7.1 (A) 02/01/2024   HGBA1C 6.5 08/30/2023   HGBA1C 7.1 (A) 04/19/2023   MICROALBUR 0.8 09/26/2018   MICROALBUR 2.2 09/08/2017   Lipid Panel     Component Value Date/Time   CHOL 140 12/26/2023 0805   TRIG 172 (H) 12/26/2023 0805   HDL 48 12/26/2023 0805   CHOLHDL 2.9 12/26/2023 0805   CHOLHDL 2.6 09/26/2018 0733   VLDL 44 (H) 07/17/2013 0701   LDLCALC 63 12/26/2023 0805   LDLCALC 73 09/26/2018 0733    Assessment & Plan:   1. Uncontrolled type 2 diabetes mellitus with hyperglycemia (HCC) - Patient has currently uncontrolled symptomatic type 2 DM since  58 years of age.  Ms. Mans Zentz with her CGM device showing significantly above target glycemic profile averaging 172 mg per DL for the most recent 2 weeks.  Her point-of-care A1c 7.1% increasing from 6.5% during her last visit.  Her AGP report shows 65% time range, 30% level 1 hyperglycemia, 5% level 2 hyperglycemia.  She has no hypoglycemia.  She reports that there has been exposure to steroids in the interval.    -Recent labs reviewed.  -her diabetes is complicated by obesity/sedentary life and Erial R Perman remains at a high risk for more acute and chronic complications which include CAD, CVA, CKD, retinopathy, and neuropathy. These are all discussed in detail with the patient.  - I have  counseled her on diet management and weight loss, by adopting a carbohydrate restricted/protein rich diet.  - she acknowledges that there is a  room for improvement in her food and drink choices. - Suggestion is made for her to avoid simple carbohydrates  from her diet including Cakes, Sweet Desserts, Ice Cream, Soda (diet and regular), Sweet Tea, Candies, Chips, Cookies, Store Bought Juices, Alcohol , Artificial Sweeteners,  Coffee Creamer, and "Sugar-free" Products, Lemonade. This will help patient to have more stable blood glucose profile and potentially avoid unintended weight gain.  The following Lifestyle Medicine recommendations according to American College of Lifestyle Medicine  [facility]) were discussed and and offered to patient and she  agrees to start the journey:  A. Whole Foods, Plant-Based Nutrition comprising of fruits and vegetables, plant-based proteins, whole-grain carbohydrates was discussed in detail with the patient.   A list for source of those nutrients were also provided to the patient.  Patient will use only water  or unsweetened tea for hydration. B.  The need to stay away from risky substances including alcohol, smoking; obtaining 7 to 9 hours of restorative sleep, at least 150 minutes of moderate intensity exercise weekly, the importance of healthy social connections,  and stress management techniques were discussed. C.  A full color page of  Calorie density of various food groups per pound showing examples of each food groups was provided to the patient.   - I have approached her with the following individualized plan to manage diabetes and patient agrees:   -   Based on her current glycemic profile, she will not need prandial insulin  for now, however will need a higher dose of basal insulin .  I discussed and increased her Tresiba  to 50 units nightly, continue Mounjaro 15 mg subcutaneously weekly.   Side effects and precautions discussed with her.     - Patient specific  target  A1c;  LDL, HDL, Triglycerides, were discussed in detail.  2) BP/HTN: Her blood pressure is controlled to target.  She is advised to continue her current medications including lisinopril  40 mg p.o. daily at breakfast.    3) Lipids/HPL: Her recent lipid panel showed controlled LDL at 63.   She currently on Lipitor 20 mg p.o. nightly.  She is advised to continue.     4)  Weight/Diet: Her current BMI is increasing to 44.0--clearly complicating her diabetes care.  She is a candidate for modest weight loss.    -- Whole food plant-based diet, exercise, and detailed carbohydrates information provided, as well as Mounjaro at 15 mg subcutaneously weekly. -She is status post sleeve gastrectomy, previously Roux-en-Y gastric bypass.  5) hypervitaminosis B12 and D: She is advised to finish her current supplies of vitamin D2 50,000 units weekly and not refill.   -She is taking oral vitamin B12, labs showing supraphysiologic levels of B12.  She is advised to discontinue vitamin B12 supplements until next measurement.    6) Chronic Care/Health Maintenance:  -she  is on ACEI/ARB  medications and  is encouraged to continue to follow up with Ophthalmology, Dentist,  Podiatrist at least yearly or according to recommendations, and advised to  stay away from smoking. I have recommended yearly flu vaccine and pneumonia vaccination at least every 5 years; moderate intensity exercise for up to 150 minutes weekly; and  sleep for at least 7 hours a day.  - I advised patient to maintain close follow up with Leesa Pulling, MD for primary care needs.    I spent  41  minutes in the care of the patient today including review of labs from CMP, Lipids, Thyroid Function, Hematology (current and previous  including abstractions from other facilities); face-to-face time discussing  her blood glucose readings/logs, discussing hypoglycemia and hyperglycemia episodes and symptoms, medications doses, her options of short and  long term treatment based on the latest standards of care / guidelines;  discussion about incorporating lifestyle medicine;  and documenting the encounter. Risk reduction counseling performed per USPSTF guidelines to reduce  obesity and cardiovascular risk factors.     Please refer to Patient Instructions for Blood Glucose Monitoring and Insulin /Medications Dosing Guide"  in media tab for additional information. Please  also refer to " Patient Self Inventory" in the Media  tab for reviewed elements of pertinent patient history.  Whitney Hams participated in the discussions, expressed understanding, and voiced agreement with the above plans.  All questions were answered to her satisfaction. she is encouraged to contact clinic should she have any questions or concerns prior to her return visit.     Follow up plan: - Return in about 6 months (around 08/03/2024) for Bring Meter/CGM Device/Logs- A1c in Office.  Kalvin Orf, MD Phone: [phone]  Fax: [phone]   02/01/2024, 4:06 PM This note was partially dictated with voice recognition software. Similar sounding words can be transcribed inadequately or may not  be corrected upon review.

## 2024-02-02 ENCOUNTER — Other Ambulatory Visit: Payer: Self-pay

## 2024-02-02 DIAGNOSIS — E1165 Type 2 diabetes mellitus with hyperglycemia: Secondary | ICD-10-CM

## 2024-02-02 MED ORDER — ONETOUCH VERIO VI STRP: ORAL_STRIP | 1 refills | Status: AC

## 2024-02-02 MED ORDER — BLOOD GLUCOSE MONITOR KIT: PACK | 0 refills | Status: AC

## 2024-02-02 MED ORDER — LANCETS THIN MISC: 1.0000 | Freq: Four times a day (QID) | 1 refills | Status: AC

## 2024-02-07 ENCOUNTER — Telehealth: Payer: Self-pay

## 2024-02-07 NOTE — Telephone Encounter (Signed)
 Left a message requesting pt return call to the office.

## 2024-02-07 NOTE — Telephone Encounter (Signed)
 Pt called stating she received steroid injections in both knees Monday. States her glucose has been elevated since. States average glucose is 303. Pt is taking Tresiba  50 units at bedtime and Mounjaro 15mg  weekly.

## 2024-02-07 NOTE — Telephone Encounter (Signed)
 Spoke with pt advising her to increase her Tresiba  to 70 units at bedtime per Dr.Nida's orders. Due to speaking with pt so late in the day she did not take the extra 20 units.

## 2024-02-26 ENCOUNTER — Other Ambulatory Visit: Payer: Self-pay | Admitting: Nurse Practitioner

## 2024-02-26 DIAGNOSIS — E1165 Type 2 diabetes mellitus with hyperglycemia: Secondary | ICD-10-CM

## 2024-02-27 ENCOUNTER — Other Ambulatory Visit: Payer: Self-pay | Admitting: "Endocrinology

## 2024-02-27 ENCOUNTER — Telehealth: Payer: Self-pay | Admitting: *Deleted

## 2024-02-27 DIAGNOSIS — E1165 Type 2 diabetes mellitus with hyperglycemia: Secondary | ICD-10-CM

## 2024-02-27 MED ORDER — TRESIBA FLEXTOUCH 200 UNIT/ML ~~LOC~~ SOPN
70.0000 [IU] | PEN_INJECTOR | Freq: Every day | SUBCUTANEOUS | 2 refills | Status: DC
Start: 2024-02-27 — End: 2024-07-17

## 2024-02-27 NOTE — Telephone Encounter (Signed)
 Patient returned call. She states that her average blood sugar is 198. She does get the injections every 3 months in her knees. She states that her blood sugars are out the roof. She is not administering the 70 units. Last night she administered 50 units , the night before 40 units, and the night before that she administered 70 units. [name] [name] says it really depends on what her blood sugar is at bedtime.

## 2024-02-27 NOTE — Telephone Encounter (Signed)
 Patient was called and a message was left for the patient. We would like for her to bring by her CGM to make sure of her blood glucose readings. Patient was originally on Tresiba  60 units at night , then on Feb 01, 2024 it was changed to Tresiba  50 units at night. On Feb 07, 2024 patient talked with Joy. She shared that she was given injections of Steroid in her knees, at that time per Dr. Monte Antonio the patient was increased to 70 units of Tresiba  at night.  We are wanting to download her CGM to see her readings to make correct decision on her insulin  dosage.

## 2024-02-27 NOTE — Telephone Encounter (Signed)
 Left a message requesting pt return call to the office.

## 2024-02-28 NOTE — Telephone Encounter (Signed)
 Spoke with pt advising her to continue taking Tresiba  70 units at bedtime per Dr.Nida since her BG average is around 198. Pt voiced understanding. Rx for Tresiba  70 units sent to [facility].

## 2024-02-28 NOTE — Telephone Encounter (Signed)
 This was addressed and a note was sent to Sudie Ely ,LPN

## 2024-03-04 ENCOUNTER — Other Ambulatory Visit: Payer: Self-pay | Admitting: "Endocrinology

## 2024-03-09 LAB — HM DIABETES EYE EXAM

## 2024-07-17 ENCOUNTER — Other Ambulatory Visit: Payer: Self-pay | Admitting: "Endocrinology

## 2024-07-17 DIAGNOSIS — E1165 Type 2 diabetes mellitus with hyperglycemia: Secondary | ICD-10-CM

## 2024-08-05 ENCOUNTER — Encounter: Payer: Self-pay | Admitting: "Endocrinology

## 2024-08-05 ENCOUNTER — Ambulatory Visit: Admitting: "Endocrinology

## 2024-08-05 VITALS — BP 156/88 | HR 72 | Ht 66.0 in | Wt 283.8 lb

## 2024-08-05 DIAGNOSIS — Z794 Long term (current) use of insulin: Secondary | ICD-10-CM | POA: Diagnosis not present

## 2024-08-05 DIAGNOSIS — I10 Essential (primary) hypertension: Secondary | ICD-10-CM

## 2024-08-05 DIAGNOSIS — E782 Mixed hyperlipidemia: Secondary | ICD-10-CM | POA: Diagnosis not present

## 2024-08-05 DIAGNOSIS — E1165 Type 2 diabetes mellitus with hyperglycemia: Secondary | ICD-10-CM

## 2024-08-05 LAB — POCT GLYCOSYLATED HEMOGLOBIN (HGB A1C): HbA1c, POC (controlled diabetic range): 7.7 % — AB (ref 0.0–7.0)

## 2024-08-05 MED ORDER — TRESIBA FLEXTOUCH 200 UNIT/ML ~~LOC~~ SOPN: 60.0000 [IU] | PEN_INJECTOR | Freq: Every day | SUBCUTANEOUS | 2 refills | Status: AC

## 2024-08-05 NOTE — Patient Instructions (Signed)

## 2024-08-05 NOTE — Progress Notes (Signed)
 08/05/2024  Endocrinology follow-up note   Subjective:    Patient ID: [name] [name], female    DOB: [birth year].  she is being seen in follow-up for the  management of currently uncontrolled symptomatic diabetes requested by  Toribio Jerel MATSU, MD.   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   ASCA 4.3 NL pANCA 7.4   Obesity, Class III, BMI 40-49.9 (morbid obesity) (HCC) AUG 2010 305 LBS   MAY 2012 267   Vitamin D  deficiency    Past Surgical History:  Procedure Laterality Date   ABDOMINAL EXPLORATION SURGERY  May 05, 2009 MM   INFLAMMED TI (SEROSITIS), DIDN'T LOOK LIKE CROHN'S   CHOLECYSTECTOMY     COLONOSCOPY  DEC 2010 WO   COLONOSCOPY  OCT 2011   MILD ERYTHEMA IN TI (30-40 CM), NL SB/COLON Bx   COLONOSCOPY WITH PROPOFOL  N/A 06/09/2023   Procedure: COLONOSCOPY WITH PROPOFOL ;  Surgeon: Cindie Carlin POUR, DO;  Location: [facility];  Service: Endoscopy;  Laterality: N/A;  10:45 am, asa 3   ESOPHAGOGASTRODUODENOSCOPY N/A 04/14/2014   Procedure: ESOPHAGOGASTRODUODENOSCOPY (EGD);  Surgeon: Margo LITTIE Haddock, MD;  Location: [facility];  Service: Endoscopy;  Laterality: N/A;  8:30 AM   ESOPHAGOGASTRODUODENOSCOPY N/A 05/11/2020   Procedure: UPPER GASTROINTESTINAL ENDOSCOPY;  Surgeon: Gladis Cough, MD;  Location: [facility];  Service: General;  Laterality: N/A;   LAPAROSCOPIC GASTRIC BANDING  AUG 2010 BMI 50   GASTRIC POUCH 2-3 CM   LAPAROSCOPIC GASTRIC SLEEVE RESECTION N/A 05/11/2020   Procedure: LAPAROSCOPIC SLEEVE GASTRECTOMY;  Surgeon: Gladis Cough, MD;  Location: [facility];  Service: General;  Laterality: N/A;   UMBILICAL HERNIA REPAIR  AUG 2010 MATTHEW MARTIN   INCARCERATED OMENTUM   UPPER GASTROINTESTINAL ENDOSCOPY  MAY 2012 GIV[facility] [employer] [employer]EMENT   GASTRITIS    Social History   Socioeconomic History   Marital status: Divorced    Spouse name: Not on file   Number of children: Not on file   Years of education: Not on file   Highest education level: Not on file  Occupational History   Not on file  Tobacco Use   Smoking status: Never   Smokeless tobacco: Never   Tobacco comments:    Never smoked  Vaping Use   Vaping status: Never Used  Substance and Sexual Activity   Alcohol use: No   Drug use: No   Sexual activity: Not on file  Other Topics Concern   Not on file  Social History Narrative   MARRIED, NO KIDS   Social Drivers of Health   Financial Resource Strain: Not on file  Food Insecurity: Not on file  Transportation Needs: Not on file  Physical Activity: Inactive (05/26/2023)   [facility]   Exercise Vital Sign    On average, how many days per week do you engage in moderate to strenuous exercise (like a brisk walk)?: 0 days    On average, how many minutes do you engage in exercise at this level?: 0 min  Stress: No Stress Concern Present (05/26/2023)   Received from [facility] of Occupational Health - Occupational Stress Questionnaire    Feeling of Stress : Not at all  Social Connections: Not on file   Outpatient Encounter Medications as of 08/05/2024  Medication Sig   [DISCONTINUED] TRESIBA  FLEXTOUCH 200 UNIT/ML FlexTouch Pen ADMINISTER 70 UNITS UNDER THE SKIN AT BEDTIME (Patient taking differently: 50 Units at bedtime.)   AMLODIPINE  BENZOATE PO Take 5 mg by mouth daily.   atorvastatin (LIPITOR) 10 MG tablet Take 10 mg by mouth daily.   blood glucose meter kit and supplies KIT Dispense based on patient and insurance preference. Use to check glucose four times daily as directed.   Continuous Glucose Receiver (FREESTYLE LIBRE 3 READER) DEVI 1 Piece by Does not apply route once as needed for up to 1 dose.   Continuous Glucose Sensor (FREESTYLE LIBRE 3 PLUS SENSOR) MISC APPLY 1 SENSOR  AND CHANGE EVERY 15 DAYS   DULoxetine (CYMBALTA) 30 MG capsule Take 30 mg by mouth daily.   gabapentin  (NEURONTIN ) 300 MG capsule Take 600 mg by mouth at bedtime.   glucose blood (ONETOUCH VERIO) test strip USE AS INSTRUCTED TO TEST BLOOD SUGAR FOUR TIMES DAILY   insulin  degludec (TRESIBA  FLEXTOUCH) 200 UNIT/ML FlexTouch Pen Inject 60 Units into the skin at bedtime.   Insulin  Pen Needle (B-D UF III MINI PEN NEEDLES) 31G X 5 MM MISC USE AS DIRECTED WITH INSULIN  INJECTIONS QHS   Lancets Thin MISC 1 each by Does not apply route 4 (four) times daily. Use to test blood glucose 4 times daily   lisinopril  (PRINIVIL ,ZESTRIL ) 40 MG tablet Take 40 mg by mouth daily.   loratadine (CLARITIN) 10 MG tablet Take 10 mg by mouth daily.   meloxicam (MOBIC) 15 MG tablet Take 15 mg by mouth daily.   metoprolol  succinate (TOPROL -XL) 25 MG 24 hr tablet Take 25 mg by mouth daily.   pantoprazole  (PROTONIX ) 40 MG tablet Take 1 tablet (40 mg total) by mouth daily.   potassium chloride  (K-DUR) 10 MEQ tablet Take 10 mEq by mouth daily as needed (Take only whe take Torsemide).    tirzepatide (MOUNJARO) 15 MG/0.5ML Pen Inject 15 mg into the skin once a week.   torsemide (DEMADEX) 20 MG tablet Take 20 mg by mouth daily as needed (Excess fluid).   No facility-administered encounter medications on file as of 08/05/2024.    ALLERGIES: No Known Allergies  VACCINATION STATUS:  There is no immunization history on file for this patient.  Diabetes She presents for her follow-up diabetic visit. She has type 2 diabetes mellitus. Onset time: She was diagnosed at approximate age of 58 years. Her disease course has been worsening. There are no hypoglycemic associated symptoms. Pertinent negatives for hypoglycemia include no confusion, headaches, pallor or seizures. Pertinent negatives for diabetes include no chest pain, no fatigue and no polyphagia. There are no hypoglycemic complications. Symptoms are worsening. There are no  diabetic complications. Risk factors for coronary artery disease include diabetes mellitus, dyslipidemia, family history, obesity, hypertension and sedentary lifestyle. Her weight is increasing steadily (She is status post sleeve gastrectomy, regaining weight after she lost 60pounds after her recent surgery.). She is following a generally unhealthy diet. When asked about meal planning, she reported none. She has not had a previous visit with a dietitian. She rarely participates in exercise. Her home blood glucose trend is increasing steadily. Her breakfast blood glucose range is generally 140-180 mg/dl. Her lunch blood glucose range is generally 140-180 mg/dl. Her dinner blood glucose range is generally 140-180 mg/dl. Her bedtime blood glucose range is generally 140-180 mg/dl. Her overall blood glucose range is 140-180 mg/dl. (Ms. Koppel  presents with her CGM device showing fluctuating glycemic profile, 55% time in range, 35% level 1 hyperglycemia, 8% level 2 hyper her average blood glucose 174 mg/dl.  Her point-of-care A1c is 7.7%, progressively increasing from 6.5%.   ) An ACE inhibitor/angiotensin II receptor blocker is being taken. Eye exam is current.  Hypertension This is a chronic problem. The current episode started more than 1 year ago. The problem is controlled. Pertinent negatives include no chest pain, headaches, palpitations or shortness of breath. Risk factors for coronary artery disease include diabetes mellitus, obesity, sedentary lifestyle, family history and dyslipidemia. Past treatments include ACE inhibitors.    Review of systems Lost 45 pounds since her bariatric surgery.  Objective:    BP (!) 156/88   Pulse 72   Ht 5' 6 (1.676 m)   Wt 283 lb 12.8 oz (128.7 kg)   LMP 11/01/2015   BMI 45.81 kg/m   Wt Readings from Last 3 Encounters:  08/05/24 283 lb 12.8 oz (128.7 kg)  02/01/24 272 lb 9.6 oz (123.7 kg)  08/30/23 268 lb 6.4 oz (121.7 kg)      CMP     Component Value  Date/Time   NA 142 12/26/2023 0805   K 5.2 12/26/2023 0805   CL 106 12/26/2023 0805   CO2 22 12/26/2023 0805   GLUCOSE 148 (H) 12/26/2023 0805   GLUCOSE 185 (H) 04/03/2023 1202   BUN 16 12/26/2023 0805   CREATININE 0.74 12/26/2023 0805   CREATININE 0.67 07/08/2019 0710   CALCIUM 9.5 12/26/2023 0805   PROT 6.1 12/26/2023 0805   ALBUMIN  3.8 12/26/2023 0805   AST 16 12/26/2023 0805   ALT 16 12/26/2023 0805   ALKPHOS 105 12/26/2023 0805   BILITOT 0.3 12/26/2023 0805   GFRNONAA >60 04/03/2023 1202   GFRNONAA 100 07/08/2019 0710   GFRAA >60 05/11/2020 1812   GFRAA 116 07/08/2019 0710    Diabetic Labs (most recent): Lab Results  Component Value Date   HGBA1C 7.7 (A) 08/05/2024   HGBA1C 7.1 (A) 02/01/2024   HGBA1C 6.5 08/30/2023   MICROALBUR 0.8 09/26/2018   MICROALBUR 2.2 09/08/2017   Lipid Panel     Component Value Date/Time   CHOL 140 12/26/2023 0805   TRIG 172 (H) 12/26/2023 0805   HDL 48 12/26/2023 0805   CHOLHDL 2.9 12/26/2023 0805   CHOLHDL 2.6 09/26/2018 0733   VLDL 44 (H) 07/17/2013 0701   LDLCALC 63 12/26/2023 0805   LDLCALC 73 09/26/2018 0733    Assessment & Plan:   1. Uncontrolled type 2 diabetes mellitus with hyperglycemia (HCC) - Patient has currently uncontrolled symptomatic type 2 DM since  58 years of age.  Ms. Sloop presents with her CGM device showing fluctuating glycemic profile, 55% time in range, 35% level 1 hyperglycemia, 8% level 2 hyper her average blood glucose 174 mg/dl.  Her point-of-care A1c is 7.7%, progressively increasing from 6.5%.    -Recent labs reviewed.  -her diabetes is complicated by obesity/sedentary life and Murray R Himes remains at a high risk for more acute and chronic complications which include CAD, CVA, CKD, retinopathy, and neuropathy. These are all discussed in detail with the patient.  - I have counseled her on diet management and weight loss, by adopting a carbohydrate restricted/protein rich diet.  - she  acknowledges that there is a room for improvement in her food and drink choices. - Suggestion is made for her to avoid simple carbohydrates  from her diet including Cakes, Sweet Desserts, Ice Cream,  Soda (diet and regular), Sweet Tea, Candies, Chips, Cookies, Store Bought Juices, Alcohol , Artificial Sweeteners,  Coffee Creamer, and Sugar-free Products, Lemonade. This will help patient to have more stable blood glucose profile and potentially avoid unintended weight gain.  The following Lifestyle Medicine recommendations according to American College of Lifestyle Medicine  [facility]) were discussed and and offered to patient and she  agrees to start the journey:  A. Whole Foods, Plant-Based Nutrition comprising of fruits and vegetables, plant-based proteins, whole-grain carbohydrates was discussed in detail with the patient.   A list for source of those nutrients were also provided to the patient.  Patient will use only water  or unsweetened tea for hydration. B.  The need to stay away from risky substances including alcohol, smoking; obtaining 7 to 9 hours of restorative sleep, at least 150 minutes of moderate intensity exercise weekly, the importance of healthy social connections,  and stress management techniques were discussed. C.  A full color page of  Calorie density of various food groups per pound showing examples of each food groups was provided to the patient.   - I have approached her with the following individualized plan to manage diabetes and patient agrees:   -   Based on her current glycemic profile, she will not need prandial insulin  for now.  However she will need a higher dose of basal insulin .  I discussed and increase her Tresiba  to 60 units nightly.  She is encouraged to stay on Mounjaro 15 mg weekly.    Side effects and precautions discussed with her.     - Patient specific target  A1c;  LDL, HDL, Triglycerides, were discussed in detail.  2) BP/HTN: Her blood pressure is not  controlled to target.  She is advised to continue her current medications including lisinopril  40 mg p.o. daily at breakfast.    3) Lipids/HPL: Her recent lipid panel showed controlled LDL at 63.   She is currently on Lipitor 20 mg p.o. nightly.  Advised to continue.   4)  Weight/Diet: Her current BMI is increasing to 45.81 kg/m -clearly complicating her diabetes care.  She is a candidate for modest weight loss.    -- Whole food plant-based diet, exercise, and detailed carbohydrates information provided, as well as Mounjaro at 15 mg subcutaneously weekly. -She is status post sleeve gastrectomy, previously Roux-en-Y gastric bypass.  5) hypervitaminosis B12 and D: She is advised to finish her current supplies of vitamin D2 50,000 units weekly and not refill.   -She is taking oral vitamin B12, labs showing supraphysiologic levels of B12.  She is advised to discontinue vitamin B12 supplements until next measurement.    6) Chronic Care/Health Maintenance:  -she  is on ACEI/ARB  medications and  is encouraged to continue to follow up with Ophthalmology, Dentist,  Podiatrist at least yearly or according to recommendations, and advised to  stay away from smoking. I have recommended yearly flu vaccine and pneumonia vaccination at least every 5 years; moderate intensity exercise for up to 150 minutes weekly; and  sleep for at least 7 hours a day.  His foot exam was normal on August 05, 2024 except for dry skin to bilateral feet.  - I advised patient to maintain close follow up with Toribio Jerel MATSU, MD for primary care needs.     I spent  26  minutes in the care of the patient today including review of labs from CMP, Lipids, Thyroid Function, Hematology (current and previous including abstractions from  other facilities); face-to-face time discussing  her blood glucose readings/logs, discussing hypoglycemia and hyperglycemia episodes and symptoms, medications doses, her options of short and long term  treatment based on the latest standards of care / guidelines;  discussion about incorporating lifestyle medicine;  and documenting the encounter. Risk reduction counseling performed per USPSTF guidelines to reduce obesity and cardiovascular risk factors.     Please refer to Patient Instructions for Blood Glucose Monitoring and Insulin /Medications Dosing Guide  in media tab for additional information. Please  also refer to  Patient Self Inventory in the Media  tab for reviewed elements of pertinent patient history.  Garrison JONELLE Lewis participated in the discussions, expressed understanding, and voiced agreement with the above plans.  All questions were answered to her satisfaction. she is encouraged to contact clinic should she have any questions or concerns prior to her return visit.     Follow up plan: - Return in about 4 months (around 12/03/2024) for F/U with Pre-visit Labs, Meter/CGM/Logs, A1c here.  Ranny Earl, MD Phone: [phone]  Fax: [phone]   08/05/2024, 4:54 PM This note was partially dictated with voice recognition software. Similar sounding words can be transcribed inadequately or may not  be corrected upon review.

## 2024-10-21 ENCOUNTER — Other Ambulatory Visit: Payer: Self-pay | Admitting: "Endocrinology

## 2024-10-29 ENCOUNTER — Encounter: Admitting: Nutrition

## 2024-12-05 ENCOUNTER — Ambulatory Visit: Admitting: "Endocrinology
